# Patient Record
Sex: Female | Born: 1940 | Race: White | Hispanic: No | Smoking: Former smoker
Health system: Southern US, Community
[De-identification: ages and names within clinical notes are randomized; demographics above are authoritative.]

## PROBLEM LIST (undated history)

## (undated) DIAGNOSIS — J45909 Unspecified asthma, uncomplicated: Secondary | ICD-10-CM

## (undated) DIAGNOSIS — K219 Gastro-esophageal reflux disease without esophagitis: Secondary | ICD-10-CM

## (undated) DIAGNOSIS — E785 Hyperlipidemia, unspecified: Secondary | ICD-10-CM

## (undated) DIAGNOSIS — K5792 Diverticulitis of intestine, part unspecified, without perforation or abscess without bleeding: Secondary | ICD-10-CM

## (undated) DIAGNOSIS — D649 Anemia, unspecified: Secondary | ICD-10-CM

## (undated) DIAGNOSIS — I10 Essential (primary) hypertension: Secondary | ICD-10-CM

## (undated) DIAGNOSIS — R413 Other amnesia: Secondary | ICD-10-CM

## (undated) DIAGNOSIS — F32A Depression, unspecified: Secondary | ICD-10-CM

## (undated) DIAGNOSIS — F419 Anxiety disorder, unspecified: Secondary | ICD-10-CM

## (undated) DIAGNOSIS — F0390 Unspecified dementia without behavioral disturbance: Secondary | ICD-10-CM

## (undated) DIAGNOSIS — F0391 Unspecified dementia with behavioral disturbance: Secondary | ICD-10-CM

## (undated) DIAGNOSIS — F329 Major depressive disorder, single episode, unspecified: Secondary | ICD-10-CM

## (undated) DIAGNOSIS — G9340 Encephalopathy, unspecified: Secondary | ICD-10-CM

## (undated) DIAGNOSIS — E079 Disorder of thyroid, unspecified: Secondary | ICD-10-CM

## (undated) DIAGNOSIS — M1990 Unspecified osteoarthritis, unspecified site: Secondary | ICD-10-CM

## (undated) DIAGNOSIS — K859 Acute pancreatitis without necrosis or infection, unspecified: Secondary | ICD-10-CM

## (undated) DIAGNOSIS — J449 Chronic obstructive pulmonary disease, unspecified: Secondary | ICD-10-CM

## (undated) DIAGNOSIS — M810 Age-related osteoporosis without current pathological fracture: Secondary | ICD-10-CM

## (undated) DIAGNOSIS — K2270 Barrett's esophagus without dysplasia: Secondary | ICD-10-CM

## (undated) DIAGNOSIS — I480 Paroxysmal atrial fibrillation: Secondary | ICD-10-CM

## (undated) DIAGNOSIS — E559 Vitamin D deficiency, unspecified: Secondary | ICD-10-CM

## (undated) DIAGNOSIS — N189 Chronic kidney disease, unspecified: Secondary | ICD-10-CM

## (undated) DIAGNOSIS — R42 Dizziness and giddiness: Secondary | ICD-10-CM

## (undated) DIAGNOSIS — L409 Psoriasis, unspecified: Secondary | ICD-10-CM

## (undated) HISTORY — DX: Unspecified dementia with behavioral disturbance: F03.91

## (undated) HISTORY — DX: Gastro-esophageal reflux disease without esophagitis: K21.9

## (undated) HISTORY — DX: Anemia, unspecified: D64.9

## (undated) HISTORY — DX: Paroxysmal atrial fibrillation: I48.0

## (undated) HISTORY — PX: CHOLECYSTECTOMY: SHX55

## (undated) HISTORY — DX: Vitamin D deficiency, unspecified: E55.9

## (undated) HISTORY — DX: Barrett's esophagus without dysplasia: K22.70

## (undated) HISTORY — DX: Psoriasis, unspecified: L40.9

## (undated) HISTORY — DX: Chronic obstructive pulmonary disease, unspecified: J44.9

## (undated) HISTORY — PX: SHOULDER SURGERY: SHX246

## (undated) HISTORY — DX: Encephalopathy, unspecified: G93.40

## (undated) HISTORY — DX: Chronic kidney disease, unspecified: N18.9

## (undated) HISTORY — PX: ROTATOR CUFF REPAIR: SHX139

## (undated) HISTORY — PX: BACK SURGERY: SHX140

## (undated) HISTORY — DX: Disorder of thyroid, unspecified: E07.9

## (undated) HISTORY — DX: Age-related osteoporosis without current pathological fracture: M81.0

## (undated) HISTORY — DX: Other amnesia: R41.3

## (undated) HISTORY — DX: Anxiety disorder, unspecified: F41.9

## (undated) HISTORY — PX: OTHER SURGICAL HISTORY: SHX169

## (undated) HISTORY — DX: Essential (primary) hypertension: I10

---

## 2003-06-03 ENCOUNTER — Other Ambulatory Visit: Admission: RE | Admit: 2003-06-03 | Discharge: 2003-06-03 | Payer: Self-pay | Admitting: *Deleted

## 2007-04-14 ENCOUNTER — Encounter
Admission: RE | Admit: 2007-04-14 | Discharge: 2007-04-14 | Payer: Self-pay | Admitting: Physical Medicine and Rehabilitation

## 2007-11-01 ENCOUNTER — Emergency Department (HOSPITAL_BASED_OUTPATIENT_CLINIC_OR_DEPARTMENT_OTHER): Admission: EM | Admit: 2007-11-01 | Discharge: 2007-11-01 | Payer: Self-pay | Admitting: Emergency Medicine

## 2010-11-12 LAB — D-DIMER, QUANTITATIVE: D-Dimer, Quant: 0.33

## 2013-08-20 ENCOUNTER — Emergency Department (HOSPITAL_BASED_OUTPATIENT_CLINIC_OR_DEPARTMENT_OTHER)
Admission: EM | Admit: 2013-08-20 | Discharge: 2013-08-20 | Disposition: A | Discharge status: Home / Self Care | Payer: Medicare Other | Attending: Emergency Medicine | Admitting: Emergency Medicine

## 2013-08-20 ENCOUNTER — Encounter (HOSPITAL_BASED_OUTPATIENT_CLINIC_OR_DEPARTMENT_OTHER): Payer: Self-pay | Admitting: Emergency Medicine

## 2013-08-20 DIAGNOSIS — Z8739 Personal history of other diseases of the musculoskeletal system and connective tissue: Secondary | ICD-10-CM | POA: Insufficient documentation

## 2013-08-20 DIAGNOSIS — L0390 Cellulitis, unspecified: Secondary | ICD-10-CM

## 2013-08-20 DIAGNOSIS — L02219 Cutaneous abscess of trunk, unspecified: Secondary | ICD-10-CM | POA: Insufficient documentation

## 2013-08-20 DIAGNOSIS — L03319 Cellulitis of trunk, unspecified: Principal | ICD-10-CM

## 2013-08-20 DIAGNOSIS — Z792 Long term (current) use of antibiotics: Secondary | ICD-10-CM | POA: Insufficient documentation

## 2013-08-20 DIAGNOSIS — E785 Hyperlipidemia, unspecified: Secondary | ICD-10-CM | POA: Insufficient documentation

## 2013-08-20 DIAGNOSIS — Z79899 Other long term (current) drug therapy: Secondary | ICD-10-CM | POA: Insufficient documentation

## 2013-08-20 DIAGNOSIS — J45909 Unspecified asthma, uncomplicated: Secondary | ICD-10-CM | POA: Insufficient documentation

## 2013-08-20 DIAGNOSIS — Z791 Long term (current) use of non-steroidal anti-inflammatories (NSAID): Secondary | ICD-10-CM | POA: Insufficient documentation

## 2013-08-20 DIAGNOSIS — Z8719 Personal history of other diseases of the digestive system: Secondary | ICD-10-CM | POA: Insufficient documentation

## 2013-08-20 DIAGNOSIS — Z87891 Personal history of nicotine dependence: Secondary | ICD-10-CM | POA: Insufficient documentation

## 2013-08-20 DIAGNOSIS — L0291 Cutaneous abscess, unspecified: Secondary | ICD-10-CM

## 2013-08-20 HISTORY — DX: Unspecified asthma, uncomplicated: J45.909

## 2013-08-20 HISTORY — DX: Dizziness and giddiness: R42

## 2013-08-20 HISTORY — DX: Unspecified osteoarthritis, unspecified site: M19.90

## 2013-08-20 HISTORY — DX: Hyperlipidemia, unspecified: E78.5

## 2013-08-20 HISTORY — DX: Acute pancreatitis without necrosis or infection, unspecified: K85.90

## 2013-08-20 HISTORY — DX: Diverticulitis of intestine, part unspecified, without perforation or abscess without bleeding: K57.92

## 2013-08-20 MED ORDER — MUPIROCIN CALCIUM 2 % NA OINT: TOPICAL_OINTMENT | NASAL | Status: DC

## 2013-08-20 MED ORDER — CLINDAMYCIN HCL 150 MG PO CAPS: 300.0000 mg | ORAL_CAPSULE | Freq: Four times a day (QID) | ORAL | Status: DC

## 2013-08-20 NOTE — ED Notes (Signed)
I & D tray at the bedside set up and ready for the doctor to use.

## 2013-08-20 NOTE — Discharge Instructions (Signed)
Abscess An abscess is an infected area that contains a collection of pus and debris.It can occur in almost any part of the body. An abscess is also known as a furuncle or boil.  You should use warm compresses or soaks to the area. CAUSES  An abscess occurs when tissue gets infected. This can occur from blockage of oil or sweat glands, infection of hair follicles, or a minor injury to the skin. As the body tries to fight the infection, pus collects in the area and creates pressure under the skin. This pressure causes pain. People with weakened immune systems have difficulty fighting infections and get certain abscesses more often.  SYMPTOMS Usually an abscess develops on the skin and becomes a painful mass that is red, warm, and tender. If the abscess forms under the skin, you may feel a moveable soft area under the skin. Some abscesses break open (rupture) on their own, but most will continue to get worse without care. The infection can spread deeper into the body and eventually into the bloodstream, causing you to feel ill.  DIAGNOSIS  Your caregiver will take your medical history and perform a physical exam. A sample of fluid may also be taken from the abscess to determine what is causing your infection. TREATMENT  Your caregiver may prescribe antibiotic medicines to fight the infection. However, taking antibiotics alone usually does not cure an abscess. Your caregiver may need to make a small cut (incision) in the abscess to drain the pus. In some cases, gauze is packed into the abscess to reduce pain and to continue draining the area. HOME CARE INSTRUCTIONS   Only take over-the-counter or prescription medicines for pain, discomfort, or fever as directed by your caregiver.  If you were prescribed antibiotics, take them as directed. Finish them even if you start to feel better.  If gauze is used, follow your caregiver's directions for changing the gauze.  To avoid spreading the  infection:  Keep your draining abscess covered with a bandage.  Wash your hands well.  Do not share personal care items, towels, or whirlpools with others.  Avoid skin contact with others.  Keep your skin and clothes clean around the abscess.  Keep all follow-up appointments as directed by your caregiver. SEEK MEDICAL CARE IF:   You have increased pain, swelling, redness, fluid drainage, or bleeding.  You have muscle aches, chills, or a general ill feeling.  You have a fever. MAKE SURE YOU:   Understand these instructions.  Will watch your condition.  Will get help right away if you are not doing well or get worse. Document Released: 11/07/2004 Document Revised: 07/30/2011 Document Reviewed: 04/12/2011 [facility] Patient Information 2015 [employer] [place]. This information is not intended to replace advice given to you by your health care provider. Make sure you discuss any questions you have with your health care provider.  Cellulitis Cellulitis is an infection of the skin and the tissue under the skin. The infected area is usually red and tender. This happens most often in the arms and lower legs. HOME CARE   Take your antibiotic medicine as told. Finish the medicine even if you start to feel better.  Keep the infected arm or leg raised (elevated).  Put a warm cloth on the area up to 4 times per day.  Only take medicines as told by your doctor.  Keep all doctor visits as told. GET HELP RIGHT AWAY IF:   You have a fever.  You feel very sleepy.  You throw up (vomit) or have watery poop (diarrhea).  You feel sick and have muscle aches and pains.  You see red streaks on the skin coming from the infected area.  Your red area gets bigger or turns a dark color.  Your bone or joint under the infected area is painful after the skin heals.  Your infection comes back in the same area or different area.  You have a puffy (swollen) bump in the infected area.  You have  new symptoms. MAKE SURE YOU:   Understand these instructions.  Will watch your condition.  Will get help right away if you are not doing well or get worse. Document Released: 07/17/2007 Document Revised: 07/30/2011 Document Reviewed: 04/15/2011 [facility] Patient Information 2015 [place], [place]. This information is not intended to replace advice given to you by your health care provider. Make sure you discuss any questions you have with your health care provider.

## 2013-08-20 NOTE — ED Notes (Signed)
Pt c/o several abscesses x 2 weeks

## 2013-08-20 NOTE — ED Provider Notes (Signed)
CSN: [id number]     Arrival date & time 08/20/13  1306 History   First MD Initiated Contact with Patient 08/20/13 1325     Chief Complaint  Patient presents with  . Abscess     (Consider location/radiation/quality/duration/timing/severity/associated sxs/prior Treatment) HPI  This is a 73 year old female with history of hyperlipidemia, asthma who presents with multiple abscesses over the last one week. No prior history of the same. Patient reports that she initially had a spot on her lip which she treated conservatively. She now states that she has a spot on her back and her labia. They appear to be getting bigger.  Patient denies any systemic symptoms including fever. She has associated pain and redness to the site. She's not tried anything for them.  Past Medical History  Diagnosis Date  . Hyperlipemia   . Asthma   . Diverticulitis   . Arthritis   . Vertigo   . Pancreatitis    Past Surgical History  Procedure Laterality Date  . Brain shun    . Rotator cuff repair     History reviewed. No pertinent family history. History  Substance Use Topics  . Smoking status: Former Games developermoker  . Smokeless tobacco: Not on file  . Alcohol Use: No   OB History   Grav Para Term Preterm Abortions TAB SAB Ect Mult Living                 Review of Systems  Constitutional: Negative for fever.  Skin:       Multiple abscess  All other systems reviewed and are negative.     Allergies  Cephalexin; Cephalosporins; Ciprofloxacin; Darvon; Flagyl; Librax; Macrodantin; Percocet; and Sulfa antibiotics  Home Medications   Prior to Admission medications   Medication Sig Start Date End Date Taking? Authorizing Provider  amoxicillin-clavulanate (AUGMENTIN) 875-125 MG per tablet Take 1 tablet by mouth 2 (two) times daily.   Yes Historical Provider, MD  meclizine (ANTIVERT) 25 MG tablet Take 25 mg by mouth 3 (three) times daily as needed for dizziness.   Yes Historical Provider, MD  meloxicam  (MOBIC) 7.5 MG tablet Take 7.5 mg by mouth daily.   Yes Historical Provider, MD  pantoprazole (PROTONIX) 40 MG tablet Take 40 mg by mouth daily.   Yes Historical Provider, MD  simvastatin (ZOCOR) 20 MG tablet Take 20 mg by mouth daily.   Yes Historical Provider, MD  theophylline (THEODUR) 300 MG 12 hr tablet Take 300 mg by mouth 2 (two) times daily.   Yes Historical Provider, MD  clindamycin (CLEOCIN) 150 MG capsule Take 2 capsules (300 mg total) by mouth every 6 (six) hours. 08/20/13   Shon Batonourtney F Horton, MD  mupirocin nasal ointment (BACTROBAN) 2 % Apply in each nostril daily 08/20/13   Shon Batonourtney F Horton, MD   BP 98/56  Pulse 87  Temp(Src) 97.8 F (36.6 C)  Resp 20  Ht 5\' 2"  (1.575 m)  Wt 165 lb (74.844 kg)  BMI 30.17 kg/m2  SpO2 96% Physical Exam  Nursing note and vitals reviewed. Constitutional: She is oriented to person, place, and time. No distress.  Elderly  HENT:  Head: Normocephalic and atraumatic.  Mouth/Throat: Oropharynx is clear and moist.  Cardiovascular: Normal rate, regular rhythm and normal heart sounds.   Pulmonary/Chest: Effort normal and breath sounds normal. No respiratory distress. She has no wheezes.  Abdominal: Soft.  Genitourinary:  Quarter-sized area of redness and induration without fluctuance over the left labia  Neurological: She is alert  and oriented to person, place, and time.  Skin: Skin is warm and dry.  Pustule noted over the back with associated fluctuance or erythema, erythema extends approximately 6 x 3 cm  Psychiatric: She has a normal mood and affect.    ED Course  INCISION AND DRAINAGE Date/Time: 08/21/2013 7:04 AM Performed by: Ross Marcus, F Authorized by: Ross Marcus, F Consent: Verbal consent obtained. Risks and benefits: risks, benefits and alternatives were discussed Consent given by: patient Patient identity confirmed: verbally with patient Type: abscess Body area: trunk Location details: back Scalpel size:  11 Incision type: single straight Complexity: simple Drainage: purulent Drainage amount: moderate Wound treatment: wound left open Patient tolerance: Patient tolerated the procedure well with no immediate complications.   (including critical care time) Labs Review Labs Reviewed - No data to display  Imaging Review No results found.   EKG Interpretation None      MDM   Final diagnoses:  Abscess and cellulitis    Patient presents with multiple areas of redness and induration. She is nontoxic-appearing. She's afebrile. She appears to have abscesses come up over her right back. Region over her left labia appears more cellulitic at this time. Abscess was drained appropriately. Patient will be given a course of clindamycin given the associated cellulitis with both lesions. Patient will also be given Bactroban given occurrence of multiple abscesses over the last week. She was given strict return precautions. She is to use sitz bath and warm compresses.  After history, exam, and medical workup I feel the patient has been appropriately medically screened and is safe for discharge home. Pertinent diagnoses were discussed with the patient. Patient was given return precautions.    Shon Baton, MD 08/21/13 [phone]

## 2013-08-20 NOTE — ED Notes (Signed)
MD at bedside. 

## 2015-09-14 LAB — HM DEXA SCAN

## 2015-12-19 HISTORY — PX: UPPER GI ENDOSCOPY: SHX6162

## 2016-01-26 ENCOUNTER — Encounter (HOSPITAL_BASED_OUTPATIENT_CLINIC_OR_DEPARTMENT_OTHER): Payer: Self-pay | Admitting: *Deleted

## 2016-01-26 ENCOUNTER — Emergency Department (HOSPITAL_BASED_OUTPATIENT_CLINIC_OR_DEPARTMENT_OTHER): Payer: Medicare Other

## 2016-01-26 ENCOUNTER — Emergency Department (HOSPITAL_BASED_OUTPATIENT_CLINIC_OR_DEPARTMENT_OTHER)
Admission: EM | Admit: 2016-01-26 | Discharge: 2016-01-27 | Payer: Medicare Other | Attending: Emergency Medicine | Admitting: Emergency Medicine

## 2016-01-26 DIAGNOSIS — Z79899 Other long term (current) drug therapy: Secondary | ICD-10-CM | POA: Diagnosis not present

## 2016-01-26 DIAGNOSIS — E876 Hypokalemia: Secondary | ICD-10-CM | POA: Diagnosis not present

## 2016-01-26 DIAGNOSIS — Z87891 Personal history of nicotine dependence: Secondary | ICD-10-CM | POA: Insufficient documentation

## 2016-01-26 DIAGNOSIS — J45909 Unspecified asthma, uncomplicated: Secondary | ICD-10-CM | POA: Diagnosis not present

## 2016-01-26 DIAGNOSIS — K859 Acute pancreatitis without necrosis or infection, unspecified: Secondary | ICD-10-CM | POA: Diagnosis not present

## 2016-01-26 DIAGNOSIS — R1030 Lower abdominal pain, unspecified: Secondary | ICD-10-CM | POA: Diagnosis present

## 2016-01-26 HISTORY — DX: Depression, unspecified: F32.A

## 2016-01-26 HISTORY — DX: Major depressive disorder, single episode, unspecified: F32.9

## 2016-01-26 HISTORY — DX: Unspecified dementia without behavioral disturbance: F03.90

## 2016-01-26 LAB — URINALYSIS, ROUTINE W REFLEX MICROSCOPIC
Bilirubin Urine: NEGATIVE
Glucose, UA: NEGATIVE mg/dL
Hgb urine dipstick: NEGATIVE
Ketones, ur: NEGATIVE mg/dL
Leukocytes, UA: NEGATIVE
Nitrite: NEGATIVE
Protein, ur: NEGATIVE mg/dL
Specific Gravity, Urine: 1.03 (ref 1.005–1.030)
pH: 7 (ref 5.0–8.0)

## 2016-01-26 LAB — BASIC METABOLIC PANEL
Anion gap: 9 (ref 5–15)
BUN: 10 mg/dL (ref 6–20)
CO2: 30 mmol/L (ref 22–32)
Calcium: 9.6 mg/dL (ref 8.9–10.3)
Chloride: 100 mmol/L — ABNORMAL LOW (ref 101–111)
Creatinine, Ser: 0.83 mg/dL (ref 0.44–1.00)
GFR calc Af Amer: >60 mL/min (ref >60)
GFR calc non Af Amer: >60 mL/min (ref >60)
Glucose, Bld: 115 mg/dL — ABNORMAL HIGH (ref 65–99)
Potassium: 2.3 mmol/L — CL (ref 3.5–5.1)
Sodium: 139 mmol/L (ref 135–145)

## 2016-01-26 LAB — CBC WITH DIFFERENTIAL/PLATELET
Basophils Absolute: 0 10*3/uL (ref 0.0–0.1)
Basophils Relative: 0 %
Eosinophils Absolute: 0.2 10*3/uL (ref 0.0–0.7)
Eosinophils Relative: 2 %
HCT: 34.6 % — ABNORMAL LOW (ref 36.0–46.0)
Hemoglobin: 11.8 g/dL — ABNORMAL LOW (ref 12.0–15.0)
Lymphocytes Relative: 16 %
Lymphs Abs: 1.3 10*3/uL (ref 0.7–4.0)
MCH: 32.9 pg (ref 26.0–34.0)
MCHC: 34.1 g/dL (ref 30.0–36.0)
MCV: 96.4 fL (ref 78.0–100.0)
Monocytes Absolute: 1 10*3/uL (ref 0.1–1.0)
Monocytes Relative: 12 %
Neutro Abs: 5.7 10*3/uL (ref 1.7–7.7)
Neutrophils Relative %: 70 %
Platelets: 286 10*3/uL (ref 150–400)
RBC: 3.59 MIL/uL — ABNORMAL LOW (ref 3.87–5.11)
RDW: 15.2 % (ref 11.5–15.5)
WBC: 8.1 10*3/uL (ref 4.0–10.5)

## 2016-01-26 LAB — LIPASE, BLOOD: Lipase: 319 U/L — ABNORMAL HIGH (ref 11–51)

## 2016-01-26 LAB — MAGNESIUM: Magnesium: 2.2 mg/dL (ref 1.7–2.4)

## 2016-01-26 MED ORDER — ONDANSETRON HCL 4 MG/2ML IJ SOLN
4.0000 mg | Freq: Once | INTRAMUSCULAR | Status: AC
  Administered 2016-01-26: 4 mg via INTRAVENOUS
  Filled 2016-01-26: qty 2

## 2016-01-26 MED ORDER — MORPHINE SULFATE (PF) 4 MG/ML IV SOLN: 4.0000 mg | Freq: Once | INTRAVENOUS | Status: DC

## 2016-01-26 MED ORDER — POTASSIUM CHLORIDE CRYS ER 20 MEQ PO TBCR
60.0000 meq | EXTENDED_RELEASE_TABLET | Freq: Once | ORAL | Status: AC
  Administered 2016-01-26: 60 meq via ORAL
  Filled 2016-01-26: qty 3

## 2016-01-26 MED ORDER — IOPAMIDOL (ISOVUE-300) INJECTION 61%
100.0000 mL | Freq: Once | INTRAVENOUS | Status: AC | PRN
  Administered 2016-01-26: 100 mL via INTRAVENOUS

## 2016-01-26 MED ORDER — SODIUM CHLORIDE 0.9 % IV BOLUS (SEPSIS)
1000.0000 mL | Freq: Once | INTRAVENOUS | Status: AC
  Administered 2016-01-26: 1000 mL via INTRAVENOUS

## 2016-01-26 MED ORDER — POTASSIUM CHLORIDE 10 MEQ/100ML IV SOLN
10.0000 meq | INTRAVENOUS | Status: AC
  Administered 2016-01-26 (×2): 10 meq via INTRAVENOUS
  Filled 2016-01-26 (×2): qty 100

## 2016-01-26 NOTE — ED Triage Notes (Signed)
Abdominal pain. Possible constipation. Vomited.

## 2016-01-26 NOTE — ED Notes (Signed)
Attempt[facility] to give [facility] but was unable to do so. When nurse is available will call back for report.

## 2016-01-26 NOTE — ED Provider Notes (Signed)
[facility] Provider Note   CSN: [id number] Arrival date & time: 01/26/16  1819  By signing my name below, I, [name], attest that this documentation has been prepared under the direction and in the presence of Raeford RazorStephen Denia Mcvicar, MD. Electronically Signed: [name], ED Scribe. 01/26/16. 7:05 PM.    History   Chief Complaint Chief Complaint  Patient presents with  . Abdominal Pain    HPI Rachel MassedShirley M Dickerson is a 75 y.o. female who presents to the Emergency Department complaining of moderate, constant lower abdominal pain onset this morning with associated lower back pain, constipation, nausea, multiple episodes of emesis. Per family, pts emesis was green in coloration. No worsening or alleviating factors noted. Last bowel movement was very small earlier this afternoon. Pt has h/o gallbladder surgery, but no additional abdominal surgeries. Pt denies taking OTC medications at home to improve symptoms. She denies dysuria, hematuria, frequency, urgency, diarrhea, fever.   The history is provided by the patient and a relative. No language interpreter was used.    Past Medical History:  Diagnosis Date  . Arthritis   . Asthma   . Dementia   . Depression   . Diverticulitis   . Hyperlipemia   . Pancreatitis   . Vertigo     There are no active problems to display for this patient.   Past Surgical History:  Procedure Laterality Date  . brain shun    . ROTATOR CUFF REPAIR      OB History    No data available       Home Medications    Prior to Admission medications   Medication Sig Start Date End Date Taking? Authorizing Provider  albuterol (PROVENTIL HFA;VENTOLIN HFA) 108 (90 Base) MCG/ACT inhaler Inhale into the lungs every 6 (six) hours as needed for wheezing or shortness of breath.   Yes Historical Provider, MD  Cholecalciferol (D3-1000) 1000 units tablet Take 1,000 Units by mouth daily.   Yes Historical Provider, MD  clonazePAM (KLONOPIN) 2 MG tablet Take  2 mg by mouth 2 (two) times daily.   Yes Historical Provider, MD  divalproex (DEPAKOTE) 250 MG DR tablet Take 250 mg by mouth 3 (three) times daily.   Yes Historical Provider, MD  donepezil (ARICEPT) 10 MG tablet Take 10 mg by mouth at bedtime.   Yes Historical Provider, MD  fenofibrate 160 MG tablet Take 160 mg by mouth daily.   Yes Historical Provider, MD  levothyroxine (SYNTHROID, LEVOTHROID) 112 MCG tablet Take 112 mcg by mouth daily before breakfast.   Yes Historical Provider, MD  meclizine (ANTIVERT) 25 MG tablet Take 25 mg by mouth 3 (three) times daily as needed for dizziness.   Yes Historical Provider, MD  oxybutynin (DITROPAN) 5 MG tablet Take 5 mg by mouth 3 (three) times daily.   Yes Historical Provider, MD  pantoprazole (PROTONIX) 40 MG tablet Take 40 mg by mouth daily.   Yes Historical Provider, MD  ranitidine (ZANTAC) 75 MG tablet Take 75 mg by mouth 2 (two) times daily.   Yes Historical Provider, MD  sertraline (ZOLOFT) 50 MG tablet Take 50 mg by mouth daily.   Yes Historical Provider, MD  theophylline (THEODUR) 300 MG 12 hr tablet Take 300 mg by mouth 2 (two) times daily.   Yes Historical Provider, MD  venlafaxine (EFFEXOR) 37.5 MG tablet Take 37.5 mg by mouth 2 (two) times daily.   Yes Historical Provider, MD    Family History No family history on file.  Social  History Social History  Substance Use Topics  . Smoking status: Former Games developer  . Smokeless tobacco: Never Used  . Alcohol use No     Allergies   Cephalexin; Cephalosporins; Ciprofloxacin; Darvon [propoxyphene]; Flagyl [metronidazole]; Librax [chlordiazepoxide-clidinium]; Macrodantin [nitrofurantoin macrocrystal]; Percocet [oxycodone-acetaminophen]; and Sulfa antibiotics   Review of Systems Review of Systems  Constitutional: Negative for fever.  Gastrointestinal: Positive for abdominal pain, constipation, nausea and vomiting. Negative for diarrhea.  Genitourinary: Negative for dysuria, frequency, hematuria  and urgency.  Musculoskeletal: Positive for back pain.  All other systems reviewed and are negative.    Physical Exam Updated Vital Signs BP 128/78   Pulse 80   Temp 97.9 F (36.6 C) (Oral)   Resp 18   Ht 5' (1.524 m)   Wt 165 lb (74.8 kg)   SpO2 96%   BMI 32.22 kg/m   Physical Exam  Constitutional: She is oriented to person, place, and time. She appears well-developed and well-nourished. No distress.  HENT:  Head: Normocephalic and atraumatic.  Eyes: EOM are normal.  Neck: Normal range of motion.  Cardiovascular: Normal rate, regular rhythm and normal heart sounds.   Pulmonary/Chest: Effort normal and breath sounds normal.  Abdominal: Soft. She exhibits no distension. There is tenderness.  Mild right sided abdominal tenderness.   Musculoskeletal: Normal range of motion.  Neurological: She is alert and oriented to person, place, and time.  Skin: Skin is warm and dry.  Psychiatric: She has a normal mood and affect. Judgment normal.  Nursing note and vitals reviewed.    ED Treatments / Results   DIAGNOSTIC STUDIES: Oxygen Saturation is 96% on RA, adequate by my interpretation.    COORDINATION OF CARE: 6:49 PM Discussed treatment plan with pt at bedside which includes CT, lab work and pt agreed to plan.    Labs (all labs ordered are listed, but only abnormal results are displayed) Labs Reviewed  CBC WITH DIFFERENTIAL/PLATELET - Abnormal; Notable for the following:       Result Value   RBC 3.59 (*)    Hemoglobin 11.8 (*)    HCT 34.6 (*)    All other components within normal limits  BASIC METABOLIC PANEL - Abnormal; Notable for the following:    Potassium 2.3 (*)    Chloride 100 (*)    Glucose, Bld 115 (*)    All other components within normal limits  LIPASE, BLOOD - Abnormal; Notable for the following:    Lipase 319 (*)    All other components within normal limits  URINALYSIS, ROUTINE W REFLEX MICROSCOPIC  MAGNESIUM    EKG  EKG  Interpretation  Date/Time:  Friday January 26 2016 20:18:40 EST Ventricular Rate:  78 PR Interval:    QRS Duration: 97 QT Interval:  452 QTC Calculation: 515 R Axis:   25 Text Interpretation:  Sinus arrhythmia Minimal ST depression, lateral leads Prolonged QT interval Confirmed by Juleen China  MD, Jeannett Senior ([id number]) on 01/26/2016 8:57:22 PM Also confirmed by Juleen China  MD, Douglass Dunshee [phone]), editor [place] CT, Jola Babinski [phone])  on 01/27/2016 9:56:14 AM       Radiology No results found.   Ct Abdomen Pelvis W Contrast  Result Date: 01/26/2016 CLINICAL DATA:  Lower abdominal pain and low back pain EXAM: CT ABDOMEN AND PELVIS WITH CONTRAST TECHNIQUE: Multidetector CT imaging of the abdomen and pelvis was performed using the standard protocol following bolus administration of intravenous contrast. CONTRAST:  ISOVUE-300 IOPAMIDOL (ISOVUE-300) INJECTION 61% COMPARISON:  Chest radiograph 11/01/2007 FINDINGS: Lower chest: No pulmonary  nodules. No visible pleural or pericardial effusion. Hepatobiliary: Normal hepatic size and contours without focal liver lesion. No perihepatic ascites. No intra- or extrahepatic biliary dilatation. Status post cholecystectomy. Pancreas: There is inflammatory stranding surrounding the head of the pancreas. No peripancreatic fluid collection. The pancreatic duct is not dilated. Spleen: Normal. Adrenals/Urinary Tract: Normal adrenal glands. No hydronephrosis or solid renal mass. Stomach/Bowel: There is descending colonic and sigmoid diverticulosis without evidence of acute inflammation. No dilated small bowel. No abdominal fluid collection. Normal appendix. Vascular/Lymphatic: There is atherosclerotic calcification of the non aneurysmal abdominal aorta. No abdominal or pelvic adenopathy. Reproductive: Normal uterus and ovaries for age. Musculoskeletal: There is compression deformity of the T8 vertebral body, favored to be chronic. This appears unchanged relative to the radiograph of  11/01/2007 Normal visualized extrathoracic and extraperitoneal soft tissues. Other: No contributory non-categorized findings. IMPRESSION: 1. Acute pancreatitis without evidence of pancreatic necrosis or peripancreatic fluid collection. 2. Aortic atherosclerosis. 3. Descending colonic and sigmoid diverticulosis without acute diverticulitis. Electronically Signed   By: Deatra RobinsonKevin  Herman M.D.   On: 01/26/2016 21:57    Procedures Procedures (including critical care time)  Medications Ordered in ED Medications - No data to display   Initial Impression / Assessment and Plan / ED Course  I have reviewed the triage vital signs and the nursing notes.  Pertinent labs & imaging results that were available during my care of the patient were reviewed by me and considered in my medical decision making (see chart for details).  Clinical Course     75yF with pancreatitis. S/p cholecystectomy. Doesn't drink. Hx of hyperlipidemia. Pain improved but still feels sniffily nauseated. Incidentally noted pretty severe hypokalemia. Magnesium is normal. Replete. Admission. [facility]  Final Clinical Impressions(s) / ED Diagnoses   Final diagnoses:  Acute pancreatitis, unspecified complication status, unspecified pancreatitis type  Hypokalemia    New Prescriptions New Prescriptions   No medications on file   I personally preformed the services scribed in my presence. The recorded information has been reviewed is accurate. Raeford RazorStephen Briyanna Billingham, MD.    Raeford RazorStephen Kwynn Schlotter, MD 01/31/16 [phone]

## 2016-01-26 NOTE — ED Notes (Signed)
Patient denies pain and is resting comfortably.  

## 2016-01-26 NOTE — ED Notes (Signed)
ED Provider at bedside. 

## 2016-01-26 NOTE — ED Notes (Signed)
States has had stomach pain and lower back pain today. Denies urinary s/s, small BM today, hx of consitpation and vomited x 2 this am

## 2016-01-26 NOTE — ED Notes (Signed)
Declines morphine at present, resting quietly, dozes at intervels.

## 2016-02-02 LAB — CBC AND DIFFERENTIAL
HEMATOCRIT: 37 % (ref 36–46)
Hemoglobin: 12.4 g/dL (ref 12.0–16.0)
Platelets: 309 10*3/uL (ref 150–399)
WBC: 6 10^3/mL

## 2016-02-02 LAB — HEPATIC FUNCTION PANEL
ALK PHOS: 54 U/L (ref 25–125)
ALT: 30 U/L (ref 7–35)
AST: 51 U/L — AB (ref 13–35)
BILIRUBIN, TOTAL: 0.4 mg/dL

## 2016-02-02 LAB — BASIC METABOLIC PANEL
BUN: 8 mg/dL (ref 4–21)
CREATININE: 0.9 mg/dL (ref 0.5–1.1)
GLUCOSE: 132 mg/dL
Potassium: 3.5 mmol/L (ref 3.4–5.3)
SODIUM: 145 mmol/L (ref 137–147)

## 2016-02-05 LAB — HEPATIC FUNCTION PANEL
ALT: 21 U/L (ref 7–35)
AST: 29 U/L (ref 13–35)
Alkaline Phosphatase: 58 U/L (ref 25–125)
Bilirubin, Total: 0.4 mg/dL

## 2016-02-10 LAB — BASIC METABOLIC PANEL
BUN: 5 mg/dL (ref 4–21)
CREATININE: 0.7 mg/dL (ref 0.5–1.1)
POTASSIUM: 3.1 mmol/L — AB (ref 3.4–5.3)
SODIUM: 140 mmol/L (ref 137–147)

## 2016-02-10 LAB — CBC AND DIFFERENTIAL
HCT: 39 % (ref 36–46)
Hemoglobin: 13 g/dL (ref 12.0–16.0)
PLATELETS: 315 10*3/uL (ref 150–399)
WBC: 6.5 10^3/mL

## 2016-02-13 ENCOUNTER — Encounter: Payer: Self-pay | Admitting: Internal Medicine

## 2016-02-13 ENCOUNTER — Non-Acute Institutional Stay (SKILLED_NURSING_FACILITY): Payer: Medicare Other | Admitting: Internal Medicine

## 2016-02-13 DIAGNOSIS — F29 Unspecified psychosis not due to a substance or known physiological condition: Secondary | ICD-10-CM

## 2016-02-13 DIAGNOSIS — K2100 Gastro-esophageal reflux disease with esophagitis, without bleeding: Secondary | ICD-10-CM

## 2016-02-13 DIAGNOSIS — E039 Hypothyroidism, unspecified: Secondary | ICD-10-CM | POA: Insufficient documentation

## 2016-02-13 DIAGNOSIS — N3281 Overactive bladder: Secondary | ICD-10-CM | POA: Diagnosis not present

## 2016-02-13 DIAGNOSIS — F419 Anxiety disorder, unspecified: Secondary | ICD-10-CM

## 2016-02-13 DIAGNOSIS — N189 Chronic kidney disease, unspecified: Secondary | ICD-10-CM | POA: Insufficient documentation

## 2016-02-13 DIAGNOSIS — G9340 Encephalopathy, unspecified: Secondary | ICD-10-CM

## 2016-02-13 DIAGNOSIS — E034 Atrophy of thyroid (acquired): Secondary | ICD-10-CM | POA: Diagnosis not present

## 2016-02-13 DIAGNOSIS — F0281 Dementia in other diseases classified elsewhere with behavioral disturbance: Secondary | ICD-10-CM | POA: Diagnosis not present

## 2016-02-13 DIAGNOSIS — F02818 Dementia in other diseases classified elsewhere, unspecified severity, with other behavioral disturbance: Secondary | ICD-10-CM

## 2016-02-13 DIAGNOSIS — F03918 Unspecified dementia, unspecified severity, with other behavioral disturbance: Secondary | ICD-10-CM

## 2016-02-13 DIAGNOSIS — R748 Abnormal levels of other serum enzymes: Secondary | ICD-10-CM

## 2016-02-13 DIAGNOSIS — K219 Gastro-esophageal reflux disease without esophagitis: Secondary | ICD-10-CM | POA: Insufficient documentation

## 2016-02-13 DIAGNOSIS — J449 Chronic obstructive pulmonary disease, unspecified: Secondary | ICD-10-CM

## 2016-02-13 DIAGNOSIS — F0390 Unspecified dementia without behavioral disturbance: Secondary | ICD-10-CM | POA: Insufficient documentation

## 2016-02-13 DIAGNOSIS — D649 Anemia, unspecified: Secondary | ICD-10-CM | POA: Insufficient documentation

## 2016-02-13 DIAGNOSIS — F0391 Unspecified dementia with behavioral disturbance: Secondary | ICD-10-CM

## 2016-02-13 DIAGNOSIS — M810 Age-related osteoporosis without current pathological fracture: Secondary | ICD-10-CM | POA: Insufficient documentation

## 2016-02-13 DIAGNOSIS — K2270 Barrett's esophagus without dysplasia: Secondary | ICD-10-CM | POA: Insufficient documentation

## 2016-02-13 DIAGNOSIS — K22719 Barrett's esophagus with dysplasia, unspecified: Secondary | ICD-10-CM | POA: Diagnosis not present

## 2016-02-13 DIAGNOSIS — G301 Alzheimer's disease with late onset: Secondary | ICD-10-CM | POA: Diagnosis not present

## 2016-02-13 DIAGNOSIS — R413 Other amnesia: Secondary | ICD-10-CM | POA: Insufficient documentation

## 2016-02-13 DIAGNOSIS — L409 Psoriasis, unspecified: Secondary | ICD-10-CM | POA: Insufficient documentation

## 2016-02-13 DIAGNOSIS — I10 Essential (primary) hypertension: Secondary | ICD-10-CM | POA: Insufficient documentation

## 2016-02-13 HISTORY — DX: Unspecified dementia with behavioral disturbance: F03.91

## 2016-02-13 HISTORY — DX: Unspecified dementia, unspecified severity, with other behavioral disturbance: F03.918

## 2016-02-13 HISTORY — DX: Encephalopathy, unspecified: G93.40

## 2016-02-13 NOTE — Progress Notes (Signed)
: Provider:  Randon Goldsmith. Rachel Hollingshead, MD Location:  [facility] Room Number: [phone] Place of Service:  SNF [phone])  PCP: Rachel Cleverly, PA Patient Care Team: Rachel Dickerson, [place] as PCP - General [facility])  Extended Emergency Contact Information Primary Emergency Contact: [name] [phone] [name] of [place] Home Phone: [phone] Relation: Niece Secondary Emergency Contact: Dorice Lamas [place] of [place] Home Phone: [phone] Relation: Niece     Allergies: Azithromycin; Cephalexin; Cephalosporins; Ciprofloxacin; Darvon [propoxyphene]; Flagyl [metronidazole]; Librax [chlordiazepoxide-clidinium]; Macrodantin [nitrofurantoin macrocrystal]; Nitrofuran derivatives; Other; Oxycodone-acetaminophen; Percocet [oxycodone-acetaminophen]; Quetiapine; Sulfa antibiotics; and Sulfamethoxazole  Chief Complaint  Patient presents with  . New Admit To SNF    Admit to Facility    HPI: Patient is 76 y.o. female with dementia and hypothyroidism , recently d/c after pancreatitis was admitted to [facility] from 12/22-30 for confusion and hallucinations which family timed from early December when pt started zoloft. Neuro saw pt on 12/20 and felt confusion was dementia with sundowning. Family wanted zoloft stopped, and it was. Effexor was reduced and klonopin d/c. Family wanted Hospice but pt was not appropriate so pt is admitted to SNF for OT/PT and possibly home Hospice after.While at SNF pt will be followed for COPD, tx with proair and theophyline, hypothyroidism, tx with synthroid and GERD tx with protonix and zantac   Past Medical History:  Diagnosis Date  . Anemia   . Anxiety   . Arthritis   . Asthma   . Barrett's esophagus   . Chronic kidney disease   . COPD (chronic obstructive pulmonary disease) (HCC)   . Dementia   . Depression   . Disease of thyroid gland   . Diverticulitis   . GERD (gastroesophageal reflux disease)   .  Hyperlipemia   . Hypertension   . Memory difficulties   . Osteoporosis   . Pancreatitis   . Psoriasis (a type of skin inflammation)   . Vertigo     Past Surgical History:  Procedure Laterality Date  . BACK SURGERY    . brain shun    . CHOLECYSTECTOMY    . ROTATOR CUFF REPAIR    . SHOULDER SURGERY    . UPPER GI ENDOSCOPY  12/19/2015   [facility], Include esophagus, stomach & duodenum or / Jejunum: Dx w/wo biopsys and dilatation; Surgeon : Leonia Reeves Rhoton MD    Allergies as of 02/13/2016      Reactions   Azithromycin Other (See Comments)   unknown   Cephalexin Other (See Comments)   unknown   Cephalosporins    Ciprofloxacin Other (See Comments)   unknown   Darvon [propoxyphene]    Flagyl [metronidazole] Other (See Comments)   unknown   Librax [chlordiazepoxide-clidinium] Other (See Comments)   unknown   Macrodantin [nitrofurantoin Macrocrystal]    Nitrofuran Derivatives Other (See Comments)   unknown   Other Other (See Comments)   unknown   Oxycodone-acetaminophen Other (See Comments)   unknown   Percocet [oxycodone-acetaminophen]    Quetiapine Other (See Comments)   unknown Potassium level dropped.   Sulfa Antibiotics    Sulfamethoxazole Other (See Comments)   unknown      Medication List       Accurate as of 02/13/16 10:22 AM. Always use your most recent med list.          acetaminophen 325 MG tablet Commonly known as:  TYLENOL Take 650 mg by  mouth every 8 (eight) hours as needed.   albuterol 108 (90 Base) MCG/ACT inhaler Commonly known as:  PROVENTIL HFA;VENTOLIN HFA Inhale into the lungs every 6 (six) hours as needed for wheezing or shortness of breath.   CEREFOLIN NAC 6-2-600 MG Tabs Take 1 tablet by mouth daily.   D3-1000 1000 units tablet Generic drug:  Cholecalciferol Take 1,000 Units by mouth daily.   divalproex 250 MG DR tablet Commonly known as:  DEPAKOTE Take 250 mg by mouth 2 (two) times daily.   donepezil 10 MG tablet Commonly  known as:  ARICEPT Take 10 mg by mouth at bedtime.   levothyroxine 112 MCG tablet Commonly known as:  SYNTHROID, LEVOTHROID Take 112 mcg by mouth daily before breakfast. 6 am   OLANZapine 5 MG tablet Commonly known as:  ZYPREXA Take 5 mg by mouth at bedtime.   oxybutynin 5 MG tablet Commonly known as:  DITROPAN Take 5 mg by mouth every evening.   pantoprazole 40 MG tablet Commonly known as:  PROTONIX Take 40 mg by mouth 2 (two) times daily.   ranitidine 75 MG tablet Commonly known as:  ZANTAC Take 75 mg by mouth at bedtime.   theophylline 300 MG 12 hr tablet Commonly known as:  THEODUR Take 300 mg by mouth every 12 (twelve) hours.   venlafaxine 37.5 MG tablet Commonly known as:  EFFEXOR Take 37.5 mg by mouth daily.       Meds ordered this encounter  Medications  . acetaminophen (TYLENOL) 325 MG tablet    Sig: Take 650 mg by mouth every 8 (eight) hours as needed.  . Methylfol-Methylcob-Acetylcyst (CEREFOLIN NAC) 6-2-600 MG TABS    Sig: Take 1 tablet by mouth daily.  Marland Kitchen OLANZapine (ZYPREXA) 5 MG tablet    Sig: Take 5 mg by mouth at bedtime.    Immunization History  Administered Date(s) Administered  . Influenza, High Dose Seasonal PF 12/20/2014  . Influenza, Seasonal, Injecte, Preservative Fre 10/10/2014  . Influenza-Unspecified 12/07/2015  . PPD Test 02/10/2016  . Pneumococcal Conjugate-13 12/20/2014  . Td 08/26/2014    Social History  Substance Use Topics  . Smoking status: Former Smoker    Quit date: 12/17/1990  . Smokeless tobacco: Never Used  . Alcohol use No    Family history is   Family History  Problem Relation Age of Onset  . Arthritis Mother   . Heart disease Mother   . Hypertension Mother   . Cancer Father   . Arthritis Father   . Heart disease Father   . Alzheimer's disease Sister   . Arthritis Sister   . Heart disease Sister   . Memory loss Sister   . Cancer Brother   . Lung disease Brother   . Heart disease Brother        Review of Systems  DATA OBTAINED: from patient, nurse GENERAL:  no fevers, fatigue, appetite changes SKIN: No itching, or rash EYES: No eye pain, redness, discharge EARS: No earache, tinnitus, change in hearing NOSE: No congestion, drainage or bleeding  MOUTH/THROAT: No mouth or tooth pain, No sore throat RESPIRATORY: No cough, wheezing, SOB; pt uses proair regularly BID CARDIAC: No chest pain, palpitations, lower extremity edema  GI: No abdominal pain, No N/V/D or constipation, No heartburn or reflux  GU: No dysuria, frequency or urgency, or incontinence  MUSCULOSKELETAL: No unrelieved bone/joint pain NEUROLOGIC: No headache, dizziness or focal weakness PSYCHIATRIC: anxiety in am; and at night pt has been on Klonopin 2 mg qHS for  Years; effexor was redux=ced fro 37.5 mg 3 tabs daily to 1 tab daily  Vitals:   02/13/16 0940  BP: 122/74  Pulse: (!) 58  Resp: (!) 22  Temp: 98.1 F (36.7 C)    SpO2 Readings from Last 1 Encounters:  02/13/16 94%   Body mass index is 27.9 kg/m.     Physical Exam  GENERAL APPEARANCE: Alert, conversant,  No acute distress.  SKIN: No diaphoresis rash HEAD: Normocephalic, atraumatic  EYES: Conjunctiva/lids clear. Pupils round, reactive. EOMs intact.  EARS: External exam WNL, canals clear. Hearing grossly normal.  NOSE: No deformity or discharge.  MOUTH/THROAT: Lips w/o lesions  RESPIRATORY: Breathing is even, unlabored. Lung sounds are clear   CARDIOVASCULAR: Heart RRR no murmurs, rubs or gallops. No peripheral edema.   GASTROINTESTINAL: Abdomen is soft, non-tender, not distended w/ normal bowel sounds. GENITOURINARY: Bladder non tender, not distended  MUSCULOSKELETAL: No abnormal joints or musculature NEUROLOGIC:  Cranial nerves 2-12 grossly intact. Moves all extremities  PSYCHIATRIC: Mood and affect appropriate to situation WITH DEMENTIA, no behavioral issues  Patient Active Problem List   Diagnosis Date Noted  . Chronic kidney  disease   . COPD (chronic obstructive pulmonary disease) (HCC)   . Anemia   . Anxiety   . Barrett's esophagus   . Disease of thyroid gland   . GERD (gastroesophageal reflux disease)   . Hypertension   . Memory difficulties   . Osteoporosis   . Psoriasis (a type of skin inflammation)       Labs reviewed: Basic Metabolic Panel:    Component Value Date/Time   NA 140 02/10/2016   K 3.1 (A) 02/10/2016   CL 100 (L) 01/26/2016 1935   CO2 30 01/26/2016 1935   GLUCOSE 115 (H) 01/26/2016 1935   BUN 5 02/10/2016   CREATININE 0.7 02/10/2016   CREATININE 0.83 01/26/2016 1935   CALCIUM 9.6 01/26/2016 1935   AST 29 02/05/2016   ALT 21 02/05/2016   ALKPHOS 58 02/05/2016   GFRNONAA >60 01/26/2016 1935   GFRAA >60 01/26/2016 1935     Recent Labs  01/26/16 1935 01/26/16 1940 02/02/16 02/10/16  NA 139  --  145 140  K 2.3*  --  3.5 3.1*  CL 100*  --   --   --   CO2 30  --   --   --   GLUCOSE 115*  --   --   --   BUN 10  --  8 5  CREATININE 0.83  --  0.9 0.7  CALCIUM 9.6  --   --   --   MG  --  2.2  --   --    Liver Function Tests:  Recent Labs  02/02/16 02/05/16  AST 51* 29  ALT 30 21  ALKPHOS 54 58    Recent Labs  01/26/16 1935  LIPASE 319*   No results for input(s): AMMONIA in the last 8760 hours. CBC:  Recent Labs  01/26/16 1935 02/02/16 02/10/16  WBC 8.1 6.0 6.5  NEUTROABS 5.7  --   --   HGB 11.8* 12.4 13.0  HCT 34.6* 37 39  MCV 96.4  --   --   PLT 286 309 315   Lipid No results for input(s): CHOL, HDL, LDLCALC, TRIG in the last 8760 hours.  Cardiac Enzymes: No results for input(s): CKTOTAL, CKMB, CKMBINDEX, TROPONINI in the last 8760 hours. BNP: No results for input(s): BNP in the last 8760 hours. No results found for: MICROALBUR No  results found for: HGBA1C No results found for: TSH No results found for: VITAMINB12 No results found for: FOLATE No results found for: IRON, TIBC, FERRITIN  Imaging and Procedures obtained prior to SNF  admission: Ct Abdomen Pelvis W Contrast  Result Date: 01/26/2016 CLINICAL DATA:  Lower abdominal pain and low back pain EXAM: CT ABDOMEN AND PELVIS WITH CONTRAST TECHNIQUE: Multidetector CT imaging of the abdomen and pelvis was performed using the standard protocol following bolus administration of intravenous contrast. CONTRAST:  100mL ISOVUE-300 IOPAMIDOL (ISOVUE-300) INJECTION 61% COMPARISON:  Chest radiograph 11/01/2007 FINDINGS: Lower chest: No pulmonary nodules. No visible pleural or pericardial effusion. Hepatobiliary: Normal hepatic size and contours without focal liver lesion. No perihepatic ascites. No intra- or extrahepatic biliary dilatation. Status post cholecystectomy. Pancreas: There is inflammatory stranding surrounding the head of the pancreas. No peripancreatic fluid collection. The pancreatic duct is not dilated. Spleen: Normal. Adrenals/Urinary Tract: Normal adrenal glands. No hydronephrosis or solid renal mass. Stomach/Bowel: There is descending colonic and sigmoid diverticulosis without evidence of acute inflammation. No dilated small bowel. No abdominal fluid collection. Normal appendix. Vascular/Lymphatic: There is atherosclerotic calcification of the non aneurysmal abdominal aorta. No abdominal or pelvic adenopathy. Reproductive: Normal uterus and ovaries for age. Musculoskeletal: There is compression deformity of the T8 vertebral body, favored to be chronic. This appears unchanged relative to the radiograph of 11/01/2007 Normal visualized extrathoracic and extraperitoneal soft tissues. Other: No contributory non-categorized findings. IMPRESSION: 1. Acute pancreatitis without evidence of pancreatic necrosis or peripancreatic fluid collection. 2. Aortic atherosclerosis. 3. Descending colonic and sigmoid diverticulosis without acute diverticulitis. Electronically Signed   By: Deatra RobinsonKevin  Herman M.D.   On: 01/26/2016 21:57     Not all labs, radiology exams or other studies done during  hospitalization come through on my EPIC note; however they are reviewed by me.    Assessment and Plan  ELEVATED AMYLASE AND LIPASE - mild elevation; CT not c/w pancreatitis  ACUTE ENCEPHALOPATHY/ANXIETY- with hallucunations and confusion; neuro felt it was dementia, family felt it was zoloft; effexor 37.5 3 tabs a day was decreased to 1 tab daily and klonopin 2 mg qhs was stopped SNF - I do not agree with such a large change in effexor so quickly, have increased it back to 2 tabs; also do not agree with abrupt cessation of klonopin 2 mg; will start it back at 1.5 mg qHS and 0.5 mg q am for aggitation  noted by nursing  COPD SNF - have changed pt's proair to BID scheduled from prn as this is how she takes it at ; conmt theophylline 300 mg q 12  HALLUCUNATIONS/PSYCHOSIS SNF - plan to cont depakote 250 mg BID and zyprexa 5 mg  daily  DEMENTIA WITH BEHAVOIRS snf - CONT ARICEPT 10 MG AND DEPAKOTE AND ZYPREXA  Hypothyroidism SNF - not stated as uncontrolled;cont synthroid 112 mcg daily  GERD/ barrets esophagitis SNF - not stated as uncontrolled;cont protonoix 40 gm daily and zantac 75 mg nightly  OAB SNF - cont ditropan 5 mg qHS     Time spent > 45 min;> 50% of time with patient was spent reviewing records, labs, tests and studies, counseling and developing plan of care  Thurston Holenne D. Rachel HollingsheadAlexander, MD

## 2016-02-15 LAB — BASIC METABOLIC PANEL
BUN: 16 mg/dL (ref 4–21)
Creatinine: 0.7 mg/dL (ref 0.5–1.1)
Glucose: 106 mg/dL
POTASSIUM: 3.6 mmol/L (ref 3.4–5.3)
Sodium: 142 mmol/L (ref 137–147)

## 2016-02-15 LAB — CBC AND DIFFERENTIAL
HEMATOCRIT: 38 % (ref 36–46)
HEMOGLOBIN: 12.4 g/dL (ref 12.0–16.0)
PLATELETS: 204 10*3/uL (ref 150–399)
WBC: 5 10*3/mL

## 2016-02-15 LAB — HEPATIC FUNCTION PANEL
ALT: 20 U/L (ref 7–35)
AST: 30 U/L (ref 13–35)
Alkaline Phosphatase: 60 U/L (ref 25–125)
Bilirubin, Total: 0.3 mg/dL

## 2016-02-22 LAB — CBC AND DIFFERENTIAL
HEMATOCRIT: 37 % (ref 36–46)
HEMOGLOBIN: 12 g/dL (ref 12.0–16.0)
Platelets: 191 10*3/uL (ref 150–399)
WBC: 7.1 10^3/mL

## 2016-02-22 LAB — BASIC METABOLIC PANEL
BUN: 6 mg/dL (ref 4–21)
Creatinine: 0.7 mg/dL (ref 0.5–1.1)
GLUCOSE: 90 mg/dL
Potassium: 3.5 mmol/L (ref 3.4–5.3)
SODIUM: 147 mmol/L (ref 137–147)

## 2016-02-26 ENCOUNTER — Non-Acute Institutional Stay (SKILLED_NURSING_FACILITY): Payer: Medicare Other | Admitting: Internal Medicine

## 2016-02-26 ENCOUNTER — Encounter: Payer: Self-pay | Admitting: Internal Medicine

## 2016-02-26 DIAGNOSIS — L02211 Cutaneous abscess of abdominal wall: Secondary | ICD-10-CM

## 2016-02-26 NOTE — Progress Notes (Signed)
Location:  [facility] Room Number: 425D Place of Service:  SNF (31)  Randon Goldsmith. [name], MD  Patient Care Team: Shellia Cleverly, [place] as PCP - General [facility])  Extended Emergency Contact Information Primary Emergency Contact: [name] [phone] [name] of [place] Home Phone: [phone] Relation: Niece Secondary Emergency Contact: Dorice Lamas [place] Home Phone: [phone] Relation: Niece    Allergies: Azithromycin; Cephalexin; Cephalosporins; Ciprofloxacin; Darvon [propoxyphene]; Flagyl [metronidazole]; Librax [chlordiazepoxide-clidinium]; Macrodantin [nitrofurantoin macrocrystal]; Nitrofuran derivatives; Other; Oxycodone-acetaminophen; Percocet [oxycodone-acetaminophen]; Quetiapine; Sulfa antibiotics; and Sulfamethoxazole  Chief Complaint  Patient presents with  . Acute Visit    Acute    HPI: Patient is 76 y.o. female who kasie, the wound care nurse asked me to see for a pustule on her L abdomen that developed over the weekend. Per pt's family pt has had these before. Pt has had no fever or other systemic symptom.  Past Medical History:  Diagnosis Date  . Anemia   . Anxiety   . Arthritis   . Asthma   . Barrett's esophagus   . Chronic kidney disease   . COPD (chronic obstructive pulmonary disease) (HCC)   . Dementia   . Depression   . Disease of thyroid gland   . Diverticulitis   . GERD (gastroesophageal reflux disease)   . Hyperlipemia   . Hypertension   . Memory difficulties   . Osteoporosis   . Pancreatitis   . Psoriasis (a type of skin inflammation)   . Vertigo     Past Surgical History:  Procedure Laterality Date  . BACK SURGERY    . brain shun    . CHOLECYSTECTOMY    . ROTATOR CUFF REPAIR    . SHOULDER SURGERY    . UPPER GI ENDOSCOPY  12/19/2015   [facility], Include esophagus, stomach & duodenum or / Jejunum: Dx w/wo biopsys and dilatation; Surgeon : Leonia Reeves  Rhoton MD    Allergies as of 02/26/2016      Reactions   Azithromycin Other (See Comments)   unknown   Cephalexin Other (See Comments)   unknown   Cephalosporins    Ciprofloxacin Other (See Comments)   unknown   Darvon [propoxyphene]    Flagyl [metronidazole] Other (See Comments)   unknown   Librax [chlordiazepoxide-clidinium] Other (See Comments)   unknown   Macrodantin [nitrofurantoin Macrocrystal]    Nitrofuran Derivatives Other (See Comments)   unknown   Other Other (See Comments)   unknown   Oxycodone-acetaminophen Other (See Comments)   unknown   Percocet [oxycodone-acetaminophen]    Quetiapine Other (See Comments)   unknown Potassium level dropped.   Sulfa Antibiotics    Sulfamethoxazole Other (See Comments)   unknown      Medication List       Accurate as of 02/26/16  2:53 PM. Always use your most recent med list.          acetaminophen 325 MG tablet Commonly known as:  TYLENOL Take 650 mg by mouth every 8 (eight) hours as needed.   albuterol 108 (90 Base) MCG/ACT inhaler Commonly known as:  PROVENTIL HFA;VENTOLIN HFA Inhale into the lungs every 6 (six) hours as needed for wheezing or shortness of breath.   CEREFOLIN NAC 6-2-600 MG Tabs Take 1 tablet by mouth daily.   clonazePAM 0.5 MG tablet Commonly known as:  KLONOPIN Take 0.5 mg by mouth every morning.   clonazePAM  1 MG tablet Commonly known as:  KLONOPIN Take 1 mg by mouth at bedtime.   D3-1000 1000 units tablet Generic drug:  Cholecalciferol Take 1,000 Units by mouth daily.   divalproex 250 MG DR tablet Commonly known as:  DEPAKOTE Take 250 mg by mouth 2 (two) times daily.   donepezil 10 MG tablet Commonly known as:  ARICEPT Take 10 mg by mouth at bedtime.   ENSURE ENLIVE PO Take 237 mLs by mouth 2 (two) times daily between meals.   levothyroxine 112 MCG tablet Commonly known as:  SYNTHROID, LEVOTHROID Take 112 mcg by mouth daily before breakfast. 6 am   OLANZapine 5 MG  tablet Commonly known as:  ZYPREXA Take 5 mg by mouth at bedtime.   oxybutynin 5 MG tablet Commonly known as:  DITROPAN Take 5 mg by mouth every evening.   pantoprazole 40 MG tablet Commonly known as:  PROTONIX Take 40 mg by mouth 2 (two) times daily.   ranitidine 75 MG tablet Commonly known as:  ZANTAC Take 75 mg by mouth at bedtime.   theophylline 300 MG 12 hr tablet Commonly known as:  THEODUR Take 300 mg by mouth every 12 (twelve) hours.   venlafaxine 37.5 MG tablet Commonly known as:  EFFEXOR Take 75 mg by mouth daily. 2 capsules       No orders of the defined types were placed in this encounter.   Immunization History  Administered Date(s) Administered  . Influenza, High Dose Seasonal PF 12/20/2014  . Influenza, Seasonal, Injecte, Preservative Fre 10/10/2014  . Influenza-Unspecified 12/07/2015  . PPD Test 02/10/2016  . Pneumococcal Conjugate-13 12/20/2014  . Td 08/26/2014    Social History  Substance Use Topics  . Smoking status: Former Smoker    Quit date: 12/17/1990  . Smokeless tobacco: Never Used  . Alcohol use No    Review of Systems  DATA OBTAINED: from patient, nurse, family member GENERAL:  no fevers, fatigue, appetite changes SKIN:pustule with mild pain HEENT: No complaint RESPIRATORY: No cough, wheezing, SOB CARDIAC: No chest pain, palpitations, lower extremity edema  GI: No abdominal pain, No N/V/D or constipation, No heartburn or reflux  GU: No dysuria, frequency or urgency, or incontinence  MUSCULOSKELETAL: No unrelieved bone/joint pain NEUROLOGIC: No headache, dizziness  PSYCHIATRIC: No overt anxiety or sadness  Vitals:   02/26/16 1450  BP: 114/78  Pulse: 82  Resp: 20  Temp: (!) 96.8 F (36 C)   Body mass index is 27.8 kg/m. Physical Exam  GENERAL APPEARANCE: Alert, conversant, No acute distress  SKIN: 1 cm very thin walled pustule thsat easilt drained with pressure/friction to reveal a small tender bump that is draining  some pus HEENT: Unremarkable RESPIRATORY: Breathing is even, unlabored. Lung sounds are clear   CARDIOVASCULAR: Heart RRR no murmurs, rubs or gallops. No peripheral edema  GASTROINTESTINAL: Abdomen is soft, non-tender, not distended w/ normal bowel sounds.  GENITOURINARY: Bladder non tender, not distended  MUSCULOSKELETAL: No abnormal joints or musculature NEUROLOGIC: Cranial nerves 2-12 grossly intact. Moves all extremities PSYCHIATRIC: Mood and affect appropriate to situation, no behavioral issues  Patient Active Problem List   Diagnosis Date Noted  . Elevated amylase and lipase 02/13/2016  . Acute encephalopathy 02/13/2016  . Psychosis 02/13/2016  . Dementia with behavioral disturbance 02/13/2016  . Overactive bladder 02/13/2016  . Chronic kidney disease   . COPD (chronic obstructive pulmonary disease) (HCC)   . Anemia   . Anxiety   . Barrett's esophagus   . Hypothyroidism   .  GERD (gastroesophageal reflux disease)   . Hypertension   . Memory difficulties   . Osteoporosis   . Psoriasis (a type of skin inflammation)     CMP     Component Value Date/Time   NA 140 02/10/2016   K 3.1 (A) 02/10/2016   CL 100 (L) 01/26/2016 1935   CO2 30 01/26/2016 1935   GLUCOSE 115 (H) 01/26/2016 1935   BUN 5 02/10/2016   CREATININE 0.7 02/10/2016   CREATININE 0.83 01/26/2016 1935   CALCIUM 9.6 01/26/2016 1935   AST 29 02/05/2016   ALT 21 02/05/2016   ALKPHOS 58 02/05/2016   GFRNONAA >60 01/26/2016 1935   GFRAA >60 01/26/2016 1935    Recent Labs  01/26/16 1935 01/26/16 1940 02/02/16 02/10/16  NA 139  --  145 140  K 2.3*  --  3.5 3.1*  CL 100*  --   --   --   CO2 30  --   --   --   GLUCOSE 115*  --   --   --   BUN 10  --  8 5  CREATININE 0.83  --  0.9 0.7  CALCIUM 9.6  --   --   --   MG  --  2.2  --   --     Recent Labs  02/02/16 02/05/16  AST 51* 29  ALT 30 21  ALKPHOS 54 58    Recent Labs  01/26/16 1935 02/02/16 02/10/16  WBC 8.1 6.0 6.5  NEUTROABS 5.7  --    --   HGB 11.8* 12.4 13.0  HCT 34.6* 37 39  MCV 96.4  --   --   PLT 286 309 315   No results for input(s): CHOL, LDLCALC, TRIG in the last 8760 hours.  Invalid input(s): HCL No results found for: MICROALBUR No results found for: TSH No results found for: HGBA1C No results found for: CHOL, HDL, LDLCALC, LDLDIRECT, TRIG, CHOLHDL  Significant Diagnostic Results in last 30 days:  No results found.  Assessment and Plan  PUSTULE L ABDOMINAL WALL / skin abscess- easily broke down abd drained to reveal real problem of small underlying abscess that is not quite ripe but is draining some pus; will apply warm compressess TID and start Doxycycline 100 mg BID for 7 days ;its not ready to I and D and hopefully will resolve without it but will monitor;pt and her family are fine with that    Time spent > 25 min German Manke D. Lyn HollingsheadAlexander, MD

## 2016-03-07 ENCOUNTER — Other Ambulatory Visit: Payer: Self-pay

## 2016-03-07 MED ORDER — CLONAZEPAM 1 MG PO TABS: ORAL_TABLET | ORAL | 5 refills | Status: DC

## 2016-03-07 NOTE — Telephone Encounter (Signed)
Faxed to [facility] Fax Number: [phone], Phone Number [phone]  

## 2016-03-14 ENCOUNTER — Non-Acute Institutional Stay (SKILLED_NURSING_FACILITY): Payer: Medicare Other | Admitting: Internal Medicine

## 2016-03-14 DIAGNOSIS — Z20828 Contact with and (suspected) exposure to other viral communicable diseases: Secondary | ICD-10-CM

## 2016-03-20 ENCOUNTER — Non-Acute Institutional Stay (SKILLED_NURSING_FACILITY): Payer: Medicare Other | Admitting: Internal Medicine

## 2016-03-20 ENCOUNTER — Encounter: Payer: Self-pay | Admitting: Internal Medicine

## 2016-03-20 DIAGNOSIS — M1712 Unilateral primary osteoarthritis, left knee: Secondary | ICD-10-CM | POA: Diagnosis not present

## 2016-03-20 NOTE — Progress Notes (Signed)
Location:  [facility] Room Number: [phone] Place of Service:  SNF [phone])  Randon Goldsmith. [name], MD  Patient Care Team: Shellia Cleverly, [place] as PCP - [facility])  Extended Emergency Contact Information Primary Emergency Contact: [name] [phone] [name] of [place] Home Phone: [phone] Relation: Niece Secondary Emergency Contact: Dorice Lam[place] Home Phone: [phone] Relation: Niece    Allergies: Azithromycin; Cephalexin; Cephalosporins; Ciprofloxacin; Darvon [propoxyphene]; Flagyl [metronidazole]; Librax [chlordiazepoxide-clidinium]; Macrodantin [nitrofurantoin macrocrystal]; Nitrofuran derivatives; Other; Oxycodone-acetaminophen; Percocet [oxycodone-acetaminophen]; Quetiapine; Sulfa antibiotics; and Sulfamethoxazole  Chief Complaint  Patient presents with  . Acute Visit    Acute    HPI: Patient is 76 y.o. female who nursing asked me to see because RP asked about pt's c/o l knee pain. Cannot get good hx 2/2 to dementia but it seems this is a chronic problem.  Past Medical History:  Diagnosis Date  . Anemia   . Anxiety   . Arthritis   . Asthma   . Barrett's esophagus   . Chronic kidney disease   . COPD (chronic obstructive pulmonary disease) (HCC)   . Dementia   . Depression   . Disease of thyroid gland   . Diverticulitis   . GERD (gastroesophageal reflux disease)   . Hyperlipemia   . Hypertension   . Memory difficulties   . Osteoporosis   . Pancreatitis   . Psoriasis (a type of skin inflammation)   . Vertigo     Past Surgical History:  Procedure Laterality Date  . BACK SURGERY    . brain shun    . CHOLECYSTECTOMY    . ROTATOR CUFF REPAIR    . SHOULDER SURGERY    . UPPER GI ENDOSCOPY  12/19/2015   [facility] End[facility], Include esophagus, stomach & duodenum or / Jejunum: Dx w/wo biopsys and dilatation; Surgeon : Leonia Reeves Rhoton MD    Allergies as of 03/20/2016      Reactions    Azithromycin Other (See Comments)   unknown   Cephalexin Other (See Comments)   unknown   Cephalosporins    Ciprofloxacin Other (See Comments)   unknown   Darvon [propoxyphene]    Flagyl [metronidazole] Other (See Comments)   unknown   Librax [chlordiazepoxide-clidinium] Other (See Comments)   unknown   Macrodantin [nitrofurantoin Macrocrystal]    Nitrofuran Derivatives Other (See Comments)   unknown   Other Other (See Comments)   unknown   Oxycodone-acetaminophen Other (See Comments)   unknown   Percocet [oxycodone-acetaminophen]    Quetiapine Other (See Comments)   unknown Potassium level dropped.   Sulfa Antibiotics    Sulfamethoxazole Other (See Comments)   unknown      Medication List       Accurate as of 03/20/16 12:25 PM. Always use your most recent med list.          acetaminophen 325 MG tablet Commonly known as:  TYLENOL Take 650 mg by mouth every 8 (eight) hours as needed.   albuterol 108 (90 Base) MCG/ACT inhaler Commonly known as:  PROVENTIL HFA;VENTOLIN HFA Inhale 2 puffs into the lungs every 6 (six) hours as needed for wheezing.   CEREFOLIN PO Take 1 capsule by mouth daily.   clonazePAM 0.5 MG tablet Commonly known as:  KLONOPIN Take 0.5 mg by mouth every morning. Also give 1 tablet by mouth daily at 4 pm hold for somnolence   clonazePAM 1 MG  tablet Commonly known as:  KLONOPIN Take 2 mg by mouth at bedtime.   D3-1000 1000 units tablet Generic drug:  Cholecalciferol Take 1,000 Units by mouth daily.   divalproex 250 MG DR tablet Commonly known as:  DEPAKOTE Take 250 mg by mouth 2 (two) times daily.   donepezil 10 MG tablet Commonly known as:  ARICEPT Take 10 mg by mouth at bedtime.   ENSURE ENLIVE PO Take 237 mLs by mouth 2 (two) times daily between meals.   levothyroxine 112 MCG tablet Commonly known as:  SYNTHROID, LEVOTHROID Take 112 mcg by mouth daily before breakfast. 6 am   OLANZapine 5 MG tablet Commonly known as:   ZYPREXA Take 5 mg by mouth at bedtime.   oseltamivir 75 MG capsule Commonly known as:  TAMIFLU Take 75 mg by mouth daily.   oxybutynin 5 MG tablet Commonly known as:  DITROPAN Take 5 mg by mouth every evening.   pantoprazole 40 MG tablet Commonly known as:  PROTONIX Take 40 mg by mouth 2 (two) times daily.   ranitidine 75 MG tablet Commonly known as:  ZANTAC Take 75 mg by mouth at bedtime.   theophylline 300 MG 12 hr tablet Commonly known as:  THEODUR Take 300 mg by mouth every 12 (twelve) hours.   venlafaxine 37.5 MG tablet Commonly known as:  EFFEXOR Take 75 mg by mouth daily. 2 capsules       Meds ordered this encounter  Medications  . oseltamivir (TAMIFLU) 75 MG capsule    Sig: Take 75 mg by mouth daily.     Immunization History  Administered Date(s) Administered  . Influenza, High Dose Seasonal PF 12/20/2014  . Influenza, Seasonal, Injecte, Preservative Fre 10/10/2014  . Influenza-Unspecified 12/07/2015  . PPD Test 02/10/2016, 02/26/2016  . Pneumococcal Conjugate-13 12/20/2014  . Td 08/26/2014    Social History  Substance Use Topics  . Smoking status: Former Smoker    Quit date: 12/17/1990  . Smokeless tobacco: Never Used  . Alcohol use No    Review of Systems  DATA OBTAINED: from patient, nurse GENERAL:  no fevers, fatigue, appetite changes SKIN: No itching, rash HEENT: No complaint RESPIRATORY: No cough, wheezing, SOB CARDIAC: No chest pain, palpitations, lower extremity edema  GI: No abdominal pain, No N/V/D or constipation, No heartburn or reflux  GU: No dysuria, frequency or urgency, or incontinence  MUSCULOSKELETAL: pain l knee as per HPIU NEUROLOGIC: No headache, dizziness  PSYCHIATRIC: No overt anxiety or sadness  Vitals:   03/20/16 1212  BP: 136/69  Pulse: 88  Resp: 20  Temp: (!) 96.5 F (35.8 C)   Body mass index is 29.89 kg/m. Physical Exam  GENERAL APPEARANCE: Alert,  No acute distress  SKIN: No diaphoresis rash HEENT:  Unremarkable RESPIRATORY: Breathing is even, unlabored. Lung sounds are clear   CARDIOVASCULAR: Heart RRR no murmurs, rubs or gallops. No peripheral edema  GASTROINTESTINAL: Abdomen is soft, non-tender, not distended w/ normal bowel sounds.  GENITOURINARY: Bladder non tender, not distended  MUSCULOSKELETAL: arthritis in L knee, no sweliing, no joint line tender, no redness or effusion NEUROLOGIC: Cranial nerves 2-12 grossly intact. Moves all extremities PSYCHIATRIC: Mood and affect appropriate to situation, no behavioral issues  Patient Active Problem List   Diagnosis Date Noted  . Elevated amylase and lipase 02/13/2016  . Acute encephalopathy 02/13/2016  . Psychosis 02/13/2016  . Dementia with behavioral disturbance 02/13/2016  . Overactive bladder 02/13/2016  . Chronic kidney disease   . COPD (chronic obstructive pulmonary disease) (  HCC)   . Anemia   . Anxiety   . Barrett's esophagus   . Hypothyroidism   . GERD (gastroesophageal reflux disease)   . Hypertension   . Memory difficulties   . Osteoporosis   . Psoriasis (a type of skin inflammation)     CMP     Component Value Date/Time   NA 147 02/22/2016   K 3.5 02/22/2016   CL 100 (L) 01/26/2016 1935   CO2 30 01/26/2016 1935   GLUCOSE 115 (H) 01/26/2016 1935   BUN 6 02/22/2016   CREATININE 0.7 02/22/2016   CREATININE 0.83 01/26/2016 1935   CALCIUM 9.6 01/26/2016 1935   AST 30 02/15/2016   ALT 20 02/15/2016   ALKPHOS 60 02/15/2016   GFRNONAA >60 01/26/2016 1935   GFRAA >60 01/26/2016 1935    Recent Labs  01/26/16 1935 01/26/16 1940  02/10/16 02/15/16 02/22/16  NA 139  --   < > 140 142 147  K 2.3*  --   < > 3.1* 3.6 3.5  CL 100*  --   --   --   --   --   CO2 30  --   --   --   --   --   GLUCOSE 115*  --   --   --   --   --   BUN 10  --   < > 5 16 6   CREATININE 0.83  --   < > 0.7 0.7 0.7  CALCIUM 9.6  --   --   --   --   --   MG  --  2.2  --   --   --   --   < > = values in this interval not  displayed.  Recent Labs  02/02/16 02/05/16 02/15/16  AST 51* 29 30  ALT 30 21 20   ALKPHOS 54 58 60    Recent Labs  01/26/16 1935  02/10/16 02/15/16 02/22/16  WBC 8.1  < > 6.5 5.0 7.1  NEUTROABS 5.7  --   --   --   --   HGB 11.8*  < > 13.0 12.4 12.0  HCT 34.6*  < > 39 38 37  MCV 96.4  --   --   --   --   PLT 286  < > 315 204 191  < > = values in this interval not displayed. No results for input(s): CHOL, LDLCALC, TRIG in the last 8760 hours.  Invalid input(s): HCL No results found for: MICROALBUR No results found for: TSH No results found for: HGBA1C No results found for: CHOL, HDL, LDLCALC, LDLDIRECT, TRIG, CHOLHDL  Significant Diagnostic Results in last 30 days:  No results found.  Assessment and Plan  OA L knee - pt has prn tylenol which she is probably not asking for so will schedule tylenol 650MG  BID     Randon GoldsmithAnne D. Lyn HollingsheadAlexander, MD

## 2016-03-28 ENCOUNTER — Other Ambulatory Visit: Payer: Self-pay | Admitting: *Deleted

## 2016-03-28 MED ORDER — CLONAZEPAM 1 MG PO TABS: ORAL_TABLET | ORAL | 0 refills | Status: DC

## 2016-03-28 NOTE — Telephone Encounter (Signed)
[facility] #[phone] Fax: [phone]  

## 2016-04-07 ENCOUNTER — Encounter: Payer: Self-pay | Admitting: Internal Medicine

## 2016-04-15 ENCOUNTER — Non-Acute Institutional Stay (SKILLED_NURSING_FACILITY): Payer: Medicare Other | Admitting: Internal Medicine

## 2016-04-15 DIAGNOSIS — B3789 Other sites of candidiasis: Secondary | ICD-10-CM | POA: Diagnosis not present

## 2016-04-22 ENCOUNTER — Encounter: Payer: Self-pay | Admitting: Internal Medicine

## 2016-04-22 NOTE — Progress Notes (Signed)
Location:  [facility] Room Number: [phone] Place of Service:  SNF [phone]  Rachel Goldsmithnne D. Lyn HollingsheadAlexander, MD  Patient Care Team: Rachel [place], [place] as PCP - [facility](General Practice)  Extended Emergency Contact Information Primary Emergency Contact: Rachel Dickerson,Rachel Dickerson          [name] [phone] [place] Home Phone: [phone] Relation: Niece Secondary Emergency Contact: Rachel Dickerson,Rachel Dickerson  Rachel Dickerson Home Phone: [phone] Relation: Niece    Allergies: Azithromycin; Cephalexin; Cephalosporins; Ciprofloxacin; Darvon [propoxyphene]; Flagyl [metronidazole]; Librax [chlordiazepoxide-clidinium]; Macrodantin [nitrofurantoin macrocrystal]; Nitrofuran derivatives; Other; Oxycodone-acetaminophen; Percocet [oxycodone-acetaminophen]; Quetiapine; Sulfa antibiotics; and Sulfamethoxazole  Chief Complaint  Patient presents with  . Acute Visit    Acute    HPI: Patient is 76 y.o. female who charge nurse asked me to see because she has a rash in the intertrigenous folds of her R groin. Admits it is itchy.Noted today.  Past Medical History:  Diagnosis Date  . Anemia   . Anxiety   . Arthritis   . Asthma   . Barrett's esophagus   . Chronic kidney disease   . COPD (chronic obstructive pulmonary disease) (HCC)   . Dementia   . Depression   . Disease of thyroid gland   . Diverticulitis   . GERD (gastroesophageal reflux disease)   . Hyperlipemia   . Hypertension   . Memory difficulties   . Osteoporosis   . Pancreatitis   . Psoriasis (a type of skin inflammation)   . Vertigo     Past Surgical History:  Procedure Laterality Date  . BACK SURGERY    . brain shun    . CHOLECYSTECTOMY    . ROTATOR CUFF REPAIR    . SHOULDER SURGERY    . UPPER GI ENDOSCOPY  12/19/2015   [facility], Include esophagus, stomach & duodenum or / Jejunum: Dx w/wo biopsys and dilatation; Surgeon : Rachel ReevesAlbert John Rhoton MD    Allergies as of 04/15/2016      Reactions   Azithromycin  Other (See Comments)   unknown   Cephalexin Other (See Comments)   unknown   Cephalosporins    Ciprofloxacin Other (See Comments)   unknown   Darvon [propoxyphene]    Flagyl [metronidazole] Other (See Comments)   unknown   Librax [chlordiazepoxide-clidinium] Other (See Comments)   unknown   Macrodantin [nitrofurantoin Macrocrystal]    Nitrofuran Derivatives Other (See Comments)   unknown   Other Other (See Comments)   unknown   Oxycodone-acetaminophen Other (See Comments)   unknown   Percocet [oxycodone-acetaminophen]    Quetiapine Other (See Comments)   unknown Potassium level dropped.   Sulfa Antibiotics    Sulfamethoxazole Other (See Comments)   unknown      Medication List       Accurate as of 04/15/16 11:59 PM. Always use your most recent med list.          acetaminophen 325 MG tablet Commonly known as:  TYLENOL Take 650 mg by mouth every morning. Also give 2 tables by mouth (650 mg) every afternoon   acetaminophen 325 MG tablet Commonly known as:  TYLENOL Take 650 mg by mouth every 8 (eight) hours as needed.   albuterol 108 (90 Base) MCG/ACT inhaler Commonly known as:  PROVENTIL HFA;VENTOLIN HFA Inhale 2 puffs into the lungs every 6 (six) hours as needed for wheezing.   CEREFOLIN PO Take 1 capsule by mouth daily.   clonazePAM 0.5 MG tablet Commonly known as:  KLONOPIN Take 0.5  mg by mouth every morning. Also give 1 tablet by mouth daily at 4 pm hold for somnolence. Give 2 tablets (1 mg) by mouth at bedtime for anxiety   D3-1000 1000 units tablet Generic drug:  Cholecalciferol Take 1,000 Units by mouth daily.   divalproex 250 MG DR tablet Commonly known as:  DEPAKOTE Take 250 mg by mouth 2 (two) times daily.   donepezil 10 MG tablet Commonly known as:  ARICEPT Take 10 mg by mouth at bedtime.   ENSURE ENLIVE PO Take 237 mLs by mouth 2 (two) times daily between meals.   levothyroxine 112 MCG tablet Commonly known as:  SYNTHROID, LEVOTHROID Take  112 mcg by mouth daily before breakfast. 6 am   OLANZapine 5 MG tablet Commonly known as:  ZYPREXA Take 5 mg by mouth at bedtime.   oxybutynin 5 MG tablet Commonly known as:  DITROPAN Take 5 mg by mouth every evening.   pantoprazole 40 MG tablet Commonly known as:  PROTONIX Take 40 mg by mouth 2 (two) times daily.   ranitidine 75 MG tablet Commonly known as:  ZANTAC Take 75 mg by mouth at bedtime.   theophylline 300 MG 12 hr tablet Commonly known as:  THEODUR Take 300 mg by mouth every 12 (twelve) hours.   venlafaxine 37.5 MG tablet Commonly known as:  EFFEXOR Take 75 mg by mouth daily. 2 capsules       No orders of the defined types were placed in this encounter.   Immunization History  Administered Date(s) Administered  . Influenza, High Dose Seasonal PF 12/20/2014  . Influenza, Seasonal, Injecte, Preservative Fre 10/10/2014  . Influenza-Unspecified 12/07/2015  . PPD Test 02/10/2016, 02/26/2016  . Pneumococcal Conjugate-13 12/20/2014  . Td 08/26/2014    Social History  Substance Use Topics  . Smoking status: Former Smoker    Quit date: 12/17/1990  . Smokeless tobacco: Never Used  . Alcohol use No    Review of Systems  DATA OBTAINED: from patient, nurse GENERAL:  no fevers, fatigue, appetite changes SKIN:  itching, rash as per HPI HEENT: No complaint RESPIRATORY: No cough, wheezing, SOB CARDIAC: No chest pain, palpitations, lower extremity edema  GI: No abdominal pain, No N/V/D or constipation, No heartburn or reflux  GU: No dysuria, frequency or urgency, or incontinence  MUSCULOSKELETAL: No unrelieved bone/joint pain NEUROLOGIC: No headache, dizziness  PSYCHIATRIC: No overt anxiety or sadness  Vitals:   04/15/16 1118  BP: 114/78  Pulse: 80  Resp: 20  Temp: (!) 96.8 F (36 C)   Body mass index is 29.04 kg/m. Physical Exam  GENERAL APPEARANCE: Alert,  No acute distress  SKIN: rash in R groin red and moist c/w yeast HEENT:  Unremarkable RESPIRATORY: Breathing is even, unlabored. Lung sounds are clear   CARDIOVASCULAR: Heart RRR no murmurs, rubs or gallops. No peripheral edema  GASTROINTESTINAL: Abdomen is soft, non-tender, not distended w/ normal bowel sounds.  GENITOURINARY: Bladder non tender, not distended  MUSCULOSKELETAL: No abnormal joints or musculature NEUROLOGIC: Cranial nerves 2-12 grossly intact. Moves all extremities PSYCHIATRIC: Mood and affect appropriate to situation with dementia, no behavioral issues  Patient Active Problem List   Diagnosis Date Noted  . Elevated amylase and lipase 02/13/2016  . Acute encephalopathy 02/13/2016  . Psychosis 02/13/2016  . Dementia with behavioral disturbance 02/13/2016  . Overactive bladder 02/13/2016  . Chronic kidney disease   . COPD (chronic obstructive pulmonary disease) (HCC)   . Anemia   . Anxiety   . Barrett's esophagus   .  Hypothyroidism   . GERD (gastroesophageal reflux disease)   . Hypertension   . Memory difficulties   . Osteoporosis   . Psoriasis (a type of skin inflammation)     CMP     Component Value Date/Time   NA 147 02/22/2016   K 3.5 02/22/2016   CL 100 (L) 01/26/2016 1935   CO2 30 01/26/2016 1935   GLUCOSE 115 (H) 01/26/2016 1935   BUN 6 02/22/2016   CREATININE 0.7 02/22/2016   CREATININE 0.83 01/26/2016 1935   CALCIUM 9.6 01/26/2016 1935   AST 30 02/15/2016   ALT 20 02/15/2016   ALKPHOS 60 02/15/2016   GFRNONAA >60 01/26/2016 1935   GFRAA >60 01/26/2016 1935    Recent Labs  01/26/16 1935 01/26/16 1940  02/10/16 02/15/16 02/22/16  NA 139  --   < > 140 142 147  K 2.3*  --   < > 3.1* 3.6 3.5  CL 100*  --   --   --   --   --   CO2 30  --   --   --   --   --   GLUCOSE 115*  --   --   --   --   --   BUN 10  --   < > 5 16 6   CREATININE 0.83  --   < > 0.7 0.7 0.7  CALCIUM 9.6  --   --   --   --   --   MG  --  2.2  --   --   --   --   < > = values in this interval not displayed.  Recent Labs  02/02/16 02/05/16  02/15/16  AST 51* 29 30  ALT 30 21 20   ALKPHOS 54 58 60    Recent Labs  01/26/16 1935  02/10/16 02/15/16 02/22/16  WBC 8.1  < > 6.5 5.0 7.1  NEUTROABS 5.7  --   --   --   --   HGB 11.8*  < > 13.0 12.4 12.0  HCT 34.6*  < > 39 38 37  MCV 96.4  --   --   --   --   PLT 286  < > 315 204 191  < > = values in this interval not displayed. No results for input(s): CHOL, LDLCALC, TRIG in the last 8760 hours.  Invalid input(s): HCL No results found for: MICROALBUR No results found for: TSH No results found for: HGBA1C No results found for: CHOL, HDL, LDLCALC, LDLDIRECT, TRIG, CHOLHDL  Significant Diagnostic Results in last 30 days:  No results found.  Assessment and Plan  CANDIDA RASH R GROIN - have written for diflucan 100 mg daily for 7 days; at the same time will be using clotrimazole powder BID   Rachel Goldsmith. [name], MD

## 2016-04-24 ENCOUNTER — Encounter: Payer: Self-pay | Admitting: Internal Medicine

## 2016-04-24 NOTE — Progress Notes (Signed)
Location:  [facility] Room Number: [phone] Place of Service:  SNF [phone])  Randon Goldsmith. [name], MD  Patient Care Team: Shellia Cleverly, [place] as PCP - General [facility])  Extended Emergency Contact Information Primary Emergency Contact: [name] [phone] [name] Home Phone: [phone] Relation: Niece Secondary Emergency Contact: Dorice Lamas [place] Home Phone: [phone] Relation: Niece    Allergies: Azithromycin; Cephalexin; Cephalosporins; Ciprofloxacin; Darvon [propoxyphene]; Flagyl [metronidazole]; Librax [chlordiazepoxide-clidinium]; Macrodantin [nitrofurantoin macrocrystal]; Nitrofuran derivatives; Other; Oxycodone-acetaminophen; Percocet [oxycodone-acetaminophen]; Quetiapine; Sulfa antibiotics; and Sulfamethoxazole  Chief Complaint  Patient presents with  . Acute Visit    HPI: Patient is 76 y.o. female who is being seen acutely because an outbreak of Influenza A per CDC guidelines was recognized on 03/13/2016. Pt has no c/o flu like symptoms;therefore pt will need to be prophylaxed with Tamiflu for a minimum of 14 days per CDC protocol.  Past Medical History:  Diagnosis Date  . Anemia   . Anxiety   . Arthritis   . Asthma   . Barrett's esophagus   . Chronic kidney disease   . COPD (chronic obstructive pulmonary disease) (HCC)   . Dementia   . Depression   . Disease of thyroid gland   . Diverticulitis   . GERD (gastroesophageal reflux disease)   . Hyperlipemia   . Hypertension   . Memory difficulties   . Osteoporosis   . Pancreatitis   . Psoriasis (a type of skin inflammation)   . Vertigo     Past Surgical History:  Procedure Laterality Date  . BACK SURGERY    . brain shun    . CHOLECYSTECTOMY    . ROTATOR CUFF REPAIR    . SHOULDER SURGERY    . UPPER GI ENDOSCOPY  12/19/2015   [facility] End[facility], Include esophagus, stomach & duodenum or / Jejunum: Dx w/wo biopsys and  dilatation; Surgeon : Leonia Reeves Rhoton MD    Allergies as of 03/14/2016      Reactions   Azithromycin Other (See Comments)   unknown   Cephalexin Other (See Comments)   unknown   Cephalosporins    Ciprofloxacin Other (See Comments)   unknown   Darvon [propoxyphene]    Flagyl [metronidazole] Other (See Comments)   unknown   Librax [chlordiazepoxide-clidinium] Other (See Comments)   unknown   Macrodantin [nitrofurantoin Macrocrystal]    Nitrofuran Derivatives Other (See Comments)   unknown   Other Other (See Comments)   unknown   Oxycodone-acetaminophen Other (See Comments)   unknown   Percocet [oxycodone-acetaminophen]    Quetiapine Other (See Comments)   unknown Potassium level dropped.   Sulfa Antibiotics    Sulfamethoxazole Other (See Comments)   unknown      Medication List       Accurate as of 03/14/16 11:59 PM. Always use your most recent med list.          acetaminophen 325 MG tablet Commonly known as:  TYLENOL Take 650 mg by mouth every morning. Also give 2 tables by mouth (650 mg) every afternoon   acetaminophen 325 MG tablet Commonly known as:  TYLENOL Take 650 mg by mouth every 8 (eight) hours as needed.   albuterol 108 (90 Base) MCG/ACT inhaler Commonly known as:  PROVENTIL HFA;VENTOLIN HFA Inhale 2 puffs into the lungs every 6 (six) hours as needed for wheezing.   CEREFOLIN PO Take 1 capsule by mouth daily.  clonazePAM 0.5 MG tablet Commonly known as:  KLONOPIN Take 0.5 mg by mouth every morning. Also give 1 tablet by mouth daily at 4 pm hold for somnolence. Give 2 tablets (1 mg) by mouth at bedtime for anxiety   D3-1000 1000 units tablet Generic drug:  Cholecalciferol Take 1,000 Units by mouth daily.   divalproex 250 MG DR tablet Commonly known as:  DEPAKOTE Take 250 mg by mouth 2 (two) times daily.   donepezil 10 MG tablet Commonly known as:  ARICEPT Take 10 mg by mouth at bedtime.   ENSURE ENLIVE PO Take 237 mLs by mouth 2 (two)  times daily between meals.   levothyroxine 112 MCG tablet Commonly known as:  SYNTHROID, LEVOTHROID Take 112 mcg by mouth daily before breakfast. 6 am   OLANZapine 5 MG tablet Commonly known as:  ZYPREXA Take 5 mg by mouth at bedtime.   oxybutynin 5 MG tablet Commonly known as:  DITROPAN Take 5 mg by mouth every evening.   pantoprazole 40 MG tablet Commonly known as:  PROTONIX Take 40 mg by mouth 2 (two) times daily.   ranitidine 75 MG tablet Commonly known as:  ZANTAC Take 75 mg by mouth at bedtime.   theophylline 300 MG 12 hr tablet Commonly known as:  THEODUR Take 300 mg by mouth every 12 (twelve) hours.   venlafaxine 37.5 MG tablet Commonly known as:  EFFEXOR Take 75 mg by mouth daily. 2 capsules       No orders of the defined types were placed in this encounter.   Immunization History  Administered Date(s) Administered  . Influenza, High Dose Seasonal PF 12/20/2014  . Influenza, Seasonal, Injecte, Preservative Fre 10/10/2014  . Influenza-Unspecified 12/07/2015  . PPD Test 02/10/2016, 02/26/2016  . Pneumococcal Conjugate-13 12/20/2014  . Td 08/26/2014    Social History  Substance Use Topics  . Smoking status: Former Smoker    Quit date: 12/17/1990  . Smokeless tobacco: Never Used  . Alcohol use No    Review of Systems  DATA OBTAINED: from patient, nurse GENERAL:  no fevers SKIN: No itching, rash HEENT: no rhinorrhea, congestion, ST or ear pain RESPIRATORY: No cough, wheezing, SOB CARDIAC: No chest pain, palpitations, lower extremity edema  GI: No abdominal pain, No N/V/D or constipation, No heartburn or reflux  MUSCULOSKELETAL: No muscle aches NEUROLOGIC: No headache, dizziness   Vitals:   03/14/16 1410  BP: 114/78  Pulse: 80  Resp: 20  Temp: (!) 96.8 F (36 C)   Body mass index is 29.04 kg/m. Physical Exam  GENERAL APPEARANCE: Alert, conversant, No acute distress  SKIN: No diaphoresis rash HEENT: Unremarkable RESPIRATORY: Breathing  is even, unlabored. Lung sounds are clear   CARDIOVASCULAR: Heart RRR no murmurs, rubs or gallops. No peripheral edema  GASTROINTESTINAL: Abdomen is soft, non-tender, not distended w/ normal bowel sounds.   NEUROLOGIC: Cranial nerves 2-12 grossly intact PSYCHIATRIC: baseline, no mental status changes  Patient Active Problem List   Diagnosis Date Noted  . Elevated amylase and lipase 02/13/2016  . Acute encephalopathy 02/13/2016  . Psychosis 02/13/2016  . Dementia with behavioral disturbance 02/13/2016  . Overactive bladder 02/13/2016  . Chronic kidney disease   . COPD (chronic obstructive pulmonary disease) (HCC)   . Anemia   . Anxiety   . Barrett's esophagus   . Hypothyroidism   . GERD (gastroesophageal reflux disease)   . Hypertension   . Memory difficulties   . Osteoporosis   . Psoriasis (a type of skin inflammation)  CMP     Component Value Date/Time   NA 147 02/22/2016   K 3.5 02/22/2016   CL 100 (L) 01/26/2016 1935   CO2 30 01/26/2016 1935   GLUCOSE 115 (H) 01/26/2016 1935   BUN 6 02/22/2016   CREATININE 0.7 02/22/2016   CREATININE 0.83 01/26/2016 1935   CALCIUM 9.6 01/26/2016 1935   AST 30 02/15/2016   ALT 20 02/15/2016   ALKPHOS 60 02/15/2016   GFRNONAA >60 01/26/2016 1935   GFRAA >60 01/26/2016 1935    Recent Labs  01/26/16 1935 01/26/16 1940  02/10/16 02/15/16 02/22/16  NA 139  --   < > 140 142 147  K 2.3*  --   < > 3.1* 3.6 3.5  CL 100*  --   --   --   --   --   CO2 30  --   --   --   --   --   GLUCOSE 115*  --   --   --   --   --   BUN 10  --   < > 5 16 6   CREATININE 0.83  --   < > 0.7 0.7 0.7  CALCIUM 9.6  --   --   --   --   --   MG  --  2.2  --   --   --   --   < > = values in this interval not displayed.  Recent Labs  02/02/16 02/05/16 02/15/16  AST 51* 29 30  ALT 30 21 20   ALKPHOS 54 58 60    Recent Labs  01/26/16 1935  02/10/16 02/15/16 02/22/16  WBC 8.1  < > 6.5 5.0 7.1  NEUTROABS 5.7  --   --   --   --   HGB 11.8*  < > 13.0  12.4 12.0  HCT 34.6*  < > 39 38 37  MCV 96.4  --   --   --   --   PLT 286  < > 315 204 191  < > = values in this interval not displayed. No results for input(s): CHOL, LDLCALC, TRIG in the last 8760 hours.  Invalid input(s): HCL No results found for: MICROALBUR No results found for: TSH No results found for: HGBA1C No results found for: CHOL, HDL, LDLCALC, LDLDIRECT, TRIG, CHOLHDL  Significant Diagnostic Results in last 30 days:  No results found.  Assessment and Plan  EXPOSURE TO FLU/ INFLUENZA OUTBREAK AT SNF-   CrCl calculated by me-  78.6       Dose for 14 days-  75 mg daily Pt will be monitored daily for flu like symptoms                                                                                Thurston Holenne D. Lyn HollingsheadAlexander, MD

## 2016-04-26 ENCOUNTER — Non-Acute Institutional Stay (SKILLED_NURSING_FACILITY): Payer: Medicare Other | Admitting: Internal Medicine

## 2016-04-26 DIAGNOSIS — R509 Fever, unspecified: Secondary | ICD-10-CM

## 2016-04-27 ENCOUNTER — Encounter: Payer: Self-pay | Admitting: Internal Medicine

## 2016-04-27 LAB — BASIC METABOLIC PANEL
BUN: 9 mg/dL (ref 4–21)
Creatinine: 0.8 mg/dL (ref 0.5–1.1)
Glucose: 89 mg/dL
POTASSIUM: 2.8 mmol/L — AB (ref 3.4–5.3)
Sodium: 139 mmol/L (ref 137–147)

## 2016-04-27 LAB — CBC AND DIFFERENTIAL
HCT: 39 % (ref 36–46)
HEMOGLOBIN: 12.9 g/dL (ref 12.0–16.0)
PLATELETS: 209 10*3/uL (ref 150–399)
WBC: 21.6 10*3/mL

## 2016-04-28 LAB — BASIC METABOLIC PANEL
BUN: 9 mg/dL (ref 4–21)
Creatinine: 0.7 mg/dL (ref 0.5–1.1)
Glucose: 113 mg/dL
POTASSIUM: 3.9 mmol/L (ref 3.4–5.3)
SODIUM: 140 mmol/L (ref 137–147)

## 2016-04-28 LAB — CBC AND DIFFERENTIAL
HEMATOCRIT: 36 % (ref 36–46)
HEMOGLOBIN: 11.6 g/dL — AB (ref 12.0–16.0)
Platelets: 209 10*3/uL (ref 150–399)
WBC: 21.9 10^3/mL

## 2016-04-29 ENCOUNTER — Non-Acute Institutional Stay (SKILLED_NURSING_FACILITY): Payer: Medicare Other | Admitting: Internal Medicine

## 2016-04-29 ENCOUNTER — Encounter: Payer: Self-pay | Admitting: Internal Medicine

## 2016-04-29 DIAGNOSIS — R509 Fever, unspecified: Secondary | ICD-10-CM

## 2016-04-29 LAB — HEPATIC FUNCTION PANEL
ALK PHOS: 105 U/L (ref 25–125)
ALT: 15 U/L (ref 7–35)
AST: 24 U/L (ref 13–35)
Bilirubin, Total: 0.5 mg/dL

## 2016-04-29 NOTE — Progress Notes (Signed)
Location[facility] Room Number: [phone] Place of Service:  SNF [phone]  Randon Goldsmithnne D. Lyn HollingsheadAlexander, MD  Patient Care Team: Shellia CleverlyLori M Beane, [place] as PCP - General [facility](General Practice)  Extended Emergency Contact Information Primary Emergency Contact: Docia BarrierHopkins,Tammy          [name] [phone] [place] Home Phone: [phone] Relation: Niece Secondary Emergency Contact: [name],Donna  [place] Home Phone: [phone] Relation: Niece    Allergies: Azithromycin; Cephalexin; Cephalosporins; Ciprofloxacin; Darvon [propoxyphene]; Flagyl [metronidazole]; Librax [chlordiazepoxide-clidinium]; Macrodantin [nitrofurantoin macrocrystal]; Nitrofuran derivatives; Other; Oxycodone-acetaminophen; Percocet [oxycodone-acetaminophen]; Quetiapine; Sulfa antibiotics; and Sulfamethoxazole  Chief Complaint  Patient presents with  . Acute Visit    Acute    HPI: Patient is 76 y.o. female who is being seen today in f/u from Friday. Pt was started on Rocephin 3/18 for symptoms of change in mental status. There has been no fever. CXR was negative. Urine was only gotten this am after being ordered on Friday.  Past Medical History:  Diagnosis Date  . Anemia   . Anxiety   . Arthritis   . Asthma   . Barrett's esophagus   . Chronic kidney disease   . COPD (chronic obstructive pulmonary disease) (HCC)   . Dementia   . Depression   . Disease of thyroid gland   . Diverticulitis   . GERD (gastroesophageal reflux disease)   . Hyperlipemia   . Hypertension   . Memory difficulties   . Osteoporosis   . Pancreatitis   . Psoriasis (a type of skin inflammation)   . Vertigo     Past Surgical History:  Procedure Laterality Date  . BACK SURGERY    . brain shun    . CHOLECYSTECTOMY    . ROTATOR CUFF REPAIR    . SHOULDER SURGERY    . UPPER GI ENDOSCOPY  12/19/2015   [facility] End[facility], Include esophagus, stomach & duodenum or / Jejunum: Dx w/wo biopsys and dilatation;  Surgeon : Leonia ReevesAlbert John Rhoton MD    Allergies as of 04/29/2016      Reactions   Azithromycin Other (See Comments)   unknown   Cephalexin Other (See Comments)   unknown   Cephalosporins    Ciprofloxacin Other (See Comments)   unknown   Darvon [propoxyphene]    Flagyl [metronidazole] Other (See Comments)   unknown   Librax [chlordiazepoxide-clidinium] Other (See Comments)   unknown   Macrodantin [nitrofurantoin Macrocrystal]    Nitrofuran Derivatives Other (See Comments)   unknown   Other Other (See Comments)   unknown   Oxycodone-acetaminophen Other (See Comments)   unknown   Percocet [oxycodone-acetaminophen]    Quetiapine Other (See Comments)   unknown Potassium level dropped.   Sulfa Antibiotics    Sulfamethoxazole Other (See Comments)   unknown      Medication List       Accurate as of 04/29/16  4:21 PM. Always use your most recent med list.          acetaminophen 325 MG tablet Commonly known as:  TYLENOL Take 650 mg by mouth every morning. Also give 2 tables by mouth (650 mg) every afternoon   acetaminophen 325 MG tablet Commonly known as:  TYLENOL Take 650 mg by mouth every 8 (eight) hours as needed.   albuterol 108 (90 Base) MCG/ACT inhaler Commonly known as:  PROVENTIL HFA;VENTOLIN HFA Inhale 2 puffs into the lungs every 6 (six) hours as needed for wheezing.   CEREFOLIN PO Take 1  capsule by mouth daily.   clonazePAM 0.5 MG tablet Commonly known as:  KLONOPIN Take 0.5 mg by mouth every morning. Also give 1 tablet by mouth daily at 4 pm hold for somnolence. Give 2 tablets (1 mg) by mouth at bedtime for anxiety   D3-1000 1000 units tablet Generic drug:  Cholecalciferol Take 1,000 Units by mouth daily.   divalproex 250 MG DR tablet Commonly known as:  DEPAKOTE Take 250 mg by mouth 2 (two) times daily.   donepezil 10 MG tablet Commonly known as:  ARICEPT Take 10 mg by mouth at bedtime.   ENSURE ENLIVE PO Take 237 mLs by mouth 2 (two) times daily  between meals.   levothyroxine 112 MCG tablet Commonly known as:  SYNTHROID, LEVOTHROID Take 112 mcg by mouth daily before breakfast. 6 am   OLANZapine 5 MG tablet Commonly known as:  ZYPREXA Take 5 mg by mouth at bedtime.   oxybutynin 5 MG tablet Commonly known as:  DITROPAN Take 5 mg by mouth every evening.   pantoprazole 40 MG tablet Commonly known as:  PROTONIX Take 40 mg by mouth 2 (two) times daily.   ranitidine 75 MG tablet Commonly known as:  ZANTAC Take 75 mg by mouth at bedtime.   theophylline 300 MG 12 hr tablet Commonly known as:  THEODUR Take 300 mg by mouth every 12 (twelve) hours.   venlafaxine 37.5 MG tablet Commonly known as:  EFFEXOR Take 75 mg by mouth daily. 2 capsules       No orders of the defined types were placed in this encounter.   Immunization History  Administered Date(s) Administered  . Influenza, High Dose Seasonal PF 12/20/2014  . Influenza, Seasonal, Injecte, Preservative Fre 10/10/2014  . Influenza-Unspecified 12/07/2015  . PPD Test 02/10/2016, 02/26/2016  . Pneumococcal Conjugate-13 12/20/2014  . Td 08/26/2014    Social History  Substance Use Topics  . Smoking status: Former Smoker    Quit date: 12/17/1990  . Smokeless tobacco: Never Used  . Alcohol use No    Review of Systems  DATA OBTAINED: from patient, nurse GENERAL:  no fevers, fatigue, appetite changes SKIN: No itching, rash HEENT: No complaint RESPIRATORY: No cough, wheezing, SOB CARDIAC: No chest pain, palpitations, lower extremity edema  GI: No abdominal pain, No N/V/D or constipation, No heartburn or reflux  GU: No dysuria, frequency or urgency, or incontinence  MUSCULOSKELETAL: No unrelieved bone/joint pain NEUROLOGIC: No headache, dizziness  PSYCHIATRIC: No overt anxiety or sadness  Vitals:   04/29/16 1619  BP: 114/78  Pulse: 80  Resp: 20  Temp: (!) 96.8 F (36 C)   Body mass index is 29.74 kg/m. Physical Exam  GENERAL APPEARANCE: Alert, min  conversant,appears ill, not toxic  SKIN: No diaphoresis rash HEENT: Unremarkable RESPIRATORY: Breathing is even, unlabored. Lung sounds are clear   CARDIOVASCULAR: Heart RRR no murmurs, rubs or gallops. No peripheral edema  GASTROINTESTINAL: Abdomen is soft, non-tender, not distended w/ normal bowel sounds.  GENITOURINARY: Bladder non tender, not distended  MUSCULOSKELETAL: No abnormal joints or musculature NEUROLOGIC: Cranial nerves 2-12 grossly intact. Moves all extremities PSYCHIATRIC: Mood and affect appropriate to situation, no behavioral issues  Patient Active Problem List   Diagnosis Date Noted  . Elevated amylase and lipase 02/13/2016  . Acute encephalopathy 02/13/2016  . Psychosis 02/13/2016  . Dementia with behavioral disturbance 02/13/2016  . Overactive bladder 02/13/2016  . Chronic kidney disease   . COPD (chronic obstructive pulmonary disease) (HCC)   . Anemia   .  Anxiety   . Barrett's esophagus   . Hypothyroidism   . GERD (gastroesophageal reflux disease)   . Hypertension   . Memory difficulties   . Osteoporosis   . Psoriasis (a type of skin inflammation)     CMP     Component Value Date/Time   NA 140 04/28/2016   K 3.9 04/28/2016   CL 100 (L) 01/26/2016 1935   CO2 30 01/26/2016 1935   GLUCOSE 115 (H) 01/26/2016 1935   BUN 9 04/28/2016   CREATININE 0.7 04/28/2016   CREATININE 0.83 01/26/2016 1935   CALCIUM 9.6 01/26/2016 1935   AST 30 02/15/2016   ALT 20 02/15/2016   ALKPHOS 60 02/15/2016   GFRNONAA >60 01/26/2016 1935   GFRAA >60 01/26/2016 1935    Recent Labs  01/26/16 1935 01/26/16 1940  02/22/16 04/27/16 04/28/16  NA 139  --   < > 147 139 140  K 2.3*  --   < > 3.5 2.8* 3.9  CL 100*  --   --   --   --   --   CO2 30  --   --   --   --   --   GLUCOSE 115*  --   --   --   --   --   BUN 10  --   < > 6 9 9   CREATININE 0.83  --   < > 0.7 0.8 0.7  CALCIUM 9.6  --   --   --   --   --   MG  --  2.2  --   --   --   --   < > = values in this  interval not displayed.  Recent Labs  02/02/16 02/05/16 02/15/16  AST 51* 29 30  ALT 30 21 20   ALKPHOS 54 58 60    Recent Labs  01/26/16 1935  02/22/16 04/27/16 04/28/16  WBC 8.1  < > 7.1 21.6 21.9  NEUTROABS 5.7  --   --   --   --   HGB 11.8*  < > 12.0 12.9 11.6*  HCT 34.6*  < > 37 39 36  MCV 96.4  --   --   --   --   PLT 286  < > 191 209 209  < > = values in this interval not displayed. No results for input(s): CHOL, LDLCALC, TRIG in the last 8760 hours.  Invalid input(s): HCL No results found for: MICROALBUR No results found for: TSH No results found for: HGBA1C No results found for: CHOL, HDL, LDLCALC, LDLDIRECT, TRIG, CHOLHDL  Significant Diagnostic Results in last 30 days:  No results found.  Assessment and Plan  ILLNESS - pt is on emperic rocephin. A U/A is really  Needed.  WBC from Friday 21. Family would like pt to go to ED. Without a known source, urine just now sent and ni improvement with 2 days abx, I can't disagree with them. Pt to be sent to hospital.     Randon Goldsmith. [name], MD

## 2016-05-01 ENCOUNTER — Encounter: Payer: Self-pay | Admitting: Internal Medicine

## 2016-05-01 NOTE — Progress Notes (Signed)
Location:  [facility] Room Number: [phone] Place of Service:  SNF [phone])  Randon Goldsmith. [name], MD  Patient Care Team: Shellia Cleverly, [place] as PCP - [facility])  Extended Emergency Contact Information Primary Emergency Contact: [name] [phone] [name] of [place] Home Phone: [phone] Relation: Niece Secondary Emergency Contact: Dorice Lamas [place] of [place] Home Phone: [phone] Relation: Niece    Allergies: Azithromycin; Cephalexin; Cephalosporins; Ciprofloxacin; Darvon [propoxyphene]; Flagyl [metronidazole]; Librax [chlordiazepoxide-clidinium]; Macrodantin [nitrofurantoin macrocrystal]; Nitrofuran derivatives; Other; Oxycodone-acetaminophen; Percocet [oxycodone-acetaminophen]; Quetiapine; Sulfa antibiotics; and Sulfamethoxazole  Chief Complaint  Patient presents with  . Acute Visit    Acute    HPI: Patient is 76 y.o. female who is being seen because she is having a chill. Fever was 102 axillary. Pt with baseline confusion bit is awake and speaking. She was not noted to be different in any way prior to this episode. Pt denies SOB, or pain , no one has heard her cough. She has had no choking episodes.  Past Medical History:  Diagnosis Date  . Anemia   . Anxiety   . Arthritis   . Asthma   . Barrett's esophagus   . Chronic kidney disease   . COPD (chronic obstructive pulmonary disease) (HCC)   . Dementia   . Depression   . Disease of thyroid gland   . Diverticulitis   . GERD (gastroesophageal reflux disease)   . Hyperlipemia   . Hypertension   . Memory difficulties   . Osteoporosis   . Pancreatitis   . Psoriasis (a type of skin inflammation)   . Vertigo     Past Surgical History:  Procedure Laterality Date  . BACK SURGERY    . brain shun    . CHOLECYSTECTOMY    . ROTATOR CUFF REPAIR    . SHOULDER SURGERY    . UPPER GI ENDOSCOPY  12/19/2015   [facility] End[facility], Include esophagus, stomach &  duodenum or / Jejunum: Dx w/wo biopsys and dilatation; Surgeon : Leonia Reeves Rhoton MD    Allergies as of 04/26/2016      Reactions   Azithromycin Other (See Comments)   unknown   Cephalexin Other (See Comments)   unknown   Cephalosporins    Ciprofloxacin Other (See Comments)   unknown   Darvon [propoxyphene]    Flagyl [metronidazole] Other (See Comments)   unknown   Librax [chlordiazepoxide-clidinium] Other (See Comments)   unknown   Macrodantin [nitrofurantoin Macrocrystal]    Nitrofuran Derivatives Other (See Comments)   unknown   Other Other (See Comments)   unknown   Oxycodone-acetaminophen Other (See Comments)   unknown   Percocet [oxycodone-acetaminophen]    Quetiapine Other (See Comments)   unknown Potassium level dropped.   Sulfa Antibiotics    Sulfamethoxazole Other (See Comments)   unknown      Medication List       Accurate as of 04/26/16 11:59 PM. Always use your most recent med list.          acetaminophen 325 MG tablet Commonly known as:  TYLENOL Take 650 mg by mouth every morning. Also give 2 tables by mouth (650 mg) every afternoon   acetaminophen 325 MG tablet Commonly known as:  TYLENOL Take 650 mg by mouth every 8 (eight) hours as needed.   albuterol 108 (90 Base) MCG/ACT inhaler Commonly known as:  PROVENTIL HFA;VENTOLIN HFA Inhale 2 puffs into the lungs every  6 (six) hours as needed for wheezing.   CEREFOLIN PO Take 1 capsule by mouth daily.   clonazePAM 0.5 MG tablet Commonly known as:  KLONOPIN Take 0.5 mg by mouth every morning. Also give 1 tablet by mouth daily at 4 pm hold for somnolence. Give 2 tablets (1 mg) by mouth at bedtime for anxiety   D3-1000 1000 units tablet Generic drug:  Cholecalciferol Take 1,000 Units by mouth daily.   divalproex 250 MG DR tablet Commonly known as:  DEPAKOTE Take 250 mg by mouth 2 (two) times daily.   donepezil 10 MG tablet Commonly known as:  ARICEPT Take 10 mg by mouth at bedtime.     ENSURE ENLIVE PO Take 237 mLs by mouth 2 (two) times daily between meals.   levothyroxine 112 MCG tablet Commonly known as:  SYNTHROID, LEVOTHROID Take 112 mcg by mouth daily before breakfast. 6 am   OLANZapine 5 MG tablet Commonly known as:  ZYPREXA Take 5 mg by mouth at bedtime.   oxybutynin 5 MG tablet Commonly known as:  DITROPAN Take 5 mg by mouth every evening.   pantoprazole 40 MG tablet Commonly known as:  PROTONIX Take 40 mg by mouth 2 (two) times daily.   ranitidine 75 MG tablet Commonly known as:  ZANTAC Take 75 mg by mouth at bedtime.   theophylline 300 MG 12 hr tablet Commonly known as:  THEODUR Take 300 mg by mouth every 12 (twelve) hours.   venlafaxine 37.5 MG tablet Commonly known as:  EFFEXOR Take 75 mg by mouth daily. 2 capsules       No orders of the defined types were placed in this encounter.   Immunization History  Administered Date(s) Administered  . Influenza, High Dose Seasonal PF 12/20/2014  . Influenza, Seasonal, Injecte, Preservative Fre 10/10/2014  . Influenza-Unspecified 12/07/2015  . PPD Test 02/10/2016, 02/26/2016  . Pneumococcal Conjugate-13 12/20/2014  . Td 08/26/2014    Social History  Substance Use Topics  . Smoking status: Former Smoker    Quit date: 12/17/1990  . Smokeless tobacco: Never Used  . Alcohol use No    Review of Systems  DATA OBTAINED: from patient, nurse- as per HPI GENERAL:  + fevers,no  fatigue, appetite changes SKIN: No itching, rash HEENT: No complaint RESPIRATORY: No cough, wheezing, SOB CARDIAC: No chest pain, palpitations, lower extremity edema  GI: No abdominal pain, No N/V/D or constipation, No heartburn or reflux  GU: No dysuria, frequency or urgency, or incontinence  MUSCULOSKELETAL: No unrelieved bone/joint pain NEUROLOGIC: No headache, dizziness  PSYCHIATRIC: No overt anxiety or sadness  Vitals:   04/26/16 1112  BP: 114/78  Pulse: 80  Resp: 20  Temp: (!) 96.8 F (36 C)   Body  mass index is 29.74 kg/m. Physical Exam  GENERAL APPEARANCE: Alert, conversant,looks like new onset illness but not toxic SKIN: No diaphoresis rash HEENT: Unremarkable RESPIRATORY: Breathing is even, unlabored. Lung sounds are clear   CARDIOVASCULAR: Heart RRR no murmurs, rubs or gallops. No peripheral edema  GASTROINTESTINAL: Abdomen is soft, non-tender, not distended w/ normal bowel sounds.  GENITOURINARY: Bladder non tender, not distended  MUSCULOSKELETAL: No abnormal joints or musculature NEUROLOGIC: Cranial nerves 2-12 grossly intact. Moves all extremities PSYCHIATRIC: Mood and affect appropriate to situation, no behavioral issues  Patient Active Problem List   Diagnosis Date Noted  . Elevated amylase and lipase 02/13/2016  . Acute encephalopathy 02/13/2016  . Psychosis 02/13/2016  . Dementia with behavioral disturbance 02/13/2016  . Overactive bladder 02/13/2016  .  Chronic kidney disease   . COPD (chronic obstructive pulmonary disease) (HCC)   . Anemia   . Anxiety   . Barrett's esophagus   . Hypothyroidism   . GERD (gastroesophageal reflux disease)   . Hypertension   . Memory difficulties   . Osteoporosis   . Psoriasis (a type of skin inflammation)     CMP     Component Value Date/Time   NA 140 04/28/2016   K 3.9 04/28/2016   CL 100 (L) 01/26/2016 1935   CO2 30 01/26/2016 1935   GLUCOSE 115 (H) 01/26/2016 1935   BUN 9 04/28/2016   CREATININE 0.7 04/28/2016   CREATININE 0.83 01/26/2016 1935   CALCIUM 9.6 01/26/2016 1935   AST 30 02/15/2016   ALT 20 02/15/2016   ALKPHOS 60 02/15/2016   GFRNONAA >60 01/26/2016 1935   GFRAA >60 01/26/2016 1935    Recent Labs  01/26/16 1935 01/26/16 1940  02/22/16 04/27/16 04/28/16  NA 139  --   < > 147 139 140  K 2.3*  --   < > 3.5 2.8* 3.9  CL 100*  --   --   --   --   --   CO2 30  --   --   --   --   --   GLUCOSE 115*  --   --   --   --   --   BUN 10  --   < > 6 9 9   CREATININE 0.83  --   < > 0.7 0.8 0.7  CALCIUM  9.6  --   --   --   --   --   MG  --  2.2  --   --   --   --   < > = values in this interval not displayed.  Recent Labs  02/02/16 02/05/16 02/15/16  AST 51* 29 30  ALT 30 21 20   ALKPHOS 54 58 60    Recent Labs  01/26/16 1935  02/22/16 04/27/16 04/28/16  WBC 8.1  < > 7.1 21.6 21.9  NEUTROABS 5.7  --   --   --   --   HGB 11.8*  < > 12.0 12.9 11.6*  HCT 34.6*  < > 37 39 36  MCV 96.4  --   --   --   --   PLT 286  < > 191 209 209  < > = values in this interval not displayed. No results for input(s): CHOL, LDLCALC, TRIG in the last 8760 hours.  Invalid input(s): HCL No results found for: MICROALBUR No results found for: TSH No results found for: HGBA1C No results found for: CHOL, HDL, LDLCALC, LDLDIRECT, TRIG, CHOLHDL  Significant Diagnostic Results in last 30 days:  No results found.  Assessment and Plan  FEBRILE ILLNESS - no supporting clues except fever; have ordered CXR, U/A, CBC,BMP and flu test. Will monitor pt and tests.  Late entry - CXR returned - neg for infiltrate.    Time spent > 35 min;> 50% of time with patient was spent reviewing records, labs, tests and studies, counseling and developing plan of care  Randon Goldsmithnne D. Lyn HollingsheadAlexander, MD

## 2016-05-02 LAB — BASIC METABOLIC PANEL
BUN: 6 mg/dL (ref 4–21)
Creatinine: 0.6 mg/dL (ref 0.5–1.1)
POTASSIUM: 3.8 mmol/L (ref 3.4–5.3)
SODIUM: 139 mmol/L (ref 137–147)

## 2016-05-03 LAB — CBC AND DIFFERENTIAL
HEMATOCRIT: 36 % (ref 36–46)
Hemoglobin: 11.8 g/dL — AB (ref 12.0–16.0)
PLATELETS: 345 10*3/uL (ref 150–399)
WBC: 8.4 10^3/mL

## 2016-05-03 LAB — BASIC METABOLIC PANEL
BUN: 5 mg/dL (ref 4–21)
Creatinine: 0.7 mg/dL (ref 0.5–1.1)
GLUCOSE: 101 mg/dL
Potassium: 4.3 mmol/L (ref 3.4–5.3)
SODIUM: 139 mmol/L (ref 137–147)

## 2016-05-06 ENCOUNTER — Non-Acute Institutional Stay (SKILLED_NURSING_FACILITY): Payer: Medicare Other | Admitting: Internal Medicine

## 2016-05-06 ENCOUNTER — Encounter: Payer: Self-pay | Admitting: Internal Medicine

## 2016-05-06 DIAGNOSIS — K2100 Gastro-esophageal reflux disease with esophagitis, without bleeding: Secondary | ICD-10-CM

## 2016-05-06 DIAGNOSIS — G9341 Metabolic encephalopathy: Secondary | ICD-10-CM

## 2016-05-06 DIAGNOSIS — G301 Alzheimer's disease with late onset: Secondary | ICD-10-CM | POA: Diagnosis not present

## 2016-05-06 DIAGNOSIS — N3000 Acute cystitis without hematuria: Secondary | ICD-10-CM

## 2016-05-06 DIAGNOSIS — J189 Pneumonia, unspecified organism: Secondary | ICD-10-CM

## 2016-05-06 DIAGNOSIS — I480 Paroxysmal atrial fibrillation: Secondary | ICD-10-CM | POA: Diagnosis not present

## 2016-05-06 DIAGNOSIS — F0281 Dementia in other diseases classified elsewhere with behavioral disturbance: Secondary | ICD-10-CM | POA: Diagnosis not present

## 2016-05-06 DIAGNOSIS — J441 Chronic obstructive pulmonary disease with (acute) exacerbation: Secondary | ICD-10-CM | POA: Diagnosis not present

## 2016-05-06 DIAGNOSIS — J181 Lobar pneumonia, unspecified organism: Secondary | ICD-10-CM

## 2016-05-06 DIAGNOSIS — F29 Unspecified psychosis not due to a substance or known physiological condition: Secondary | ICD-10-CM

## 2016-05-06 NOTE — Progress Notes (Signed)
: Provider:  Randon Goldsmith. [name], MD Location:  [facility] Room Number: 208D Place of Service:  SNF [phone])  PCP: Shellia Cleverly, PA Patient Care Team: Shellia Cleverly, [place] as PCP - General [facility])  Extended Emergency Contact Information Primary Emergency Contact: [name] [phone] [name] of [place] Home Phone: [phone] Relation: Niece Secondary Emergency Contact: Dorice Lamas [place] of [place] Home Phone: [phone] Relation: Niece     Allergies: Azithromycin; Cephalexin; Cephalosporins; Ciprofloxacin; Darvon [propoxyphene]; Flagyl [metronidazole]; Librax [chlordiazepoxide-clidinium]; Macrodantin [nitrofurantoin macrocrystal]; Nitrofuran derivatives; Other; Oxycodone-acetaminophen; Percocet [oxycodone-acetaminophen]; Quetiapine; Sulfa antibiotics; and Sulfamethoxazole  Chief Complaint  Patient presents with  . Readmit To SNF    HPI: Patient is 76 y.o. female with COPD, hyperlipidemia, hypertension, pancreatitis, chronic kidney disease, anemia, who presented to the emergency department from the nursing home with complaint of altered mental status. Patient had a fever of 102 on Friday and labs were done chest x-ray and UA. Patient was started on IM Rocephin on 04/28/2016. Patient continued to have decrease in by mouth intake and was sent to the emergency department for admission. Patient was admitted to [facility] from March 19-23 for a right lower lobe pneumonia which was not seen on the chest x-ray done at the skilled nursing facility and UTI which was treated but remained culture negative. Hospital course was complicated by paroxysmal atrial fib with RVR which converted to normal sinus rhythm with amiodarone and Cardizem and metabolic encephalopathy and delirium likely secondary to pneumonia and UTI with underlying dementia. Patient is admitted to SNF with generalized weakness for OT PT. While at  skilled nursing facility patient will be followed for dementia treated with Aricept, psychosis treated with Zyprexa and GERD. Treated with Protonix and Zantac.  Past Medical History:  Diagnosis Date  . Anemia   . Anxiety   . Arthritis   . Asthma   . Barrett's esophagus   . Chronic kidney disease   . COPD (chronic obstructive pulmonary disease) (HCC)   . Dementia   . Depression   . Disease of thyroid gland   . Diverticulitis   . GERD (gastroesophageal reflux disease)   . Hyperlipemia   . Hypertension   . Memory difficulties   . Osteoporosis   . Pancreatitis   . Psoriasis (a type of skin inflammation)   . Vertigo     Past Surgical History:  Procedure Laterality Date  . BACK SURGERY    . brain shun    . CHOLECYSTECTOMY    . ROTATOR CUFF REPAIR    . SHOULDER SURGERY    . UPPER GI ENDOSCOPY  12/19/2015   [facility] End[facility], Include esophagus, stomach & duodenum or / Jejunum: Dx w/wo biopsys and dilatation; Surgeon : Leonia Reeves Rhoton MD    Allergies as of 05/06/2016      Reactions   Azithromycin Other (See Comments)   unknown   Cephalexin Other (See Comments)   unknown   Cephalosporins    Ciprofloxacin Other (See Comments)   unknown   Darvon [propoxyphene]    Flagyl [metronidazole] Other (See Comments)   unknown   Librax [chlordiazepoxide-clidinium] Other (See Comments)   unknown   Macrodantin [nitrofurantoin Macrocrystal]    Nitrofuran Derivatives Other (See Comments)   unknown   Other Other (See Comments)   unknown   Oxycodone-acetaminophen Other (See Comments)   unknown   Percocet [oxycodone-acetaminophen]    Quetiapine Other (  See Comments)   unknown Potassium level dropped.   Sulfa Antibiotics    Sulfamethoxazole Other (See Comments)   unknown      Medication List       Accurate as of 05/06/16 11:59 PM. Always use your most recent med list.          acetaminophen 325 MG tablet Commonly known as:  TYLENOL Take 650 mg by mouth every 8 (eight) hours as  needed.   albuterol 108 (90 Base) MCG/ACT inhaler Commonly known as:  PROVENTIL HFA;VENTOLIN HFA Inhale 2 puffs into the lungs every 6 (six) hours as needed for wheezing.   amiodarone 200 MG tablet Commonly known as:  PACERONE Take 200 mg by mouth 2 (two) times daily.   CEREFOLIN PO Take 1 capsule by mouth daily.   clonazePAM 0.5 MG tablet Commonly known as:  KLONOPIN Take 0.5 mg by mouth 2 (two) times daily as needed.   clonazePAM 1 MG tablet Commonly known as:  KLONOPIN Take 2 mg by mouth at bedtime. 2 tablets   D3-1000 1000 units tablet Generic drug:  Cholecalciferol Take 1,000 Units by mouth daily.   diltiazem 120 MG 24 hr capsule Commonly known as:  TIAZAC Take 120 mg by mouth daily.   divalproex 250 MG DR tablet Commonly known as:  DEPAKOTE Take 250 mg by mouth 2 (two) times daily.   donepezil 10 MG tablet Commonly known as:  ARICEPT Take 10 mg by mouth at bedtime.   KETOCONAZOLE (BULK) Powd Apply 1 application topically 2 times daily as needed   levofloxacin 750 MG tablet Commonly known as:  LEVAQUIN Take 750 mg by mouth daily. For 3 days   OLANZapine 5 MG tablet Commonly known as:  ZYPREXA Take 5 mg by mouth at bedtime.   pantoprazole 40 MG tablet Commonly known as:  PROTONIX Take 40 mg by mouth 2 (two) times daily.   potassium chloride 20 MEQ packet Commonly known as:  KLOR-CON Take 40 mEq by mouth 2 (two) times daily.   ranitidine 75 MG tablet Commonly known as:  ZANTAC Take 75 mg by mouth at bedtime.   theophylline 300 MG 12 hr tablet Commonly known as:  THEODUR Take 300 mg by mouth every 12 (twelve) hours.   venlafaxine 37.5 MG tablet Commonly known as:  EFFEXOR Take 37.5 mg by mouth daily.       Meds ordered this encounter  Medications  . amiodarone (PACERONE) 200 MG tablet    Sig: Take 200 mg by mouth 2 (two) times daily.  Marland Kitchen. diltiazem (TIAZAC) 120 MG 24 hr capsule    Sig: Take 120 mg by mouth daily.  Marland Kitchen. levofloxacin (LEVAQUIN)  750 MG tablet    Sig: Take 750 mg by mouth daily. For 3 days  . clonazePAM (KLONOPIN) 1 MG tablet    Sig: Take 2 mg by mouth at bedtime. 2 tablets  . potassium chloride (KLOR-CON) 20 MEQ packet    Sig: Take 40 mEq by mouth 2 (two) times daily.  Marland Kitchen. KETOCONAZOLE, BULK, POWD    Sig: Apply 1 application topically 2 times daily as needed    Immunization History  Administered Date(s) Administered  . Influenza, High Dose Seasonal PF 12/20/2014  . Influenza, Seasonal, Injecte, Preservative Fre 10/10/2014  . Influenza-Unspecified 12/07/2015  . PPD Test 02/10/2016, 02/26/2016  . Pneumococcal Conjugate-13 12/20/2014  . Td 08/26/2014    Social History  Substance Use Topics  . Smoking status: Former Smoker    Quit date: 12/17/1990  .  Smokeless tobacco: Never Used  . Alcohol use No    Family history is   Family History  Problem Relation Age of Onset  . Arthritis Mother   . Heart disease Mother   . Hypertension Mother   . Cancer Father   . Arthritis Father   . Heart disease Father   . Alzheimer's disease Sister   . Arthritis Sister   . Heart disease Sister   . Memory loss Sister   . Cancer Brother   . Lung disease Brother   . Heart disease Brother       Review of Systems  DATA OBTAINED: from nurse, family member GENERAL:  no fevers, fatigue, appetite changes SKIN: No itching, or rash EYES: No eye pain, redness, discharge EARS: No earache, tinnitus, change in hearing NOSE: No congestion, drainage or bleeding  MOUTH/THROAT: No mouth or tooth pain, No sore throat RESPIRATORY: No cough, wheezing, SOB CARDIAC: No chest pain, palpitations, lower extremity edema  GI: No abdominal pain, No N/V/D or constipation, No heartburn or reflux  GU: No dysuria, frequency or urgency, or incontinence  MUSCULOSKELETAL: No unrelieved bone/joint pain NEUROLOGIC: No headache, dizziness or focal weakness PSYCHIATRIC: pt c/o hallucinations and someone trying to get in her room  Vitals:    05/06/16 1106  BP: 122/69  Pulse: (!) 103  Resp: 18  Temp: 97 F (36.1 C)    SpO2 Readings from Last 1 Encounters:  05/06/16 96%   Body mass index is 24.81 kg/m.     Physical Exam  GENERAL APPEARANCE: Alert, conversant,  No acute distress.  SKIN: No diaphoresis rash HEAD: Normocephalic, atraumatic  EYES: Conjunctiva/lids clear. Pupils round, reactive. EOMs intact.  EARS: External exam WNL, canals clear. Hearing grossly normal.  NOSE: No deformity or discharge.  MOUTH/THROAT: Lips w/o lesions  RESPIRATORY: Breathing is even, unlabored. Lung sounds are clear   CARDIOVASCULAR: Heart RRR no murmurs, rubs or gallops. No peripheral edema.   GASTROINTESTINAL: Abdomen is soft, non-tender, not distended w/ normal bowel sounds. GENITOURINARY: Bladder non tender, not distended  MUSCULOSKELETAL: No abnormal joints or musculature NEUROLOGIC:  Cranial nerves 2-12 grossly intact. Moves all extremities  PSYCHIATRIC: Mood and affectwith dementia, some confusion, no behavioral issues  Patient Active Problem List   Diagnosis Date Noted  . Elevated amylase and lipase 02/13/2016  . Acute encephalopathy 02/13/2016  . Psychosis 02/13/2016  . Dementia with behavioral disturbance 02/13/2016  . Overactive bladder 02/13/2016  . Chronic kidney disease   . COPD (chronic obstructive pulmonary disease) (HCC)   . Anemia   . Anxiety   . Barrett's esophagus   . Hypothyroidism   . GERD (gastroesophageal reflux disease)   . Hypertension   . Memory difficulties   . Osteoporosis   . Psoriasis (a type of skin inflammation)       Labs reviewed: Basic Metabolic Panel:    Component Value Date/Time   NA 139 05/03/2016   K 4.3 05/03/2016   CL 100 (L) 01/26/2016 1935   CO2 30 01/26/2016 1935   GLUCOSE 115 (H) 01/26/2016 1935   BUN 5 05/03/2016   CREATININE 0.7 05/03/2016   CREATININE 0.83 01/26/2016 1935   CALCIUM 9.6 01/26/2016 1935   AST 24 04/29/2016   ALT 15 04/29/2016   ALKPHOS 105  04/29/2016   GFRNONAA >60 01/26/2016 1935   GFRAA >60 01/26/2016 1935     Recent Labs  01/26/16 1935 01/26/16 1940  04/28/16 05/02/16 05/03/16  NA 139  --   < > 140 139  139  K 2.3*  --   < > 3.9 3.8 4.3  CL 100*  --   --   --   --   --   CO2 30  --   --   --   --   --   GLUCOSE 115*  --   --   --   --   --   BUN 10  --   < > 9 6 5   CREATININE 0.83  --   < > 0.7 0.6 0.7  CALCIUM 9.6  --   --   --   --   --   MG  --  2.2  --   --   --   --   < > = values in this interval not displayed. Liver Function Tests:  Recent Labs  02/05/16 02/15/16 04/29/16  AST 29 30 24   ALT 21 20 15   ALKPHOS 58 60 105    Recent Labs  01/26/16 1935  LIPASE 319*   No results for input(s): AMMONIA in the last 8760 hours. CBC:  Recent Labs  01/26/16 1935  04/27/16 04/28/16 05/03/16  WBC 8.1  < > 21.6 21.9 8.4  NEUTROABS 5.7  --   --   --   --   HGB 11.8*  < > 12.9 11.6* 11.8*  HCT 34.6*  < > 39 36 36  MCV 96.4  --   --   --   --   PLT 286  < > 209 209 345  < > = values in this interval not displayed. Lipid No results for input(s): CHOL, HDL, LDLCALC, TRIG in the last 8760 hours.  Cardiac Enzymes: No results for input(s): CKTOTAL, CKMB, CKMBINDEX, TROPONINI in the last 8760 hours. BNP: No results for input(s): BNP in the last 8760 hours. No results found for: MICROALBUR No results found for: HGBA1C No results found for: TSH No results found for: VITAMINB12 No results found for: FOLATE No results found for: IRON, TIBC, FERRITIN  Imaging and Procedures obtained prior to SNF admission: Ct Abdomen Pelvis W Contrast  Result Date: 01/26/2016 CLINICAL DATA:  Lower abdominal pain and low back pain EXAM: CT ABDOMEN AND PELVIS WITH CONTRAST TECHNIQUE: Multidetector CT imaging of the abdomen and pelvis was performed using the standard protocol following bolus administration of intravenous contrast. CONTRAST:  ISOVUE-300 IOPAMIDOL (ISOVUE-300) INJECTION 61% COMPARISON:  Chest radiograph  11/01/2007 FINDINGS: Lower chest: No pulmonary nodules. No visible pleural or pericardial effusion. Hepatobiliary: Normal hepatic size and contours without focal liver lesion. No perihepatic ascites. No intra- or extrahepatic biliary dilatation. Status post cholecystectomy. Pancreas: There is inflammatory stranding surrounding the head of the pancreas. No peripancreatic fluid collection. The pancreatic duct is not dilated. Spleen: Normal. Adrenals/Urinary Tract: Normal adrenal glands. No hydronephrosis or solid renal mass. Stomach/Bowel: There is descending colonic and sigmoid diverticulosis without evidence of acute inflammation. No dilated small bowel. No abdominal fluid collection. Normal appendix. Vascular/Lymphatic: There is atherosclerotic calcification of the non aneurysmal abdominal aorta. No abdominal or pelvic adenopathy. Reproductive: Normal uterus and ovaries for age. Musculoskeletal: There is compression deformity of the T8 vertebral body, favored to be chronic. This appears unchanged relative to the radiograph of 11/01/2007 Normal visualized extrathoracic and extraperitoneal soft tissues. Other: No contributory non-categorized findings. IMPRESSION: 1. Acute pancreatitis without evidence of pancreatic necrosis or peripancreatic fluid collection. 2. Aortic atherosclerosis. 3. Descending colonic and sigmoid diverticulosis without acute diverticulitis. Electronically Signed   By: Chrisandra Netters.D.  On: 01/26/2016 21:57     Not all labs, radiology exams or other studies done during hospitalization come through on my EPIC note; however they are reviewed by me.    Assessment and Plan  RLL PNA,/ COPD EXACERBATION/UTI - tx with IV abx, O2,nebulizers; ABG did not show CO2 retention; pt is d/c with levaquin SNF - admitted for generalized weakness and OT/PT ; will cont levaquin 750 mg daily for 3 more days.  PAF WITH RVR - resolved to RSR with initiation of amiodarone and diltiazem; Cards rec short  course of amiodarone and no anticoag SNF - cont amiodarone 200 mg BID, diltiazem 240 mg daily  METABOLIC ENCEPHALOPATHY/DEMENTIA - felt 2/2 to above and dementia -improved SNF - plan to cont aricept 10 mg daily  PSYCHOSIS SNF- will inc zyprexa from 5 mg to 10 mg in response to pt's hallucinations  GERD SNF - not stated as uncontrolled; cont protonix 40 mg BID and zantac 75 mg qHS   Time spent > 45 min;> 50% of time with patient was spent reviewing records, labs, tests and studies, counseling and developing plan of care  Thurston Hole D. [name], MD

## 2016-05-09 LAB — BASIC METABOLIC PANEL
BUN: 9 mg/dL (ref 4–21)
Creatinine: 1 mg/dL (ref 0.5–1.1)
GLUCOSE: 97 mg/dL
Potassium: 4.7 mmol/L (ref 3.4–5.3)
Sodium: 142 mmol/L (ref 137–147)

## 2016-05-09 LAB — CBC AND DIFFERENTIAL
HEMATOCRIT: 42 % (ref 36–46)
HEMOGLOBIN: 13.6 g/dL (ref 12.0–16.0)
Platelets: 469 10*3/uL — AB (ref 150–399)
WBC: 9.5 10*3/mL

## 2016-05-10 ENCOUNTER — Encounter: Payer: Self-pay | Admitting: Internal Medicine

## 2016-05-10 DIAGNOSIS — N390 Urinary tract infection, site not specified: Secondary | ICD-10-CM | POA: Insufficient documentation

## 2016-05-10 DIAGNOSIS — J850 Gangrene and necrosis of lung: Secondary | ICD-10-CM | POA: Insufficient documentation

## 2016-05-10 DIAGNOSIS — J189 Pneumonia, unspecified organism: Secondary | ICD-10-CM

## 2016-05-10 DIAGNOSIS — I480 Paroxysmal atrial fibrillation: Secondary | ICD-10-CM

## 2016-05-10 DIAGNOSIS — G9341 Metabolic encephalopathy: Secondary | ICD-10-CM | POA: Insufficient documentation

## 2016-05-10 DIAGNOSIS — J441 Chronic obstructive pulmonary disease with (acute) exacerbation: Secondary | ICD-10-CM | POA: Insufficient documentation

## 2016-05-10 HISTORY — DX: Paroxysmal atrial fibrillation: I48.0

## 2016-05-24 LAB — VITAMIN D 25 HYDROXY (VIT D DEFICIENCY, FRACTURES): Vit D, 25-Hydroxy: 35.06

## 2016-05-24 LAB — HEPATIC FUNCTION PANEL
ALK PHOS: 84 (ref 25–125)
ALT: 12 (ref 7–35)
AST: 23 (ref 13–35)
Bilirubin, Total: 0.2

## 2016-05-24 LAB — BASIC METABOLIC PANEL
BUN: 17 (ref 4–21)
CREATININE: 0.8 (ref 0.5–1.1)
Glucose: 100
Potassium: 4.3 (ref 3.4–5.3)
SODIUM: 140 (ref 137–147)

## 2016-05-24 LAB — TSH: TSH: 49.35 — AB (ref 0.41–5.90)

## 2016-05-24 LAB — CBC AND DIFFERENTIAL
HCT: 43 (ref 36–46)
HEMOGLOBIN: 14.2 (ref 12.0–16.0)
PLATELETS: 201 (ref 150–399)
WBC: 13.2

## 2016-05-24 LAB — VITAMIN B12: Vitamin B-12: 1188

## 2016-05-25 ENCOUNTER — Encounter: Payer: Self-pay | Admitting: Internal Medicine

## 2016-05-29 ENCOUNTER — Non-Acute Institutional Stay (SKILLED_NURSING_FACILITY): Payer: Medicare Other | Admitting: Internal Medicine

## 2016-05-29 DIAGNOSIS — R410 Disorientation, unspecified: Secondary | ICD-10-CM

## 2016-05-29 DIAGNOSIS — E034 Atrophy of thyroid (acquired): Secondary | ICD-10-CM | POA: Diagnosis not present

## 2016-05-30 LAB — CBC AND DIFFERENTIAL
HCT: 42 % (ref 36–46)
HEMOGLOBIN: 13.9 g/dL (ref 12.0–16.0)
PLATELETS: 190 10*3/uL (ref 150–399)
WBC: 11.8 10*3/mL

## 2016-06-02 ENCOUNTER — Encounter: Payer: Self-pay | Admitting: Internal Medicine

## 2016-06-05 ENCOUNTER — Encounter: Payer: Self-pay | Admitting: Internal Medicine

## 2016-06-05 ENCOUNTER — Non-Acute Institutional Stay (SKILLED_NURSING_FACILITY): Payer: Medicare Other | Admitting: Internal Medicine

## 2016-06-05 DIAGNOSIS — I10 Essential (primary) hypertension: Secondary | ICD-10-CM

## 2016-06-05 DIAGNOSIS — K2100 Gastro-esophageal reflux disease with esophagitis, without bleeding: Secondary | ICD-10-CM

## 2016-06-05 DIAGNOSIS — J449 Chronic obstructive pulmonary disease, unspecified: Secondary | ICD-10-CM | POA: Diagnosis not present

## 2016-06-05 NOTE — Progress Notes (Signed)
Location:  [facility] Room Number: [phone] Place of Service:  SNF [phone])  Randon Goldsmith. [name], MD  Patient Care Team: Shellia Cleverly, [place] as PCP - General [facility])  Extended Emergency Contact Information Primary Emergency Contact: [name] [phone] [name] Home Phone: [phone] Relation: Niece Secondary Emergency Contact: Dorice Lamas [place] of [place] Home Phone: [phone] Relation: Niece    Allergies: Azithromycin; Cephalexin; Cephalosporins; Ciprofloxacin; Darvon [propoxyphene]; Flagyl [metronidazole]; Librax [chlordiazepoxide-clidinium]; Macrodantin [nitrofurantoin macrocrystal]; Nitrofuran derivatives; Other; Oxycodone-acetaminophen; Percocet [oxycodone-acetaminophen]; Quetiapine; Sulfa antibiotics; and Sulfamethoxazole  Chief Complaint  Patient presents with  . Medical Management of Chronic Issues    Routine Visit    HPI: Patient is 76 y.o. female who is being seen for routine issues of HTN, COPD and GERD.   Past Medical History:  Diagnosis Date  . Anemia   . Anxiety   . Arthritis   . Asthma   . Barrett's esophagus   . Chronic kidney disease   . COPD (chronic obstructive pulmonary disease) (HCC)   . Dementia   . Depression   . Disease of thyroid gland   . Diverticulitis   . GERD (gastroesophageal reflux disease)   . Hyperlipemia   . Hypertension   . Memory difficulties   . Osteoporosis   . Pancreatitis   . Psoriasis (a type of skin inflammation)   . Vertigo     Past Surgical History:  Procedure Laterality Date  . BACK SURGERY    . brain shun    . CHOLECYSTECTOMY    . ROTATOR CUFF REPAIR    . SHOULDER SURGERY    . UPPER GI ENDOSCOPY  12/19/2015   [facility] End[facility], Include esophagus, stomach & duodenum or / Jejunum: Dx w/wo biopsys and dilatation; Surgeon : Leonia Reeves Rhoton MD    Allergies as of 06/05/2016      Reactions   Azithromycin Other (See Comments)   unknown   Cephalexin Other (See Comments)   unknown   Cephalosporins    Ciprofloxacin Other (See Comments)   unknown   Darvon [propoxyphene]    Flagyl [metronidazole] Other (See Comments)   unknown   Librax [chlordiazepoxide-clidinium] Other (See Comments)   unknown   Macrodantin [nitrofurantoin Macrocrystal]    Nitrofuran Derivatives Other (See Comments)   unknown   Other Other (See Comments)   unknown   Oxycodone-acetaminophen Other (See Comments)   unknown   Percocet [oxycodone-acetaminophen]    Quetiapine Other (See Comments)   unknown Potassium level dropped.   Sulfa Antibiotics    Sulfamethoxazole Other (See Comments)   unknown      Medication List       Accurate as of 06/05/16 11:59 PM. Always use your most recent med list.          acetaminophen 325 MG tablet Commonly known as:  TYLENOL Take 650 mg by mouth every 8 (eight) hours as needed.   amiodarone 200 MG tablet Commonly known as:  PACERONE Take 200 mg by mouth 2 (two) times daily.   CEREFOLIN PO Take 1 capsule by mouth daily.   clonazePAM 0.5 MG tablet Commonly known as:  KLONOPIN Take 0.5 mg by mouth 2 (two) times daily as needed.   clonazePAM 1 MG tablet Commonly known as:  KLONOPIN Take 2 mg by mouth at bedtime. 2 tablets   D3-1000 1000 units tablet Generic drug:  Cholecalciferol Take 1,000 Units by mouth daily.  diltiazem 120 MG 24 hr capsule Commonly known as:  TIAZAC Take 120 mg by mouth daily.   divalproex 250 MG DR tablet Commonly known as:  DEPAKOTE Take 250 mg by mouth 2 (two) times daily.   donepezil 10 MG tablet Commonly known as:  ARICEPT Take 10 mg by mouth at bedtime.   ipratropium-albuterol 0.5-2.5 (3) MG/3ML Soln Commonly known as:  DUONEB Take 3 mLs by nebulization every 6 (six) hours as needed.   ipratropium-albuterol 0.5-2.5 (3) MG/3ML Soln Commonly known as:  DUONEB Take 3 mLs by nebulization 2 (two) times daily.   KETOCONAZOLE (BULK) Powd Apply 1 application  topically 2 times daily as needed   OLANZapine 10 MG tablet Commonly known as:  ZYPREXA Take 10 mg by mouth at bedtime.   pantoprazole 40 MG tablet Commonly known as:  PROTONIX Take 40 mg by mouth 2 (two) times daily.   potassium chloride 20 MEQ packet Commonly known as:  KLOR-CON Take 40 mEq by mouth 2 (two) times daily.   ranitidine 75 MG tablet Commonly known as:  ZANTAC Take 75 mg by mouth at bedtime.   theophylline 300 MG 12 hr tablet Commonly known as:  THEODUR Take 300 mg by mouth every 12 (twelve) hours.   venlafaxine 37.5 MG tablet Commonly known as:  EFFEXOR Take 37.5 mg by mouth daily.       No orders of the defined types were placed in this encounter.   Immunization History  Administered Date(s) Administered  . Influenza, High Dose Seasonal PF 12/20/2014  . Influenza, Seasonal, Injecte, Preservative Fre 10/10/2014  . Influenza-Unspecified 12/07/2015  . PPD Test 02/10/2016, 02/26/2016  . Pneumococcal Conjugate-13 12/20/2014  . Td 08/26/2014    Social History  Substance Use Topics  . Smoking status: Former Smoker    Quit date: 12/17/1990  . Smokeless tobacco: Never Used  . Alcohol use No    Review of Systems  DATA OBTAINED: from patient- can not fully participate; nursing with no new concerns GENERAL:  no fevers, fatigue, appetite changes SKIN: No itching, rash HEENT: No complaint RESPIRATORY: No cough, wheezing, SOB CARDIAC: No chest pain, palpitations, lower extremity edema  GI: No abdominal pain, No N/V/D or constipation, No heartburn or reflux  GU: No dysuria, frequency or urgency, or incontinence  MUSCULOSKELETAL: No unrelieved bone/joint pain NEUROLOGIC: No headache, dizziness  PSYCHIATRIC: No overt anxiety or sadness  Vitals:   06/05/16 0952  BP: 114/78  Pulse: 64  Resp: 20  Temp: 97 F (36.1 C)   Body mass index is 27.19 kg/m. Physical Exam  GENERAL APPEARANCE: Alert, conversant, No acute distress  SKIN: No diaphoresis  rash HEENT: Unremarkable RESPIRATORY: Breathing is even, unlabored. Lung sounds are clear   CARDIOVASCULAR: Heart RRR no murmurs, rubs or gallops. No peripheral edema  GASTROINTESTINAL: Abdomen is soft, non-tender, not distended w/ normal bowel sounds.  GENITOURINARY: Bladder non tender, not distended  MUSCULOSKELETAL: No abnormal joints or musculature NEUROLOGIC: Cranial nerves 2-12 grossly intact. Moves all extremities PSYCHIATRIC: Mood and affect with dementia, intermiddent confusion, no behavioral issues  Patient Active Problem List   Diagnosis Date Noted  . PNA (pneumonia) 05/10/2016  . COPD exacerbation (HCC) 05/10/2016  . UTI (urinary tract infection) 05/10/2016  . Paroxysmal atrial fibrillation with RVR (HCC) 05/10/2016  . Metabolic encephalopathy 05/10/2016  . Elevated amylase and lipase 02/13/2016  . Acute encephalopathy 02/13/2016  . Psychosis 02/13/2016  . Dementia with behavioral disturbance 02/13/2016  . Overactive bladder 02/13/2016  . Chronic kidney disease   .  COPD (chronic obstructive pulmonary disease) (HCC)   . Anemia   . Anxiety   . Barrett's esophagus   . Hypothyroidism   . GERD (gastroesophageal reflux disease)   . Hypertension   . Memory difficulties   . Osteoporosis   . Psoriasis (a type of skin inflammation)     CMP     Component Value Date/Time   NA 142 05/09/2016   K 4.7 05/09/2016   CL 100 (L) 01/26/2016 1935   CO2 30 01/26/2016 1935   GLUCOSE 115 (H) 01/26/2016 1935   BUN 9 05/09/2016   CREATININE 1.0 05/09/2016   CREATININE 0.83 01/26/2016 1935   CALCIUM 9.6 01/26/2016 1935   AST 24 04/29/2016   ALT 15 04/29/2016   ALKPHOS 105 04/29/2016   GFRNONAA >60 01/26/2016 1935   GFRAA >60 01/26/2016 1935    Recent Labs  01/26/16 1935 01/26/16 1940  05/02/16 05/03/16 05/09/16  NA 139  --   < > 139 139 142  K 2.3*  --   < > 3.8 4.3 4.7  CL 100*  --   --   --   --   --   CO2 30  --   --   --   --   --   GLUCOSE 115*  --   --   --   --    --   BUN 10  --   < > CREATININE 0.83  --   < > 0.6 0.7 1.0  CALCIUM 9.6  --   --   --   --   --   MG  --  2.2  --   --   --   --   < > = values in this interval not displayed.  Recent Labs  02/05/16 02/15/16 04/29/16  AST ALT ALKPHOS 58 60 105    Recent Labs  01/26/16 1935  05/03/16 05/09/16 05/30/16  WBC 8.1  < > 8.4 9.5 11.8  NEUTROABS 5.7  --   --   --   --   HGB 11.8*  < > 11.8* 13.6 13.9  HCT 34.6*  < > 36 42 42  MCV 96.4  --   --   --   --   PLT 286  < > 345 469* 190  < > = values in this interval not displayed. No results for input(s): CHOL, LDLCALC, TRIG in the last 8760 hours.  Invalid input(s): HCL No results found for: MICROALBUR Lab Results  Component Value Date   TSH 70.27 (A) 06/19/2016   No results found for: HGBA1C No results found for: CHOL, HDL, LDLCALC, LDLDIRECT, TRIG, CHOLHDL  Significant Diagnostic Results in last 30 days:  No results found.  Assessment and Plan  Hypertension Controlled; cont diltiazem 120 mg daily  COPD (chronic obstructive pulmonary disease) (HCC) Pt had exacerbation with PNA in 04/2016 but no problems since; cont theodur 300 mg q 12 amd prn albuterol and atrovent nebs  GERD (gastroesophageal reflux disease) No reported problems; plan to cont protonix 40 mg daily     Marland Reine D. [name], MD

## 2016-06-19 LAB — TSH: TSH: 70.27 u[IU]/mL — AB (ref 0.41–5.90)

## 2016-06-20 ENCOUNTER — Encounter: Payer: Self-pay | Admitting: Internal Medicine

## 2016-06-20 ENCOUNTER — Non-Acute Institutional Stay (SKILLED_NURSING_FACILITY): Payer: Medicare Other | Admitting: Internal Medicine

## 2016-06-20 DIAGNOSIS — E034 Atrophy of thyroid (acquired): Secondary | ICD-10-CM | POA: Diagnosis not present

## 2016-06-20 NOTE — Progress Notes (Signed)
[facility] Room Number: [phone] Place of Service:  SNF [phone])  Randon Goldsmithnne D. Lyn HollingsheadAlexander, MD  Patient Care Team: Shellia CleverlyBeane, Lori M, [place] as PCP - [facility](General Practice)  Extended Emergency Contact Information Primary Emergency Contact: Docia BarrierHopkins,Tammy          [name] [phone] [place] Home Phone: [phone] Relation: Niece Secondary Emergency Contact: [name],Donna  [place] Home Phone: [phone] Relation: Niece    Allergies: Azithromycin; Cephalexin; Cephalosporins; Ciprofloxacin; Darvon [propoxyphene]; Flagyl [metronidazole]; Librax [chlordiazepoxide-clidinium]; Macrodantin [nitrofurantoin macrocrystal]; Nitrofuran derivatives; Other; Oxycodone-acetaminophen; Percocet [oxycodone-acetaminophen]; Quetiapine; Sulfa antibiotics; and Sulfamethoxazole  Chief Complaint  Patient presents with  . Acute Visit    Acute    HPI: Patient is 76 y.o. female who is being seen acutely for a routine lab that returned. TSH is 70.27. Pt denies constipation, dry skin excessive fatigue.  Past Medical History:  Diagnosis Date  . Anemia   . Anxiety   . Arthritis   . Asthma   . Barrett's esophagus   . Chronic kidney disease   . COPD (chronic obstructive pulmonary disease) (HCC)   . Dementia   . Depression   . Disease of thyroid gland   . Diverticulitis   . GERD (gastroesophageal reflux disease)   . Hyperlipemia   . Hypertension   . Memory difficulties   . Osteoporosis   . Pancreatitis   . Psoriasis (a type of skin inflammation)   . Vertigo     Past Surgical History:  Procedure Laterality Date  . BACK SURGERY    . brain shun    . CHOLECYSTECTOMY    . ROTATOR CUFF REPAIR    . SHOULDER SURGERY    . UPPER GI ENDOSCOPY  12/19/2015   [facility] End[facility], Include esophagus, stomach & duodenum or / Jejunum: Dx w/wo biopsys and dilatation; Surgeon : Leonia ReevesAlbert John Rhoton MD    Allergies as of 06/20/2016      Reactions   Azithromycin  Other (See Comments)   unknown   Cephalexin Other (See Comments)   unknown   Cephalosporins    Ciprofloxacin Other (See Comments)   unknown   Darvon [propoxyphene]    Flagyl [metronidazole] Other (See Comments)   unknown   Librax [chlordiazepoxide-clidinium] Other (See Comments)   unknown   Macrodantin [nitrofurantoin Macrocrystal]    Nitrofuran Derivatives Other (See Comments)   unknown   Other Other (See Comments)   unknown   Oxycodone-acetaminophen Other (See Comments)   unknown   Percocet [oxycodone-acetaminophen]    Quetiapine Other (See Comments)   unknown Potassium level dropped.   Sulfa Antibiotics    Sulfamethoxazole Other (See Comments)   unknown      Medication List       Accurate as of 06/20/16  3:08 PM. Always use your most recent med list.          acetaminophen 325 MG tablet Commonly known as:  TYLENOL Take 650 mg by mouth every 8 (eight) hours as needed.   amiodarone 200 MG tablet Commonly known as:  PACERONE Take 200 mg by mouth 2 (two) times daily.   CEREFOLIN PO Take 1 capsule by mouth daily.   clonazePAM 0.5 MG tablet Commonly known as:  KLONOPIN Take 0.5 mg by mouth 2 (two) times daily as needed.   clonazePAM 1 MG tablet Commonly known as:  KLONOPIN Take 2 mg by mouth at bedtime. 2 tablets   D3-1000 1000 units tablet Generic drug:  Cholecalciferol Take  1,000 Units by mouth daily.   diltiazem 120 MG 24 hr capsule Commonly known as:  TIAZAC Take 120 mg by mouth daily.   divalproex 250 MG DR tablet Commonly known as:  DEPAKOTE Take 250 mg by mouth 2 (two) times daily.   donepezil 10 MG tablet Commonly known as:  ARICEPT Take 10 mg by mouth at bedtime.   ipratropium-albuterol 0.5-2.5 (3) MG/3ML Soln Commonly known as:  DUONEB Take 3 mLs by nebulization every 6 (six) hours as needed.   ipratropium-albuterol 0.5-2.5 (3) MG/3ML Soln Commonly known as:  DUONEB Take 3 mLs by nebulization 2 (two) times daily.   KETOCONAZOLE  (BULK) Powd Apply 1 application topically 2 times daily as needed   levothyroxine 25 MCG tablet Commonly known as:  SYNTHROID, LEVOTHROID Take 25 mcg by mouth daily before breakfast.   OLANZapine 10 MG tablet Commonly known as:  ZYPREXA Take 10 mg by mouth at bedtime.   pantoprazole 40 MG tablet Commonly known as:  PROTONIX Take 40 mg by mouth 2 (two) times daily.   potassium chloride 20 MEQ packet Commonly known as:  KLOR-CON Take 40 mEq by mouth 2 (two) times daily.   ranitidine 75 MG tablet Commonly known as:  ZANTAC Take 75 mg by mouth at bedtime.   theophylline 300 MG 12 hr tablet Commonly known as:  THEODUR Take 300 mg by mouth every 12 (twelve) hours.   venlafaxine 37.5 MG tablet Commonly known as:  EFFEXOR Take 37.5 mg by mouth daily.       Meds ordered this encounter  Medications  . levothyroxine (SYNTHROID, LEVOTHROID) 25 MCG tablet    Sig: Take 25 mcg by mouth daily before breakfast.    Immunization History  Administered Date(s) Administered  . Influenza, High Dose Seasonal PF 12/20/2014  . Influenza, Seasonal, Injecte, Preservative Fre 10/10/2014  . Influenza-Unspecified 12/07/2015  . PPD Test 02/10/2016, 02/26/2016  . Pneumococcal Conjugate-13 12/20/2014  . Td 08/26/2014    Social History  Substance Use Topics  . Smoking status: Former Smoker    Quit date: 12/17/1990  . Smokeless tobacco: Never Used  . Alcohol use No    Review of Systems  DATA OBTAINED: from patient, nurse GENERAL:  no fevers, fatigue, appetite changes SKIN: No itching, rash HEENT: No complaint RESPIRATORY: No cough, wheezing, SOB CARDIAC: No chest pain, palpitations, lower extremity edema  GI: No abdominal pain, No N/V/D or constipation, No heartburn or reflux  GU: No dysuria, frequency or urgency, or incontinence  MUSCULOSKELETAL: No unrelieved bone/joint pain NEUROLOGIC: No headache, dizziness  PSYCHIATRIC: No overt anxiety or sadness  Vitals:   06/20/16 1428    BP: (!) 96/56  Pulse: 78  Resp: 15  Temp: 97.9 F (36.6 C)   Body mass index is 27.19 kg/m. Physical Exam  GENERAL APPEARANCE: Alert, conversant, No acute distress  SKIN: No diaphoresis rash HEENT: Unremarkable RESPIRATORY: Breathing is even, unlabored. Lung sounds are clear   CARDIOVASCULAR: Heart RRR no murmurs, rubs or gallops. No peripheral edema  GASTROINTESTINAL: Abdomen is soft, non-tender, not distended w/ normal bowel sounds.  GENITOURINARY: Bladder non tender, not distended  MUSCULOSKELETAL: No abnormal joints or musculature NEUROLOGIC: Cranial nerves 2-12 grossly intact. Moves all extremities PSYCHIATRIC: Mood and affect appropriate with dementia, no behavioral issues  Patient Active Problem List   Diagnosis Date Noted  . PNA (pneumonia) 05/10/2016  . COPD exacerbation (HCC) 05/10/2016  . UTI (urinary tract infection) 05/10/2016  . Paroxysmal atrial fibrillation with RVR (HCC) 05/10/2016  . Metabolic  encephalopathy 05/10/2016  . Elevated amylase and lipase 02/13/2016  . Acute encephalopathy 02/13/2016  . Psychosis 02/13/2016  . Dementia with behavioral disturbance 02/13/2016  . Overactive bladder 02/13/2016  . Chronic kidney disease   . COPD (chronic obstructive pulmonary disease) (HCC)   . Anemia   . Anxiety   . Barrett's esophagus   . Hypothyroidism   . GERD (gastroesophageal reflux disease)   . Hypertension   . Memory difficulties   . Osteoporosis   . Psoriasis (a type of skin inflammation)     CMP     Component Value Date/Time   NA 142 05/09/2016   K 4.7 05/09/2016   CL 100 (L) 01/26/2016 1935   CO2 30 01/26/2016 1935   GLUCOSE 115 (H) 01/26/2016 1935   BUN 9 05/09/2016   CREATININE 1.0 05/09/2016   CREATININE 0.83 01/26/2016 1935   CALCIUM 9.6 01/26/2016 1935   AST 24 04/29/2016   ALT 15 04/29/2016   ALKPHOS 105 04/29/2016   GFRNONAA >60 01/26/2016 1935   GFRAA >60 01/26/2016 1935    Recent Labs  01/26/16 1935 01/26/16 1940   05/02/16 05/03/16 05/09/16  NA 139  --   < > 139 139 142  K 2.3*  --   < > 3.8 4.3 4.7  CL 100*  --   --   --   --   --   CO2 30  --   --   --   --   --   GLUCOSE 115*  --   --   --   --   --   BUN 10  --   < > 6 5 9   CREATININE 0.83  --   < > 0.6 0.7 1.0  CALCIUM 9.6  --   --   --   --   --   MG  --  2.2  --   --   --   --   < > = values in this interval not displayed.  Recent Labs  02/05/16 02/15/16 04/29/16  AST 29 30 24   ALT 21 20 15   ALKPHOS 58 60 105    Recent Labs  01/26/16 1935  05/03/16 05/09/16 05/30/16  WBC 8.1  < > 8.4 9.5 11.8  NEUTROABS 5.7  --   --   --   --   HGB 11.8*  < > 11.8* 13.6 13.9  HCT 34.6*  < > 36 42 42  MCV 96.4  --   --   --   --   PLT 286  < > 345 469* 190  < > = values in this interval not displayed. No results for input(s): CHOL, LDLCALC, TRIG in the last 8760 hours.  Invalid input(s): HCL No results found for: MICROALBUR Lab Results  Component Value Date   TSH 70.27 (A) 06/19/2016   No results found for: HGBA1C No results found for: CHOL, HDL, LDLCALC, LDLDIRECT, TRIG, CHOLHDL  Significant Diagnostic Results in last 30 days:  No results found.  Assessment and Plan  HYPOTHYROIDISM - TSH - 70; have started synthroid 50 mcg, she will need more but don't want to start a big dose at her age. Treatment may impact positively on her MS.     Randon Goldsmith. [name], MD

## 2016-06-30 ENCOUNTER — Encounter: Payer: Self-pay | Admitting: Internal Medicine

## 2016-06-30 NOTE — Progress Notes (Signed)
Location:  [facility]   Place of Service:  SNF (31)  [name] MD  Patient Care Team: Shellia Cleverly, [place] as PCP - General [facility])  Extended Emergency Contact Information Primary Emergency Contact: [name] [phone] [name] of [place] Home Phone: [phone] Relation: Niece Secondary Emergency Contact: Dorice Lamas [place] of [place] Home Phone: [phone] Relation: Niece    Allergies: Azithromycin; Cephalexin; Cephalosporins; Ciprofloxacin; Darvon [propoxyphene]; Flagyl [metronidazole]; Librax [chlordiazepoxide-clidinium]; Macrodantin [nitrofurantoin macrocrystal]; Nitrofuran derivatives; Other; Oxycodone-acetaminophen; Percocet [oxycodone-acetaminophen]; Quetiapine; Sulfa antibiotics; and Sulfamethoxazole  Chief Complaint  Patient presents with  . Acute Visit    HPI: Patient is 76 y.o. female who is being seen acutely for return of results for w/u of confusion initiated per phone conversation. Pt's confusion is improved and w/u was neg except for TSH. Pt has no c/o, nor do nurses.  Past Medical History:  Diagnosis Date  . Anemia   . Anxiety   . Arthritis   . Asthma   . Barrett's esophagus   . Chronic kidney disease   . COPD (chronic obstructive pulmonary disease) (HCC)   . Dementia   . Depression   . Disease of thyroid gland   . Diverticulitis   . GERD (gastroesophageal reflux disease)   . Hyperlipemia   . Hypertension   . Memory difficulties   . Osteoporosis   . Pancreatitis   . Psoriasis (a type of skin inflammation)   . Vertigo     Past Surgical History:  Procedure Laterality Date  . BACK SURGERY    . brain shun    . CHOLECYSTECTOMY    . ROTATOR CUFF REPAIR    . SHOULDER SURGERY    . UPPER GI ENDOSCOPY  12/19/2015   [facility] End[facility], Include esophagus, stomach & duodenum or / Jejunum: Dx w/wo biopsys and dilatation; Surgeon : Leonia Reeves Rhoton MD    Allergies as of 05/29/2016       Reactions   Azithromycin Other (See Comments)   unknown   Cephalexin Other (See Comments)   unknown   Cephalosporins    Ciprofloxacin Other (See Comments)   unknown   Darvon [propoxyphene]    Flagyl [metronidazole] Other (See Comments)   unknown   Librax [chlordiazepoxide-clidinium] Other (See Comments)   unknown   Macrodantin [nitrofurantoin Macrocrystal]    Nitrofuran Derivatives Other (See Comments)   unknown   Other Other (See Comments)   unknown   Oxycodone-acetaminophen Other (See Comments)   unknown   Percocet [oxycodone-acetaminophen]    Quetiapine Other (See Comments)   unknown Potassium level dropped.   Sulfa Antibiotics    Sulfamethoxazole Other (See Comments)   unknown      Medication List       Accurate as of 05/29/16 11:59 PM. Always use your most recent med list.          acetaminophen 325 MG tablet Commonly known as:  TYLENOL Take 650 mg by mouth every 8 (eight) hours as needed.   amiodarone 200 MG tablet Commonly known as:  PACERONE Take 200 mg by mouth 2 (two) times daily.   CEREFOLIN PO Take 1 capsule by mouth daily.   clonazePAM 0.5 MG tablet Commonly known as:  KLONOPIN Take 0.5 mg by mouth 2 (two) times daily as needed.   clonazePAM 1 MG tablet Commonly known as:  KLONOPIN Take 2 mg by mouth at bedtime. 2 tablets   D3-1000 1000  units tablet Generic drug:  Cholecalciferol Take 1,000 Units by mouth daily.   diltiazem 120 MG 24 hr capsule Commonly known as:  TIAZAC Take 120 mg by mouth daily.   divalproex 250 MG DR tablet Commonly known as:  DEPAKOTE Take 250 mg by mouth 2 (two) times daily.   donepezil 10 MG tablet Commonly known as:  ARICEPT Take 10 mg by mouth at bedtime.   ipratropium-albuterol 0.5-2.5 (3) MG/3ML Soln Commonly known as:  DUONEB Take 3 mLs by nebulization every 6 (six) hours as needed.   KETOCONAZOLE (BULK) Powd Apply 1 application topically 2 times daily as needed   OLANZapine 10 MG  tablet Commonly known as:  ZYPREXA Take 10 mg by mouth at bedtime.   pantoprazole 40 MG tablet Commonly known as:  PROTONIX Take 40 mg by mouth 2 (two) times daily.   potassium chloride 20 MEQ packet Commonly known as:  KLOR-CON Take 40 mEq by mouth 2 (two) times daily.   ranitidine 75 MG tablet Commonly known as:  ZANTAC Take 75 mg by mouth at bedtime.   theophylline 300 MG 12 hr tablet Commonly known as:  THEODUR Take 300 mg by mouth every 12 (twelve) hours.   venlafaxine 37.5 MG tablet Commonly known as:  EFFEXOR Take 37.5 mg by mouth daily.       No orders of the defined types were placed in this encounter.   Immunization History  Administered Date(s) Administered  . Influenza, High Dose Seasonal PF 12/20/2014  . Influenza, Seasonal, Injecte, Preservative Fre 10/10/2014  . Influenza-Unspecified 12/07/2015  . PPD Test 02/10/2016, 02/26/2016  . Pneumococcal Conjugate-13 12/20/2014  . Td 08/26/2014    Social History  Substance Use Topics  . Smoking status: Former Smoker    Quit date: 12/17/1990  . Smokeless tobacco: Never Used  . Alcohol use No    Review of Systems  DATA OBTAINED: from patient, nurse GENERAL:  no fevers, fatigue, appetite changes SKIN: No itching, rash HEENT: No complaint RESPIRATORY: No cough, wheezing, SOB CARDIAC: No chest pain, palpitations, lower extremity edema  GI: No abdominal pain, No N/V/D or constipation, No heartburn or reflux  GU: No dysuria, frequency or urgency, or incontinence  MUSCULOSKELETAL: No unrelieved bone/joint pain NEUROLOGIC: No headache, dizziness  PSYCHIATRIC: No overt anxiety or sadness  Vitals:   06/30/16 1430  BP: 114/78  Pulse: 64  Resp: 20  Temp: 97.8 F (36.6 C)   There is no height or weight on file to calculate BMI. Physical Exam  GENERAL APPEARANCE: Alert, conversant, No acute distress  SKIN: No diaphoresis rash HEENT: Unremarkable RESPIRATORY: Breathing is even, unlabored. Lung sounds  are clear   CARDIOVASCULAR: Heart RRR no murmurs, rubs or gallops. No peripheral edema  GASTROINTESTINAL: Abdomen is soft, non-tender, not distended w/ normal bowel sounds.  GENITOURINARY: Bladder non tender, not distended  MUSCULOSKELETAL: No abnormal joints or musculature NEUROLOGIC: Cranial nerves 2-12 grossly intact. Moves all extremities PSYCHIATRIC: Mood and affect with some confusion,improved no behavioral issues  Patient Active Problem List   Diagnosis Date Noted  . PNA (pneumonia) 05/10/2016  . COPD exacerbation (HCC) 05/10/2016  . UTI (urinary tract infection) 05/10/2016  . Paroxysmal atrial fibrillation with RVR (HCC) 05/10/2016  . Metabolic encephalopathy 05/10/2016  . Elevated amylase and lipase 02/13/2016  . Acute encephalopathy 02/13/2016  . Psychosis 02/13/2016  . Dementia with behavioral disturbance 02/13/2016  . Overactive bladder 02/13/2016  . Chronic kidney disease   . COPD (chronic obstructive pulmonary disease) (HCC)   .  Anemia   . Anxiety   . Barrett's esophagus   . Hypothyroidism   . GERD (gastroesophageal reflux disease)   . Hypertension   . Memory difficulties   . Osteoporosis   . Psoriasis (a type of skin inflammation)     CMP     Component Value Date/Time   NA 142 05/09/2016   K 4.7 05/09/2016   CL 100 (L) 01/26/2016 1935   CO2 30 01/26/2016 1935   GLUCOSE 115 (H) 01/26/2016 1935   BUN 9 05/09/2016   CREATININE 1.0 05/09/2016   CREATININE 0.83 01/26/2016 1935   CALCIUM 9.6 01/26/2016 1935   AST 24 04/29/2016   ALT 15 04/29/2016   ALKPHOS 105 04/29/2016   GFRNONAA >60 01/26/2016 1935   GFRAA >60 01/26/2016 1935    Recent Labs  01/26/16 1935 01/26/16 1940  05/02/16 05/03/16 05/09/16  NA 139  --   < > 139 139 142  K 2.3*  --   < > 3.8 4.3 4.7  CL 100*  --   --   --   --   --   CO2 30  --   --   --   --   --   GLUCOSE 115*  --   --   --   --   --   BUN 10  --   < > 6 5 9   CREATININE 0.83  --   < > 0.6 0.7 1.0  CALCIUM 9.6  --   --    --   --   --   MG  --  2.2  --   --   --   --   < > = values in this interval not displayed.  Recent Labs  02/05/16 02/15/16 04/29/16  AST 29 30 24   ALT 21 20 15   ALKPHOS 58 60 105    Recent Labs  01/26/16 1935  05/03/16 05/09/16 05/30/16  WBC 8.1  < > 8.4 9.5 11.8  NEUTROABS 5.7  --   --   --   --   HGB 11.8*  < > 11.8* 13.6 13.9  HCT 34.6*  < > 36 42 42  MCV 96.4  --   --   --   --   PLT 286  < > 345 469* 190  < > = values in this interval not displayed. No results for input(s): CHOL, LDLCALC, TRIG in the last 8760 hours.  Invalid input(s): HCL No results found for: MICROALBUR Lab Results  Component Value Date   TSH 70.27 (A) 06/19/2016   No results found for: HGBA1C No results found for: CHOL, HDL, LDLCALC, LDLDIRECT, TRIG, CHOLHDL  Significant Diagnostic Results in last 30 days:  No results found.  Assessment and Plan  CONFUSION/HYPOTHYROIDISM - pt 's w/u was neg ( labs, CXR, urine ) except for her TSH which was 49. Apparently pt was sent back form the hospital without her synthroid-she had been on 112 mcg daily when she went. Have started her on 25 mcg daily with repeat in several weeks     Merrilee SeashoreAnne Kahleb Mcclane, MD

## 2016-07-02 ENCOUNTER — Non-Acute Institutional Stay (SKILLED_NURSING_FACILITY): Payer: Medicare Other | Admitting: Internal Medicine

## 2016-07-02 ENCOUNTER — Encounter: Payer: Self-pay | Admitting: Internal Medicine

## 2016-07-02 DIAGNOSIS — F29 Unspecified psychosis not due to a substance or known physiological condition: Secondary | ICD-10-CM | POA: Diagnosis not present

## 2016-07-02 DIAGNOSIS — F0281 Dementia in other diseases classified elsewhere with behavioral disturbance: Secondary | ICD-10-CM | POA: Diagnosis not present

## 2016-07-02 DIAGNOSIS — F39 Unspecified mood [affective] disorder: Secondary | ICD-10-CM

## 2016-07-02 DIAGNOSIS — G301 Alzheimer's disease with late onset: Secondary | ICD-10-CM | POA: Diagnosis not present

## 2016-07-02 DIAGNOSIS — F02818 Dementia in other diseases classified elsewhere, unspecified severity, with other behavioral disturbance: Secondary | ICD-10-CM

## 2016-07-02 NOTE — Progress Notes (Signed)
Location:  [facility] Room Number: [phone] Place of Service:  SNF [phone])  Randon Goldsmith. [name], MD  Patient Care Team: Shellia Cleverly, [place] as PCP - General [facility])  Extended Emergency Contact Information Primary Emergency Contact: [name] [phone] [name] of [place] Home Phone: [phone] Relation: Niece Secondary Emergency Contact: Dorice Lamas [place] of [place] Home Phone: [phone] Relation: Niece    Allergies: Azithromycin; Cephalexin; Cephalosporins; Ciprofloxacin; Darvon [propoxyphene]; Flagyl [metronidazole]; Librax [chlordiazepoxide-clidinium]; Macrodantin [nitrofurantoin macrocrystal]; Nitrofuran derivatives; Other; Oxycodone-acetaminophen; Percocet [oxycodone-acetaminophen]; Quetiapine; Sulfa antibiotics; and Sulfamethoxazole  Chief Complaint  Patient presents with  . Medical Management of Chronic Issues    Routine Visit    HPI: Patient is 76 y.o. female who Is being seen for routine issues of dementia, psychosis, and mood disorder.  Past Medical History:  Diagnosis Date  . Anemia   . Anxiety   . Arthritis   . Asthma   . Barrett's esophagus   . Chronic kidney disease   . COPD (chronic obstructive pulmonary disease) (HCC)   . Dementia   . Depression   . Disease of thyroid gland   . Diverticulitis   . GERD (gastroesophageal reflux disease)   . Hyperlipemia   . Hypertension   . Memory difficulties   . Osteoporosis   . Pancreatitis   . Psoriasis (a type of skin inflammation)   . Vertigo     Past Surgical History:  Procedure Laterality Date  . BACK SURGERY    . brain shun    . CHOLECYSTECTOMY    . ROTATOR CUFF REPAIR    . SHOULDER SURGERY    . UPPER GI ENDOSCOPY  12/19/2015   [facility] End[facility], Include esophagus, stomach & duodenum or / Jejunum: Dx w/wo biopsys and dilatation; Surgeon : Leonia Reeves Rhoton MD    Allergies as of 07/02/2016      Reactions   Azithromycin Other (See  Comments)   unknown   Cephalexin Other (See Comments)   unknown   Cephalosporins    Ciprofloxacin Other (See Comments)   unknown   Darvon [propoxyphene]    Flagyl [metronidazole] Other (See Comments)   unknown   Librax [chlordiazepoxide-clidinium] Other (See Comments)   unknown   Macrodantin [nitrofurantoin Macrocrystal]    Nitrofuran Derivatives Other (See Comments)   unknown   Other Other (See Comments)   unknown   Oxycodone-acetaminophen Other (See Comments)   unknown   Percocet [oxycodone-acetaminophen]    Quetiapine Other (See Comments)   unknown Potassium level dropped.   Sulfa Antibiotics    Sulfamethoxazole Other (See Comments)   unknown      Medication List       Accurate as of 07/02/16 11:59 PM. Always use your most recent med list.          acetaminophen 325 MG tablet Commonly known as:  TYLENOL Take 650 mg by mouth every 8 (eight) hours as needed.   albuterol 108 (90 Base) MCG/ACT inhaler Commonly known as:  PROVENTIL HFA;VENTOLIN HFA Inhale 2 puffs into the lungs every 6 (six) hours as needed for wheezing.   amiodarone 200 MG tablet Commonly known as:  PACERONE Take 200 mg by mouth 2 (two) times daily.   clonazePAM 0.5 MG tablet Commonly known as:  KLONOPIN Take 0.5 mg by mouth 2 (two) times daily as needed.   clonazePAM 1 MG tablet Commonly known as:  KLONOPIN Take 2 mg by mouth at  bedtime. 2 tablets   D3-1000 1000 units tablet Generic drug:  Cholecalciferol Take 1,000 Units by mouth daily.   diltiazem 120 MG 24 hr capsule Commonly known as:  TIAZAC Take 120 mg by mouth daily.   divalproex 250 MG DR tablet Commonly known as:  DEPAKOTE Take 250 mg by mouth 2 (two) times daily.   donepezil 10 MG tablet Commonly known as:  ARICEPT Take 10 mg by mouth at bedtime.   ipratropium-albuterol 0.5-2.5 (3) MG/3ML Soln Commonly known as:  DUONEB Take 3 mLs by nebulization every 6 (six) hours as needed.   ipratropium-albuterol 0.5-2.5 (3)  MG/3ML Soln Commonly known as:  DUONEB Take 3 mLs by nebulization 2 (two) times daily.   KETOCONAZOLE (BULK) Powd Apply 1 application topically 2 times daily as needed   levothyroxine 50 MCG tablet Commonly known as:  SYNTHROID, LEVOTHROID Take 50 mcg by mouth daily before breakfast.   OLANZapine 10 MG tablet Commonly known as:  ZYPREXA Take 10 mg by mouth at bedtime.   pantoprazole 40 MG tablet Commonly known as:  PROTONIX Take 40 mg by mouth 2 (two) times daily.   potassium chloride 20 MEQ packet Commonly known as:  KLOR-CON Take 40 mEq by mouth 2 (two) times daily.   ranitidine 75 MG tablet Commonly known as:  ZANTAC Take 75 mg by mouth at bedtime.   theophylline 300 MG 12 hr tablet Commonly known as:  THEODUR Take 300 mg by mouth every 12 (twelve) hours.   venlafaxine 37.5 MG tablet Commonly known as:  EFFEXOR Take 37.5 mg by mouth daily.       Meds ordered this encounter  Medications  . DISCONTD: levothyroxine (SYNTHROID, LEVOTHROID) 50 MCG tablet    Sig: Take 50 mcg by mouth daily before breakfast.  . albuterol (PROVENTIL HFA;VENTOLIN HFA) 108 (90 Base) MCG/ACT inhaler    Sig: Inhale 2 puffs into the lungs every 6 (six) hours as needed for wheezing.    Immunization History  Administered Date(s) Administered  . Influenza, High Dose Seasonal PF 12/20/2014  . Influenza, Seasonal, Injecte, Preservative Fre 10/10/2014  . Influenza-Unspecified 12/07/2015  . PPD Test 02/10/2016, 02/26/2016  . Pneumococcal Conjugate-13 12/20/2014  . Td 08/26/2014    Social History  Substance Use Topics  . Smoking status: Former Smoker    Quit date: 12/17/1990  . Smokeless tobacco: Never Used  . Alcohol use No    Review of Systems  DATA OBTAINED: from patient, nurse GENERAL:  no fevers, fatigue, appetite changes SKIN: No itching, rash HEENT: No complaint RESPIRATORY: No cough, wheezing, SOB CARDIAC: No chest pain, palpitations, lower extremity edema  GI: No  abdominal pain, No N/V/D or constipation, No heartburn or reflux  GU: No dysuria, frequency or urgency, or incontinence  MUSCULOSKELETAL: No unrelieved bone/joint pain NEUROLOGIC: No headache, dizziness  PSYCHIATRIC: No overt anxiety or sadness  Vitals:   07/02/16 1012  BP: 106/72  Pulse: 90  Resp: 20  Temp: 97.1 F (36.2 C)   Body mass index is 27.36 kg/m. Physical Exam  GENERAL APPEARANCE: Alert, conversant, No acute distress  SKIN: No diaphoresis rash HEENT: Unremarkable RESPIRATORY: Breathing is even, unlabored. Lung sounds are clear   CARDIOVASCULAR: Heart RRR no murmurs, rubs or gallops. No peripheral edema  GASTROINTESTINAL: Abdomen is soft, non-tender, not distended w/ normal bowel sounds.  GENITOURINARY: Bladder non tender, not distended  MUSCULOSKELETAL: No abnormal joints or musculature NEUROLOGIC: Cranial nerves 2-12 grossly intact. Moves all extremities PSYCHIATRIC: Mood and affect with dementia, no behavioral  issues  Patient Active Problem List   Diagnosis Date Noted  . Mood disorder (HCC) 08/22/2016  . PNA (pneumonia) 05/10/2016  . COPD exacerbation (HCC) 05/10/2016  . UTI (urinary tract infection) 05/10/2016  . Paroxysmal atrial fibrillation with RVR (HCC) 05/10/2016  . Metabolic encephalopathy 05/10/2016  . Elevated amylase and lipase 02/13/2016  . Acute encephalopathy 02/13/2016  . Psychosis 02/13/2016  . Dementia with behavioral disturbance 02/13/2016  . Overactive bladder 02/13/2016  . Chronic kidney disease   . COPD (chronic obstructive pulmonary disease) (HCC)   . Anemia   . Anxiety   . Barrett's esophagus   . Hypothyroidism   . GERD (gastroesophageal reflux disease)   . Hypertension   . Memory difficulties   . Osteoporosis   . Psoriasis (a type of skin inflammation)     CMP     Component Value Date/Time   NA 140 05/24/2016   K 4.3 05/24/2016   CL 100 (L) 01/26/2016 1935   CO2 30 01/26/2016 1935   GLUCOSE 115 (H) 01/26/2016 1935    BUN 17 05/24/2016   CREATININE 0.8 05/24/2016   CREATININE 0.83 01/26/2016 1935   CALCIUM 9.6 01/26/2016 1935   AST 23 05/24/2016   ALT 12 05/24/2016   ALKPHOS 84 05/24/2016   GFRNONAA >60 01/26/2016 1935   GFRAA >60 01/26/2016 1935    Recent Labs  01/26/16 1935 01/26/16 1940  05/03/16 05/09/16 05/24/16  NA 139  --   < > 139 142 140  K 2.3*  --   < > 4.3 4.7 4.3  CL 100*  --   --   --   --   --   CO2 30  --   --   --   --   --   GLUCOSE 115*  --   --   --   --   --   BUN 10  --   < > 5 9 17   CREATININE 0.83  --   < > 0.7 1.0 0.8  CALCIUM 9.6  --   --   --   --   --   MG  --  2.2  --   --   --   --   < > = values in this interval not displayed.  Recent Labs  02/15/16 04/29/16 05/24/16  AST 30 24 23   ALT 20 15 12   ALKPHOS 60 105 84    Recent Labs  01/26/16 1935  05/09/16 05/24/16 05/30/16  WBC 8.1  < > 9.5 13.2 11.8  NEUTROABS 5.7  --   --   --   --   HGB 11.8*  < > 13.6 14.2 13.9  HCT 34.6*  < > 42 43 42  MCV 96.4  --   --   --   --   PLT 286  < > 469* 201 190  < > = values in this interval not displayed. No results for input(s): CHOL, LDLCALC, TRIG in the last 8760 hours.  Invalid input(s): HCL No results found for: MICROALBUR Lab Results  Component Value Date   TSH 51.55 (A) 07/22/2016   No results found for: HGBA1C No results found for: CHOL, HDL, LDLCALC, LDLDIRECT, TRIG, CHOLHDL  Significant Diagnostic Results in last 30 days:  No results found.  Assessment and Plan  Dementia with behavioral disturbance Chronic and stable; continue Aricept 10 mg by mouth daily  Psychosis Appears well controlled; continue Depakote 250 mg twice a day and Zyprexa 10 mg by mouth  daily at bedtime  Mood disorder (HCC) Mood appears stable, patient appears calm; continue Effexor 37.5 mg by mouth daily     Jaspreet Bodner D. Lyn HollingsheadAlexander, MD

## 2016-07-07 ENCOUNTER — Encounter: Payer: Self-pay | Admitting: Internal Medicine

## 2016-07-07 NOTE — Assessment & Plan Note (Signed)
No reported problems; plan to cont protonix 40 mg daily

## 2016-07-07 NOTE — Assessment & Plan Note (Signed)
Controlled; cont diltiazem 120 mg daily

## 2016-07-07 NOTE — Assessment & Plan Note (Signed)
Pt had exacerbation with PNA in 04/2016 but no problems since; cont theodur 300 mg q 12 amd prn albuterol and atrovent nebs

## 2016-07-09 ENCOUNTER — Non-Acute Institutional Stay (SKILLED_NURSING_FACILITY): Payer: Medicare Other | Admitting: Internal Medicine

## 2016-07-09 DIAGNOSIS — R350 Frequency of micturition: Secondary | ICD-10-CM

## 2016-07-10 ENCOUNTER — Encounter: Payer: Self-pay | Admitting: Internal Medicine

## 2016-07-10 NOTE — Progress Notes (Signed)
[facility] Room Number: [phone] Place of Service:  SNF [phone]  Randon Goldsmithnne D. Lyn HollingsheadAlexander, MD  Patient Care Team: Shellia CleverlyBeane, Lori M, [place] as PCP - [facility](General Practice)  Extended Emergency Contact Information Primary Emergency Contact: Docia BarrierHopkins,Tammy          [name] [phone] [place] Home Phone: [phone] Relation: Niece Secondary Emergency Contact: [name],Donna  [place] Home Phone: [phone] Relation: Niece    Allergies: Azithromycin; Cephalexin; Cephalosporins; Ciprofloxacin; Darvon [propoxyphene]; Flagyl [metronidazole]; Librax [chlordiazepoxide-clidinium]; Macrodantin [nitrofurantoin macrocrystal]; Nitrofuran derivatives; Other; Oxycodone-acetaminophen; Percocet [oxycodone-acetaminophen]; Quetiapine; Sulfa antibiotics; and Sulfamethoxazole  Chief Complaint  Patient presents with  . Acute Visit    Acute    HPI: Patient is 76 y.o. female who Whom nursing has asked me to see for urinary frequency. Patient has had a UA and it was negative. Nursing does note that patient drinks fluids a lot. It was noted that patient was taking oxybutynin in the hospital.  Past Medical History:  Diagnosis Date  . Anemia   . Anxiety   . Arthritis   . Asthma   . Barrett's esophagus   . Chronic kidney disease   . COPD (chronic obstructive pulmonary disease) (HCC)   . Dementia   . Depression   . Disease of thyroid gland   . Diverticulitis   . GERD (gastroesophageal reflux disease)   . Hyperlipemia   . Hypertension   . Memory difficulties   . Osteoporosis   . Pancreatitis   . Psoriasis (a type of skin inflammation)   . Vertigo     Past Surgical History:  Procedure Laterality Date  . BACK SURGERY    . brain shun    . CHOLECYSTECTOMY    . ROTATOR CUFF REPAIR    . SHOULDER SURGERY    . UPPER GI ENDOSCOPY  12/19/2015   [facility] Endo, Include esophagus, stomach & duodenum or / Jejunum: Dx w/wo biopsys and dilatation;  Surgeon : Leonia ReevesAlbert John Rhoton MD    Allergies as of 07/09/2016      Reactions   Azithromycin Other (See Comments)   unknown   Cephalexin Other (See Comments)   unknown   Cephalosporins    Ciprofloxacin Other (See Comments)   unknown   Darvon [propoxyphene]    Flagyl [metronidazole] Other (See Comments)   unknown   Librax [chlordiazepoxide-clidinium] Other (See Comments)   unknown   Macrodantin [nitrofurantoin Macrocrystal]    Nitrofuran Derivatives Other (See Comments)   unknown   Other Other (See Comments)   unknown   Oxycodone-acetaminophen Other (See Comments)   unknown   Percocet [oxycodone-acetaminophen]    Quetiapine Other (See Comments)   unknown Potassium level dropped.   Sulfa Antibiotics    Sulfamethoxazole Other (See Comments)   unknown      Medication List       Accurate as of 07/09/16 11:59 PM. Always use your most recent med list.          acetaminophen 325 MG tablet Commonly known as:  TYLENOL Take 650 mg by mouth every 8 (eight) hours as needed.   albuterol 108 (90 Base) MCG/ACT inhaler Commonly known as:  PROVENTIL HFA;VENTOLIN HFA Inhale 2 puffs into the lungs every 6 (six) hours as needed for wheezing.   amiodarone 200 MG tablet Commonly known as:  PACERONE Take 200 mg by mouth 2 (two) times daily.   clonazePAM 0.5 MG tablet Commonly known as:  KLONOPIN Take 0.5 mg by mouth  2 (two) times daily as needed.   clonazePAM 1 MG tablet Commonly known as:  KLONOPIN Take 2 mg by mouth at bedtime. 2 tablets   D3-1000 1000 units tablet Generic drug:  Cholecalciferol Take 1,000 Units by mouth daily.   diltiazem 120 MG 24 hr capsule Commonly known as:  TIAZAC Take 120 mg by mouth daily.   divalproex 250 MG DR tablet Commonly known as:  DEPAKOTE Take 250 mg by mouth 2 (two) times daily.   donepezil 10 MG tablet Commonly known as:  ARICEPT Take 10 mg by mouth at bedtime.   ipratropium-albuterol 0.5-2.5 (3) MG/3ML Soln Commonly known as:   DUONEB Take 3 mLs by nebulization every 6 (six) hours as needed.   KETOCONAZOLE (BULK) Powd Apply 1 application topically 2 times daily as needed   levothyroxine 50 MCG tablet Commonly known as:  SYNTHROID, LEVOTHROID Take 50 mcg by mouth daily before breakfast.   OLANZapine 10 MG tablet Commonly known as:  ZYPREXA Take 10 mg by mouth at bedtime.   pantoprazole 40 MG tablet Commonly known as:  PROTONIX Take 40 mg by mouth 2 (two) times daily.   potassium chloride 20 MEQ packet Commonly known as:  KLOR-CON Take 40 mEq by mouth 2 (two) times daily.   ranitidine 75 MG tablet Commonly known as:  ZANTAC Take 75 mg by mouth at bedtime.   theophylline 300 MG 12 hr tablet Commonly known as:  THEODUR Take 300 mg by mouth every 12 (twelve) hours.   venlafaxine 37.5 MG tablet Commonly known as:  EFFEXOR Take 37.5 mg by mouth daily.       No orders of the defined types were placed in this encounter.   Immunization History  Administered Date(s) Administered  . Influenza, High Dose Seasonal PF 12/20/2014  . Influenza, Seasonal, Injecte, Preservative Fre 10/10/2014  . Influenza-Unspecified 12/07/2015  . PPD Test 02/10/2016, 02/26/2016  . Pneumococcal Conjugate-13 12/20/2014  . Td 08/26/2014    Social History  Substance Use Topics  . Smoking status: Former Smoker    Quit date: 12/17/1990  . Smokeless tobacco: Never Used  . Alcohol use No    Review of Systems  DATA OBTAINED: from patient, nurse GENERAL:  no fevers, fatigue, appetite changes SKIN: No itching, rash HEENT: No complaint RESPIRATORY: No cough, wheezing, SOB CARDIAC: No chest pain, palpitations, lower extremity edema  GI: No abdominal pain, No N/V/D or constipation, No heartburn or reflux  GU: No dysuria,+ frequency and urgency, no incontinence  MUSCULOSKELETAL: No unrelieved bone/joint pain NEUROLOGIC: No headache, dizziness  PSYCHIATRIC: No overt anxiety or sadness  Vitals:   07/09/16 1352  BP:  106/73  Pulse: 97  Resp: 18  Temp: 97.1 F (36.2 C)   Body mass index is 27.36 kg/m. Physical Exam  GENERAL APPEARANCE: Alert, conversant, No acute distress  SKIN: No diaphoresis rash HEENT: Unremarkable RESPIRATORY: Breathing is even, unlabored. Lung sounds are clear   CARDIOVASCULAR: Heart RRR no murmurs, rubs or gallops. No peripheral edema  GASTROINTESTINAL: Abdomen is soft, non-tender, not distended w/ normal bowel sounds.  GENITOURINARY: Bladder non tender, not distended  MUSCULOSKELETAL: No abnormal joints or musculature NEUROLOGIC: Cranial nerves 2-12 grossly intact. Moves all extremities PSYCHIATRIC: Mood and affect with dementia, no behavioral issues  Patient Active Problem List   Diagnosis Date Noted  . PNA (pneumonia) 05/10/2016  . COPD exacerbation (HCC) 05/10/2016  . UTI (urinary tract infection) 05/10/2016  . Paroxysmal atrial fibrillation with RVR (HCC) 05/10/2016  . Metabolic encephalopathy 05/10/2016  .  Elevated amylase and lipase 02/13/2016  . Acute encephalopathy 02/13/2016  . Psychosis 02/13/2016  . Dementia with behavioral disturbance 02/13/2016  . Overactive bladder 02/13/2016  . Chronic kidney disease   . COPD (chronic obstructive pulmonary disease) (HCC)   . Anemia   . Anxiety   . Barrett's esophagus   . Hypothyroidism   . GERD (gastroesophageal reflux disease)   . Hypertension   . Memory difficulties   . Osteoporosis   . Psoriasis (a type of skin inflammation)     CMP     Component Value Date/Time   NA 142 05/09/2016   K 4.7 05/09/2016   CL 100 (L) 01/26/2016 1935   CO2 30 01/26/2016 1935   GLUCOSE 115 (H) 01/26/2016 1935   BUN 9 05/09/2016   CREATININE 1.0 05/09/2016   CREATININE 0.83 01/26/2016 1935   CALCIUM 9.6 01/26/2016 1935   AST 24 04/29/2016   ALT 15 04/29/2016   ALKPHOS 105 04/29/2016   GFRNONAA >60 01/26/2016 1935   GFRAA >60 01/26/2016 1935    Recent Labs  01/26/16 1935 01/26/16 1940  05/02/16 05/03/16 05/09/16    NA 139  --   < > 139 139 142  K 2.3*  --   < > 3.8 4.3 4.7  CL 100*  --   --   --   --   --   CO2 30  --   --   --   --   --   GLUCOSE 115*  --   --   --   --   --   BUN 10  --   < > 6 5 9   CREATININE 0.83  --   < > 0.6 0.7 1.0  CALCIUM 9.6  --   --   --   --   --   MG  --  2.2  --   --   --   --   < > = values in this interval not displayed.  Recent Labs  02/05/16 02/15/16 04/29/16  AST 29 30 24   ALT 21 20 15   ALKPHOS 58 60 105    Recent Labs  01/26/16 1935  05/03/16 05/09/16 05/30/16  WBC 8.1  < > 8.4 9.5 11.8  NEUTROABS 5.7  --   --   --   --   HGB 11.8*  < > 11.8* 13.6 13.9  HCT 34.6*  < > 36 42 42  MCV 96.4  --   --   --   --   PLT 286  < > 345 469* 190  < > = values in this interval not displayed. No results for input(s): CHOL, LDLCALC, TRIG in the last 8760 hours.  Invalid input(s): HCL No results found for: MICROALBUR Lab Results  Component Value Date   TSH 70.27 (A) 06/19/2016   No results found for: HGBA1C No results found for: CHOL, HDL, LDLCALC, LDLDIRECT, TRIG, CHOLHDL  Significant Diagnostic Results in last 30 days:  No results found.  Assessment and Plan  URINARY FREQUENCY- with negative UAs; probably OAB; will start oxybutynin 5 mg by mouth 3 times a day; will continue to monitor     Kyani Simkin D. [name], MD

## 2016-07-21 ENCOUNTER — Encounter: Payer: Self-pay | Admitting: Internal Medicine

## 2016-07-22 LAB — TSH: TSH: 51.55 — AB (ref 0.41–5.90)

## 2016-07-31 ENCOUNTER — Non-Acute Institutional Stay (SKILLED_NURSING_FACILITY): Payer: Medicare Other

## 2016-07-31 ENCOUNTER — Encounter: Payer: Self-pay | Admitting: Internal Medicine

## 2016-07-31 ENCOUNTER — Non-Acute Institutional Stay (SKILLED_NURSING_FACILITY): Payer: Medicare Other | Admitting: Internal Medicine

## 2016-07-31 DIAGNOSIS — N3281 Overactive bladder: Secondary | ICD-10-CM

## 2016-07-31 DIAGNOSIS — Z0000 Encounter for general adult medical examination without abnormal findings: Secondary | ICD-10-CM

## 2016-07-31 DIAGNOSIS — F419 Anxiety disorder, unspecified: Secondary | ICD-10-CM

## 2016-07-31 DIAGNOSIS — E034 Atrophy of thyroid (acquired): Secondary | ICD-10-CM | POA: Diagnosis not present

## 2016-07-31 NOTE — Progress Notes (Signed)
Subjective:   Rachel Dickerson is a 76 y.o. female who presents for an Initial Medicare Annual Wellness Visit at [facility]       Objective:    Today's Vitals   07/31/16 0924 07/31/16 0925  BP: 110/64   Pulse: 83   Temp: 97.8 F (36.6 C)   TempSrc: Oral   SpO2: 93%   Weight: 159 lb (72.1 kg)   Height: 5\' 4"  (1.626 m)   PainSc:  8    Body mass index is 27.29 kg/m.   Current Medications (verified) Outpatient Encounter Prescriptions as of 07/31/2016  Medication Sig  . acetaminophen (TYLENOL) 325 MG tablet Take 650 mg by mouth every 8 (eight) hours as needed.   Marland Kitchen albuterol (PROVENTIL HFA;VENTOLIN HFA) 108 (90 Base) MCG/ACT inhaler Inhale 2 puffs into the lungs every 6 (six) hours as needed for wheezing.  Marland Kitchen amiodarone (PACERONE) 200 MG tablet Take 200 mg by mouth 2 (two) times daily.  . Cholecalciferol (D3-1000) 1000 units tablet Take 1,000 Units by mouth daily.   . clonazePAM (KLONOPIN) 0.5 MG tablet Take 0.5 mg by mouth 2 (two) times daily as needed.   . clonazePAM (KLONOPIN) 1 MG tablet Take 2 mg by mouth at bedtime. 2 tablets  . diltiazem (TIAZAC) 120 MG 24 hr capsule Take 120 mg by mouth daily.  . divalproex (DEPAKOTE) 250 MG DR tablet Take 250 mg by mouth 2 (two) times daily.   Marland Kitchen donepezil (ARICEPT) 10 MG tablet Take 10 mg by mouth at bedtime.  Marland Kitchen ipratropium-albuterol (DUONEB) 0.5-2.5 (3) MG/3ML SOLN Take 3 mLs by nebulization every 6 (six) hours as needed.   Marland Kitchen KETOCONAZOLE, BULK, POWD Apply 1 application topically 2 times daily as needed  . levothyroxine (SYNTHROID, LEVOTHROID) 100 MCG tablet Take 100 mcg by mouth daily before breakfast.  . OLANZapine (ZYPREXA) 10 MG tablet Take 10 mg by mouth at bedtime.  . pantoprazole (PROTONIX) 40 MG tablet Take 40 mg by mouth 2 (two) times daily.   . potassium chloride (KLOR-CON) 20 MEQ packet Take 40 mEq by mouth 2 (two) times daily.  . ranitidine (ZANTAC) 75 MG tablet Take 75 mg by mouth at bedtime.   .  theophylline (THEODUR) 300 MG 12 hr tablet Take 300 mg by mouth every 12 (twelve) hours.   Marland Kitchen venlafaxine (EFFEXOR) 37.5 MG tablet Take 37.5 mg by mouth daily.   . [DISCONTINUED] ipratropium-albuterol (DUONEB) 0.5-2.5 (3) MG/3ML SOLN Take 3 mLs by nebulization 2 (two) times daily.  . [DISCONTINUED] levothyroxine (SYNTHROID, LEVOTHROID) 50 MCG tablet Take 50 mcg by mouth daily before breakfast.   No facility-administered encounter medications on file as of 07/31/2016.     Allergies (verified) Azithromycin; Cephalexin; Cephalosporins; Ciprofloxacin; Darvon [propoxyphene]; Flagyl [metronidazole]; Librax [chlordiazepoxide-clidinium]; Macrodantin [nitrofurantoin macrocrystal]; Nitrofuran derivatives; Other; Oxycodone-acetaminophen; Percocet [oxycodone-acetaminophen]; Quetiapine; Sulfa antibiotics; and Sulfamethoxazole   History: Past Medical History:  Diagnosis Date  . Anemia   . Anxiety   . Arthritis   . Asthma   . Barrett's esophagus   . Chronic kidney disease   . COPD (chronic obstructive pulmonary disease) (HCC)   . Dementia   . Depression   . Disease of thyroid gland   . Diverticulitis   . GERD (gastroesophageal reflux disease)   . Hyperlipemia   . Hypertension   . Rachel difficulties   . Osteoporosis   . Pancreatitis   . Psoriasis (a type of skin inflammation)   . Vertigo    Past Surgical History:  Procedure  Laterality Date  . BACK SURGERY    . brain shun    . CHOLECYSTECTOMY    . ROTATOR CUFF REPAIR    . SHOULDER SURGERY    . UPPER GI ENDOSCOPY  12/19/2015   [facility] Endo, Include esophagus, stomach & duodenum or / Jejunum: Dx w/wo biopsys and dilatation; Surgeon : Stasia Cavalier MD   Family History  Problem Relation Age of Onset  . Arthritis Mother   . Heart disease Mother   . Hypertension Mother   . Cancer Father   . Arthritis Father   . Heart disease Father   . Alzheimer's disease Sister   . Arthritis Sister   . Heart disease Sister   . Rachel loss Sister   .  Cancer Sister   . Heart attack Sister   . Cancer Brother   . Lung disease Brother   . Heart disease Brother    Social History   Occupational History  . Not on file.   Social History Main Topics  . Smoking status: Former Smoker    Quit date: 12/17/1990  . Smokeless tobacco: Never Used  . Alcohol use No  . Drug use: No  . Sexual activity: Not on file    Tobacco Counseling Counseling given: Not Answered   Activities of Daily Living In your present state of health, do you have any difficulty performing the following activities: 07/31/2016  Hearing? N  Vision? N  Difficulty concentrating or making decisions? Y  Walking or climbing stairs? Y  Dressing or bathing? Y  Doing errands, shopping? Y  Preparing Food and eating ? Y  Using the Toilet? Y  In the past six months, have you accidently leaked urine? Y  Do you have problems with loss of bowel control? Y  Managing your Medications? Y  Managing your Finances? Y  Housekeeping or managing your Housekeeping? Y  Some recent data might be hidden    Immunizations and Health Maintenance Immunization History  Administered Date(s) Administered  . Influenza, High Dose Seasonal PF 12/20/2014  . Influenza, Seasonal, Injecte, Preservative Fre 10/10/2014  . Influenza-Unspecified 12/07/2015  . PPD Test 02/10/2016, 02/26/2016  . Pneumococcal Conjugate-13 12/20/2014  . Td 08/26/2014   Health Maintenance Due  Topic Date Due  . PNA vac Low Risk Adult (2 of 2 - PPSV23) 12/20/2015    Patient Care Team: Shellia Cleverly, PA as PCP - General (General Practice)  Indicate any recent Medical Services you may have received from other than [facility] providers in the past year (date may be approximate).     Assessment:   This is a routine wellness examination for Aldie.   Hearing/Vision screen No exam data present  Dietary issues and exercise activities discussed: Current Exercise Habits: The patient does not participate in regular exercise  at present, Exercise limited by: orthopedic condition(s)  Goals    None     Depression Screen PHQ 2/9 Scores 07/31/2016  PHQ - 2 Score 2  PHQ- 9 Score 7    Fall Risk Fall Risk  07/31/2016  Falls in the past year? Yes  Number falls in past yr: 2 or more  Injury with Fall? No    Cognitive Function:     6CIT Screen 07/31/2016  What Year? 0 points  What month? 0 points  What time? 0 points  Count back from 20 0 points  Months in reverse 4 points  Repeat phrase 6 points  Total Score 10    Screening Tests Health Maintenance  Topic Date Due  . PNA vac Low Risk Adult (2 of 2 - PPSV23) 12/20/2015  . INFLUENZA VACCINE  09/11/2016  . TETANUS/TDAP  08/25/2024  . DEXA SCAN  Completed      Plan:    I have personally reviewed and addressed the Medicare Annual Wellness questionnaire and have noted the following in the patient's chart:  A. Medical and social history B. Use of alcohol, tobacco or illicit drugs  C. Current medications and supplements D. Functional ability and status E.  Nutritional status F.  Physical activity G. Advance directives H. List of other physicians I.  Hospitalizations, surgeries, and ER visits in previous 12 months J.  Vitals K. Screenings to include hearing, vision, cognitive, depression L. Referrals and appointments - none  In addition, I have reviewed and discussed with patient certain preventive protocols, quality metrics, and best practice recommendations. A written personalized care plan for preventive services as well as general preventive health recommendations were provided to patient.  See attached scanned questionnaire for additional information.   Signed,   Annetta MawSara Gonthier, RN Nurse Health Advisor   Quick Notes   Health Maintenance: DEXA due     Abnormal Screen: 6 Cit-10     Patient Concerns: Wheelchair hit L foot a couple weeks ago and is still in pain     Nurse Concerns: None

## 2016-07-31 NOTE — Patient Instructions (Signed)
[name] , Thank you for taking time to come for your Medicare Wellness Visit. I appreciate your ongoing commitment to your health goals. Please review the following plan we discussed and let me know if I can assist you in the future.   Screening recommendations/referrals: Colonoscopy up to date, pt over age 78 Mammogram up to date, pt over age 78 Bone Density due Recommended yearly ophthalmology/optometry visit for glaucoma screening and checkup Recommended yearly dental visit for hygiene and checkup  Vaccinations: Influenza vaccine up to date. Due 12/06/16 Pneumococcal vaccine up to date per chart Tdap vaccine due 08/25/2024 Shingles vaccine not in records  Advanced directives: DNR in chart  Conditions/risks identified:  None  Next appointment: Dr. Lyn HollingsheadAlexander does monthly rounds   Preventive Care 65 Years and Older, Female Preventive care refers to lifestyle choices and visits with your health care provider that can promote health and wellness. What does preventive care include?  A yearly physical exam. This is also called an annual well check.  Dental exams once or twice a year.  Routine eye exams. Ask your health care provider how often you should have your eyes checked.  Personal lifestyle choices, including:  Daily care of your teeth and gums.  Regular physical activity.  Eating a healthy diet.  Avoiding tobacco and drug use.  Limiting alcohol use.  Practicing safe sex.  Taking low-dose aspirin every day.  Taking vitamin and mineral supplements as recommended by your health care provider. What happens during an annual well check? The services and screenings done by your health care provider during your annual well check will depend on your age, overall health, lifestyle risk factors, and family history of disease. Counseling  Your health care provider may ask you questions about your:  Alcohol use.  Tobacco use.  Drug use.  Emotional  well-being.  Home and relationship well-being.  Sexual activity.  Eating habits.  History of falls.  Memory and ability to understand (cognition).  Work and work Astronomerenvironment.  Reproductive health. Screening  You may have the following tests or measurements:  Height, weight, and BMI.  Blood pressure.  Lipid and cholesterol levels. These may be checked every 5 years, or more frequently if you are over 78 years old.  Skin check.  Lung cancer screening. You may have this screening every year starting at age 78 if you have a 30-pack-year history of smoking and currently smoke or have quit within the past 15 years.  Fecal occult blood test (FOBT) of the stool. You may have this test every year starting at age 78.  Flexible sigmoidoscopy or colonoscopy. You may have a sigmoidoscopy every 5 years or a colonoscopy every 10 years starting at age 150.  Hepatitis C blood test.  Hepatitis B blood test.  Sexually transmitted disease (STD) testing.  Diabetes screening. This is done by checking your blood sugar (glucose) after you have not eaten for a while (fasting). You may have this done every 1-3 years.  Bone density scan. This is done to screen for osteoporosis. You may have this done starting at age 78.  Mammogram. This may be done every 1-2 years. Talk to your health care provider about how often you should have regular mammograms. Talk with your health care provider about your test results, treatment options, and if necessary, the need for more tests. Vaccines  Your health care provider may recommend certain vaccines, such as:  Influenza vaccine. This is recommended every year.  Tetanus, diphtheria, and acellular pertussis (Tdap,  Td) vaccine. You may need a Td booster every 10 years.  Zoster vaccine. You may need this after age 20.  Pneumococcal 13-valent conjugate (PCV13) vaccine. One dose is recommended after age 70.  Pneumococcal polysaccharide (PPSV23) vaccine. One  dose is recommended after age 52. Talk to your health care provider about which screenings and vaccines you need and how often you need them. This information is not intended to replace advice given to you by your health care provider. Make sure you discuss any questions you have with your health care provider. Document Released: 02/24/2015 Document Revised: 10/18/2015 Document Reviewed: 11/29/2014 Elsevier Interactive Patient Education  2017 [employer] Prevention in the Home Falls can cause injuries. They can happen to people of all ages. There are many things you can do to make your home safe and to help prevent falls. What can I do on the outside of my home?  Regularly fix the edges of walkways and driveways and fix any cracks.  Remove anything that might make you trip as you walk through a door, such as a raised step or threshold.  Trim any bushes or trees on the path to your home.  Use bright outdoor lighting.  Clear any walking paths of anything that might make someone trip, such as rocks or tools.  Regularly check to see if handrails are loose or broken. Make sure that both sides of any steps have handrails.  Any raised decks and porches should have guardrails on the edges.  Have any leaves, snow, or ice cleared regularly.  Use sand or salt on walking paths during winter.  Clean up any spills in your garage right away. This includes oil or grease spills. What can I do in the bathroom?  Use night lights.  Install grab bars by the toilet and in the tub and shower. Do not use towel bars as grab bars.  Use non-skid mats or decals in the tub or shower.  If you need to sit down in the shower, use a plastic, non-slip stool.  Keep the floor dry. Clean up any water that spills on the floor as soon as it happens.  Remove soap buildup in the tub or shower regularly.  Attach bath mats securely with double-sided non-slip rug tape.  Do not have throw rugs and other  things on the floor that can make you trip. What can I do in the bedroom?  Use night lights.  Make sure that you have a light by your bed that is easy to reach.  Do not use any sheets or blankets that are too big for your bed. They should not hang down onto the floor.  Have a firm chair that has side arms. You can use this for support while you get dressed.  Do not have throw rugs and other things on the floor that can make you trip. What can I do in the kitchen?  Clean up any spills right away.  Avoid walking on wet floors.  Keep items that you use a lot in easy-to-reach places.  If you need to reach something above you, use a strong step stool that has a grab bar.  Keep electrical cords out of the way.  Do not use floor polish or wax that makes floors slippery. If you must use wax, use non-skid floor wax.  Do not have throw rugs and other things on the floor that can make you trip. What can I do with my stairs?  Do not  leave any items on the stairs.  Make sure that there are handrails on both sides of the stairs and use them. Fix handrails that are broken or loose. Make sure that handrails are as long as the stairways.  Check any carpeting to make sure that it is firmly attached to the stairs. Fix any carpet that is loose or worn.  Avoid having throw rugs at the top or bottom of the stairs. If you do have throw rugs, attach them to the floor with carpet tape.  Make sure that you have a light switch at the top of the stairs and the bottom of the stairs. If you do not have them, ask someone to add them for you. What else can I do to help prevent falls?  Wear shoes that:  Do not have high heels.  Have rubber bottoms.  Are comfortable and fit you well.  Are closed at the toe. Do not wear sandals.  If you use a stepladder:  Make sure that it is fully opened. Do not climb a closed stepladder.  Make sure that both sides of the stepladder are locked into place.  Ask  someone to hold it for you, if possible.  Clearly mark and make sure that you can see:  Any grab bars or handrails.  First and last steps.  Where the edge of each step is.  Use tools that help you move around (mobility aids) if they are needed. These include:  Canes.  Walkers.  Scooters.  Crutches.  Turn on the lights when you go into a dark area. Replace any light bulbs as soon as they burn out.  Set up your furniture so you have a clear path. Avoid moving your furniture around.  If any of your floors are uneven, fix them.  If there are any pets around you, be aware of where they are.  Review your medicines with your doctor. Some medicines can make you feel dizzy. This can increase your chance of falling. Ask your doctor what other things that you can do to help prevent falls. This information is not intended to replace advice given to you by your health care provider. Make sure you discuss any questions you have with your health care provider. Document Released: 11/24/2008 Document Revised: 07/06/2015 Document Reviewed: 03/04/2014 Elsevier Interactive Patient Education  2017 [employer].

## 2016-07-31 NOTE — Progress Notes (Signed)
[facility] Room Number: 213D Place of Service:  SNF [phone]  Randon Goldsmithnne D. Lyn HollingsheadAlexander, MD  Patient Care Team: Shellia CleverlyBeane, Lori M, [place] as PCP - General [facility](General Practice)  Extended Emergency Contact Information Primary Emergency Contact: Rachel BarrierHopkins,Rachel          [name] [phone] [place] Home Phone: [phone] Relation: Niece Secondary Emergency Contact: [name],Dickerson  [place] Home Phone: [phone] Relation: Niece    Allergies: Azithromycin; Cephalexin; Cephalosporins; Ciprofloxacin; Darvon [propoxyphene]; Flagyl [metronidazole]; Librax [chlordiazepoxide-clidinium]; Macrodantin [nitrofurantoin macrocrystal]; Nitrofuran derivatives; Other; Oxycodone-acetaminophen; Percocet [oxycodone-acetaminophen]; Quetiapine; Sulfa antibiotics; and Sulfamethoxazole  Chief Complaint  Patient presents with  . Medical Management of Chronic Issues    Routine Visit    HPI: Patient is 76 y.o. female who Who is being seen for routine issues of hypothyroidism, OAB, and anxiety.  Past Medical History:  Diagnosis Date  . Anemia   . Anxiety   . Arthritis   . Asthma   . Barrett's esophagus   . Chronic kidney disease   . COPD (chronic obstructive pulmonary disease) (HCC)   . Dementia   . Depression   . Disease of thyroid gland   . Diverticulitis   . GERD (gastroesophageal reflux disease)   . Hyperlipemia   . Hypertension   . Memory difficulties   . Osteoporosis   . Pancreatitis   . Psoriasis (a type of skin inflammation)   . Vertigo     Past Surgical History:  Procedure Laterality Date  . BACK SURGERY    . brain shun    . CHOLECYSTECTOMY    . ROTATOR CUFF REPAIR    . SHOULDER SURGERY    . UPPER GI ENDOSCOPY  12/19/2015   [facility], Include esophagus, stomach & duodenum or / Jejunum: Dx w/wo biopsys and dilatation; Surgeon : Leonia ReevesAlbert John Rhoton MD    Allergies as of 07/31/2016      Reactions   Azithromycin Other (See  Comments)   unknown   Cephalexin Other (See Comments)   unknown   Cephalosporins    Ciprofloxacin Other (See Comments)   unknown   Darvon [propoxyphene]    Flagyl [metronidazole] Other (See Comments)   unknown   Librax [chlordiazepoxide-clidinium] Other (See Comments)   unknown   Macrodantin [nitrofurantoin Macrocrystal]    Nitrofuran Derivatives Other (See Comments)   unknown   Other Other (See Comments)   unknown   Oxycodone-acetaminophen Other (See Comments)   unknown   Percocet [oxycodone-acetaminophen]    Quetiapine Other (See Comments)   unknown Potassium level dropped.   Sulfa Antibiotics    Sulfamethoxazole Other (See Comments)   unknown      Medication List       Accurate as of 07/31/16 11:59 PM. Always use your most recent med list.          acetaminophen 325 MG tablet Commonly known as:  TYLENOL Take 650 mg by mouth every 8 (eight) hours as needed.   albuterol 108 (90 Base) MCG/ACT inhaler Commonly known as:  PROVENTIL HFA;VENTOLIN HFA Inhale 2 puffs into the lungs every 6 (six) hours as needed for wheezing.   amiodarone 200 MG tablet Commonly known as:  PACERONE Take 200 mg by mouth 2 (two) times daily.   clonazePAM 0.5 MG tablet Commonly known as:  KLONOPIN Take 0.5 mg by mouth 2 (two) times daily as needed.   clonazePAM 1 MG tablet Commonly known as:  KLONOPIN Take 2 mg by mouth at  bedtime. 2 tablets   D3-1000 1000 units tablet Generic drug:  Cholecalciferol Take 1,000 Units by mouth daily.   diltiazem 120 MG 24 hr capsule Commonly known as:  TIAZAC Take 120 mg by mouth daily.   divalproex 250 MG DR tablet Commonly known as:  DEPAKOTE Take 250 mg by mouth 2 (two) times daily.   donepezil 10 MG tablet Commonly known as:  ARICEPT Take 10 mg by mouth at bedtime.   ipratropium-albuterol 0.5-2.5 (3) MG/3ML Soln Commonly known as:  DUONEB Take 3 mLs by nebulization every 6 (six) hours as needed.   KETOCONAZOLE (BULK) Powd Apply 1  application topically 2 times daily as needed   levothyroxine 100 MCG tablet Commonly known as:  SYNTHROID, LEVOTHROID Take 100 mcg by mouth daily before breakfast.   OLANZapine 10 MG tablet Commonly known as:  ZYPREXA Take 10 mg by mouth at bedtime.   oxybutynin 5 MG tablet Commonly known as:  DITROPAN Take 5 mg by mouth 3 (three) times daily.   pantoprazole 40 MG tablet Commonly known as:  PROTONIX Take 40 mg by mouth 2 (two) times daily.   potassium chloride 20 MEQ packet Commonly known as:  KLOR-CON Take 40 mEq by mouth 2 (two) times daily.   ranitidine 75 MG tablet Commonly known as:  ZANTAC Take 75 mg by mouth at bedtime.   theophylline 300 MG 12 hr tablet Commonly known as:  THEODUR Take 300 mg by mouth every 12 (twelve) hours.   venlafaxine 37.5 MG tablet Commonly known as:  EFFEXOR Take 37.5 mg by mouth daily.       Meds ordered this encounter  Medications  . oxybutynin (DITROPAN) 5 MG tablet    Sig: Take 5 mg by mouth 3 (three) times daily.    Immunization History  Administered Date(s) Administered  . Influenza, High Dose Seasonal PF 12/20/2014  . Influenza, Seasonal, Injecte, Preservative Fre 10/10/2014  . Influenza-Unspecified 12/07/2015  . PPD Test 02/10/2016, 02/26/2016  . Pneumococcal Conjugate-13 12/20/2014  . Td 08/26/2014    Social History  Substance Use Topics  . Smoking status: Former Smoker    Quit date: 12/17/1990  . Smokeless tobacco: Never Used  . Alcohol use No    Review of Systems  DATA OBTAINED: from patient, nurse GENERAL:  no fevers, fatigue, appetite changes SKIN: No itching, rash HEENT: No complaint RESPIRATORY: No cough, wheezing, SOB CARDIAC: No chest pain, palpitations, lower extremity edema  GI: No abdominal pain, No N/V/D or constipation, No heartburn or reflux  GU: No dysuria, frequency or urgency, or incontinence  MUSCULOSKELETAL: No unrelieved bone/joint pain NEUROLOGIC: No headache, dizziness  PSYCHIATRIC:  No overt anxiety or sadness  Vitals:   07/31/16 0932  BP: 106/72  Pulse: 80  Resp: 18  Temp: 97.1 F (36.2 C)   Body mass index is 27.64 kg/m. Physical Exam  GENERAL APPEARANCE: Alert, conversant, No acute distress  SKIN: No diaphoresis rash HEENT: Unremarkable RESPIRATORY: Breathing is even, unlabored. Lung sounds are clear   CARDIOVASCULAR: Heart RRR no murmurs, rubs or gallops. No peripheral edema  GASTROINTESTINAL: Abdomen is soft, non-tender, not distended w/ normal bowel sounds.  GENITOURINARY: Bladder non tender, not distended  MUSCULOSKELETAL: No abnormal joints or musculature NEUROLOGIC: Cranial nerves 2-12 grossly intact. Moves all extremities PSYCHIATRIC: Mood and affect with dementia, no behavioral issues  Patient Active Problem List   Diagnosis Date Noted  . Mood disorder (HCC) 08/22/2016  . PNA (pneumonia) 05/10/2016  . COPD exacerbation (HCC) 05/10/2016  . UTI (  urinary tract infection) 05/10/2016  . Paroxysmal atrial fibrillation with RVR (HCC) 05/10/2016  . Metabolic encephalopathy 05/10/2016  . Elevated amylase and lipase 02/13/2016  . Acute encephalopathy 02/13/2016  . Psychosis 02/13/2016  . Dementia with behavioral disturbance 02/13/2016  . Overactive bladder 02/13/2016  . Chronic kidney disease   . COPD (chronic obstructive pulmonary disease) (HCC)   . Anemia   . Anxiety   . Barrett's esophagus   . Hypothyroidism   . GERD (gastroesophageal reflux disease)   . Hypertension   . Memory difficulties   . Osteoporosis   . Psoriasis (a type of skin inflammation)     CMP     Component Value Date/Time   NA 140 05/24/2016   K 4.3 05/24/2016   CL 100 (L) 01/26/2016 1935   CO2 30 01/26/2016 1935   GLUCOSE 115 (H) 01/26/2016 1935   BUN 17 05/24/2016   CREATININE 0.8 05/24/2016   CREATININE 0.83 01/26/2016 1935   CALCIUM 9.6 01/26/2016 1935   AST 23 05/24/2016   ALT 12 05/24/2016   ALKPHOS 84 05/24/2016   GFRNONAA >60 01/26/2016 1935   GFRAA  >60 01/26/2016 1935    Recent Labs  01/26/16 1935 01/26/16 1940  05/03/16 05/09/16 05/24/16  NA 139  --   < > 139 142 140  K 2.3*  --   < > 4.3 4.7 4.3  CL 100*  --   --   --   --   --   CO2 30  --   --   --   --   --   GLUCOSE 115*  --   --   --   --   --   BUN 10  --   < > 5 9 17   CREATININE 0.83  --   < > 0.7 1.0 0.8  CALCIUM 9.6  --   --   --   --   --   MG  --  2.2  --   --   --   --   < > = values in this interval not displayed.  Recent Labs  02/15/16 04/29/16 05/24/16  AST 30 24 23   ALT 20 15 12   ALKPHOS 60 105 84    Recent Labs  01/26/16 1935  05/09/16 05/24/16 05/30/16  WBC 8.1  < > 9.5 13.2 11.8  NEUTROABS 5.7  --   --   --   --   HGB 11.8*  < > 13.6 14.2 13.9  HCT 34.6*  < > 42 43 42  MCV 96.4  --   --   --   --   PLT 286  < > 469* 201 190  < > = values in this interval not displayed. No results for input(s): CHOL, LDLCALC, TRIG in the last 8760 hours.  Invalid input(s): HCL No results found for: MICROALBUR Lab Results  Component Value Date   TSH 51.55 (A) 07/22/2016   No results found for: HGBA1C No results found for: CHOL, HDL, LDLCALC, LDLDIRECT, TRIG, CHOLHDL  Significant Diagnostic Results in last 30 days:  No results found.  Assessment and Plan  Hypothyroidism Most recent TSH is 55.55 patient is on on 100 g we'll increase 137 g daily and repeat TSH in 1 month  Overactive bladder Stable, no recent infections; continue Ditropan 5 mg by mouth 3 times a day  Anxiety Chronic and stable continue Klonopin 2 mg by mouth daily at bedtime and Klonopin 0.5 mg by mouth twice a day  when necessary     Randon Goldsmith. [name], MD

## 2016-08-20 ENCOUNTER — Other Ambulatory Visit: Payer: Self-pay

## 2016-08-22 ENCOUNTER — Encounter: Payer: Self-pay | Admitting: Internal Medicine

## 2016-08-22 DIAGNOSIS — F39 Unspecified mood [affective] disorder: Secondary | ICD-10-CM | POA: Insufficient documentation

## 2016-08-22 NOTE — Assessment & Plan Note (Signed)
Mood appears stable, patient appears calm; continue Effexor 37.5 mg by mouth daily

## 2016-08-22 NOTE — Assessment & Plan Note (Signed)
Chronic and stable; continue Aricept 10 mg by mouth daily

## 2016-08-22 NOTE — Assessment & Plan Note (Signed)
Appears well controlled; continue Depakote 250 mg twice a day and Zyprexa 10 mg by mouth daily at bedtime

## 2016-08-23 LAB — HEPATIC FUNCTION PANEL
ALK PHOS: 79 (ref 25–125)
ALT: 14 (ref 7–35)
AST: 40 — AB (ref 13–35)
BILIRUBIN, TOTAL: 0.4

## 2016-08-24 ENCOUNTER — Encounter: Payer: Self-pay | Admitting: Internal Medicine

## 2016-08-26 LAB — CBC AND DIFFERENTIAL
HCT: 35 — AB (ref 36–46)
Hemoglobin: 11.5 — AB (ref 12.0–16.0)
Platelets: 171 (ref 150–399)
WBC: 6.3

## 2016-08-28 LAB — BASIC METABOLIC PANEL
BUN: 7 (ref 4–21)
CREATININE: 1 (ref 0.5–1.1)
Potassium: 3.4 (ref 3.4–5.3)
Sodium: 139 (ref 137–147)

## 2016-08-29 ENCOUNTER — Encounter: Payer: Self-pay | Admitting: Internal Medicine

## 2016-08-29 NOTE — Assessment & Plan Note (Signed)
Chronic and stable continue Klonopin 2 mg by mouth daily at bedtime and Klonopin 0.5 mg by mouth twice a day when necessary

## 2016-08-29 NOTE — Assessment & Plan Note (Signed)
Most recent TSH is 55.55 patient is on on 100 g we'll increase 137 g daily and repeat TSH in 1 month

## 2016-08-29 NOTE — Assessment & Plan Note (Signed)
Stable, no recent infections; continue Ditropan 5 mg by mouth 3 times a day

## 2016-08-30 ENCOUNTER — Non-Acute Institutional Stay (SKILLED_NURSING_FACILITY): Payer: Medicare Other | Admitting: Internal Medicine

## 2016-08-30 ENCOUNTER — Encounter: Payer: Self-pay | Admitting: Internal Medicine

## 2016-08-30 DIAGNOSIS — F0281 Dementia in other diseases classified elsewhere with behavioral disturbance: Secondary | ICD-10-CM

## 2016-08-30 DIAGNOSIS — R42 Dizziness and giddiness: Secondary | ICD-10-CM

## 2016-08-30 DIAGNOSIS — F419 Anxiety disorder, unspecified: Secondary | ICD-10-CM | POA: Diagnosis not present

## 2016-08-30 DIAGNOSIS — J449 Chronic obstructive pulmonary disease, unspecified: Secondary | ICD-10-CM | POA: Diagnosis not present

## 2016-08-30 DIAGNOSIS — G301 Alzheimer's disease with late onset: Secondary | ICD-10-CM | POA: Diagnosis not present

## 2016-08-30 DIAGNOSIS — N3281 Overactive bladder: Secondary | ICD-10-CM | POA: Diagnosis not present

## 2016-08-30 DIAGNOSIS — K2100 Gastro-esophageal reflux disease with esophagitis, without bleeding: Secondary | ICD-10-CM

## 2016-08-30 DIAGNOSIS — R072 Precordial pain: Secondary | ICD-10-CM

## 2016-08-30 DIAGNOSIS — F29 Unspecified psychosis not due to a substance or known physiological condition: Secondary | ICD-10-CM

## 2016-08-30 DIAGNOSIS — E559 Vitamin D deficiency, unspecified: Secondary | ICD-10-CM

## 2016-08-30 DIAGNOSIS — I480 Paroxysmal atrial fibrillation: Secondary | ICD-10-CM

## 2016-08-30 DIAGNOSIS — E876 Hypokalemia: Secondary | ICD-10-CM | POA: Diagnosis not present

## 2016-08-30 DIAGNOSIS — I952 Hypotension due to drugs: Secondary | ICD-10-CM

## 2016-08-30 DIAGNOSIS — F02818 Dementia in other diseases classified elsewhere, unspecified severity, with other behavioral disturbance: Secondary | ICD-10-CM

## 2016-08-30 DIAGNOSIS — F329 Major depressive disorder, single episode, unspecified: Secondary | ICD-10-CM | POA: Diagnosis not present

## 2016-08-30 DIAGNOSIS — F32A Depression, unspecified: Secondary | ICD-10-CM

## 2016-08-30 DIAGNOSIS — R55 Syncope and collapse: Secondary | ICD-10-CM

## 2016-08-30 NOTE — Progress Notes (Signed)
: Provider:  Randon Goldsmith. [name], MD Location:  [facility] Room Number: 208 Place of Service:  SNF [phone])  PCP: Shellia Cleverly, PA Patient Care Team: Shellia Cleverly, [place] as PCP - General [facility])  Extended Emergency Contact Information Primary Emergency Contact: [name] [phone] [name] of [place] Home Phone: [phone] Relation: Niece Secondary Emergency Contact: Dorice Lam[place] of [place] Home Phone: [phone] Relation: Niece     Allergies: Azithromycin; Cephalexin; Cephalosporins; Ciprofloxacin; Darvon [propoxyphene]; Flagyl [metronidazole]; Librax [chlordiazepoxide-clidinium]; Macrodantin [nitrofurantoin macrocrystal]; Nitrofuran derivatives; Other; Oxycodone-acetaminophen; Percocet [oxycodone-acetaminophen]; Quetiapine; Sulfa antibiotics; and Sulfamethoxazole  Chief Complaint  Patient presents with  . Readmit To SNF    following hospitalization 08/23/16 to 08/28/16 COPD worsening    HPI: Patient is 76 y.o. female with paroxysmal atrial fib, COPD D, anxiety and dementia who presented via EMS to [facility] ED with complaints of near syncopal episode and hypotension. Patient is a poor historian reported to have been complaining of chest pain which is now resolved, and also complaining of right-sided abdominal pain which was less on arrival to the ED. Patient was noted D to be having hypotension and bradycardia with heart rate of 60 and symptoms of lightheadedness and dizziness blood pressure improved after 2 liter normal saline bolus in the ED. Patient was admitted to [facility] from 7/13-18 for continued workup. Patient was seen by cardiologist for chest pain which is resolved, troponins were negative 2 and there were no significant EKG changes. Patient with history of paroxysmal atrial fib but was in sinus rhythm while in the hospitalization. Patient's hypotension was  addressed and discussed with endocrinologist found cortisol level to be 9.1 ACTH to be normal and nothing suggestive  of adrenal insufficiency. Hospital course was compensated by patient's dementia, anxiety and depression with psychosis and hallucinations. Patient's medications were adjusted by psychiatry. Patient is admitted back to skilled nursing facility for residential care. While at skilled nursing facility patient will be followed for dementia treated with Aricept, overactive bladder treated with Ditropan and vitamin D deficiency treated with replacement.  Past Medical History:  Diagnosis Date  . Anemia   . Anxiety   . Arthritis   . Asthma   . Barrett's esophagus   . Chronic kidney disease   . COPD (chronic obstructive pulmonary disease) (HCC)   . Dementia   . Depression   . Disease of thyroid gland   . Diverticulitis   . GERD (gastroesophageal reflux disease)   . Hyperlipemia   . Hypertension   . Memory difficulties   . Osteoporosis   . Pancreatitis   . Psoriasis (a type of skin inflammation)   . Vertigo     Past Surgical History:  Procedure Laterality Date  . BACK SURGERY    . brain shun    . CHOLECYSTECTOMY    . ROTATOR CUFF REPAIR    . SHOULDER SURGERY    . UPPER GI ENDOSCOPY  12/19/2015   [facility] End[facility], Include esophagus, stomach & duodenum or / Jejunum: Dx w/wo biopsys and dilatation; Surgeon : Leonia Reeves Rhoton MD    Allergies as of 08/30/2016      Reactions   Azithromycin Other (See Comments)   unknown   Cephalexin Other (See Comments)   unknown   Cephalosporins    Ciprofloxacin Other (See Comments)   unknown   Darvon [propoxyphene]    Flagyl [metronidazole] Other (  See Comments)   unknown   Librax [chlordiazepoxide-clidinium] Other (See Comments)   unknown   Macrodantin [nitrofurantoin Macrocrystal]    Nitrofuran Derivatives Other (See Comments)   unknown   Other Other (See Comments)   unknown   Oxycodone-acetaminophen Other (See Comments)   unknown     Percocet [oxycodone-acetaminophen]    Quetiapine Other (See Comments)   unknown Potassium level dropped.   Sulfa Antibiotics    Sulfamethoxazole Other (See Comments)   unknown      Medication List       Accurate as of 08/30/16 11:16 AM. Always use your most recent med list.          acetaminophen 325 MG tablet Commonly known as:  TYLENOL Take 650 mg by mouth every 8 (eight) hours as needed.   albuterol 108 (90 Base) MCG/ACT inhaler Commonly known as:  PROVENTIL HFA;VENTOLIN HFA Inhale 2 puffs into the lungs every 6 (six) hours as needed for wheezing.   clonazePAM 1 MG tablet Commonly known as:  KLONOPIN Take 1 mg by mouth. Take one tablet nightly for 5 days   D3-1000 1000 units tablet Generic drug:  Cholecalciferol Take 1,000 Units by mouth daily.   donepezil 10 MG tablet Commonly known as:  ARICEPT Take 10 mg by mouth at bedtime.   ipratropium-albuterol 0.5-2.5 (3) MG/3ML Soln Commonly known as:  DUONEB Take 3 mLs by nebulization every 4 (four) hours as needed.   ipratropium-albuterol 0.5-2.5 (3) MG/3ML Soln Commonly known as:  DUONEB Inhale into the lungs. Inhale once daily   levothyroxine 137 MCG tablet Commonly known as:  SYNTHROID, LEVOTHROID Take by mouth. Take one tablet every morning before breakfast   modafinil 100 MG tablet Commonly known as:  PROVIGIL Take 100 mg by mouth. Take one tablet daily for 5 days   oxybutynin 5 MG tablet Commonly known as:  DITROPAN Take 5 mg by mouth 3 (three) times daily.   pantoprazole 40 MG tablet Commonly known as:  PROTONIX Take 40 mg by mouth 2 (two) times daily.   perphenazine 2 MG tablet Commonly known as:  TRILAFON Take 2 mg by mouth. Take one tablet nightly   ranitidine 75 MG tablet Commonly known as:  ZANTAC Take 75 mg by mouth at bedtime.   theophylline 300 MG 12 hr tablet Commonly known as:  THEODUR Take 300 mg by mouth every 12 (twelve) hours.   venlafaxine 37.5 MG tablet Commonly known as:   EFFEXOR Take 37.5 mg by mouth daily.       Meds ordered this encounter  Medications  . modafinil (PROVIGIL) 100 MG tablet    Sig: Take 100 mg by mouth. Take one tablet daily for 5 days  . perphenazine (TRILAFON) 2 MG tablet    Sig: Take 2 mg by mouth. Take one tablet nightly  . clonazePAM (KLONOPIN) 1 MG tablet    Sig: Take 1 mg by mouth. Take one tablet nightly for 5 days  . ipratropium-albuterol (DUONEB) 0.5-2.5 (3) MG/3ML SOLN    Sig: Inhale into the lungs. Inhale once daily  . levothyroxine (SYNTHROID, LEVOTHROID) 137 MCG tablet    Sig: Take by mouth. Take one tablet every morning before breakfast  . DISCONTD: oxybutynin (DITROPAN) 5 MG tablet    Sig: Take 5 mg by mouth. Take one tablet daily    Immunization History  Administered Date(s) Administered  . Influenza, High Dose Seasonal PF 12/20/2014  . Influenza, Seasonal, Injecte, Preservative Fre 10/10/2014  . Influenza-Unspecified 12/07/2015  . PPD  Test 02/10/2016, 02/26/2016  . Pneumococcal Conjugate-13 12/20/2014  . Td 08/26/2014    Social History  Substance Use Topics  . Smoking status: Former Smoker    Quit date: 12/17/1990  . Smokeless tobacco: Never Used  . Alcohol use No    Family history is   Family History  Problem Relation Age of Onset  . Arthritis Mother   . Heart disease Mother   . Hypertension Mother   . Cancer Father   . Arthritis Father   . Heart disease Father   . Alzheimer's disease Sister   . Arthritis Sister   . Heart disease Sister   . Memory loss Sister   . Cancer Sister   . Heart attack Sister   . Cancer Brother   . Lung disease Brother   . Heart disease Brother       Review of Systems  DATA OBTAINED: from nursing - pt confused, speaks of going home, wasn't doing this before hosp GENERAL:  no fevers, fatigue, appetite changes SKIN: No itching, or rash EYES: No eye pain, redness, discharge EARS: No earache, tinnitus, change in hearing NOSE: No congestion, drainage or  bleeding  MOUTH/THROAT: No mouth or tooth pain, No sore throat RESPIRATORY: No cough, wheezing, SOB CARDIAC: No chest pain, palpitations, lower extremity edema  GI: No abdominal pain, No N/V/D or constipation, No heartburn or reflux  GU: No dysuria, frequency or urgency, or incontinence  MUSCULOSKELETAL: No unrelieved bone/joint pain NEUROLOGIC: No headache, dizziness or focal weakness PSYCHIATRIC: No c/o anxiety or sadness   Vitals:   08/30/16 1049  BP: 103/61  Pulse: 73  Resp: 18  Temp: 98.3 F (36.8 C)    SpO2 Readings from Last 1 Encounters:  07/31/16 93%   Body mass index is 29.81 kg/m.     Physical Exam  GENERAL APPEARANCE: Alert, conversant,  No acute distress.  SKIN: No diaphoresis rash HEAD: Normocephalic, atraumatic  EYES: Conjunctiva/lids clear. Pupils round, reactive. EOMs intact.  EARS: External exam WNL, canals clear. Hearing grossly normal.  NOSE: No deformity or discharge.  MOUTH/THROAT: Lips w/o lesions  RESPIRATORY: Breathing is even, unlabored. Lung sounds are clear   CARDIOVASCULAR: Heart RRR no murmurs, rubs or gallops. No peripheral edema.   GASTROINTESTINAL: Abdomen is soft, non-tender, not distended w/ normal bowel sounds. GENITOURINARY: Bladder non tender, not distended  MUSCULOSKELETAL: No abnormal joints or musculature NEUROLOGIC:  Cranial nerves 2-12 grossly intact. Moves all extremities  PSYCHIATRIC: Mood and affect With dementia, no behavioral issues  Patient Active Problem List   Diagnosis Date Noted  . Mood disorder (HCC) 08/22/2016  . PNA (pneumonia) 05/10/2016  . COPD exacerbation (HCC) 05/10/2016  . UTI (urinary tract infection) 05/10/2016  . Paroxysmal atrial fibrillation with RVR (HCC) 05/10/2016  . Metabolic encephalopathy 05/10/2016  . Elevated amylase and lipase 02/13/2016  . Acute encephalopathy 02/13/2016  . Psychosis 02/13/2016  . Dementia with behavioral disturbance 02/13/2016  . Overactive bladder 02/13/2016  .  Chronic kidney disease   . COPD (chronic obstructive pulmonary disease) (HCC)   . Anemia   . Anxiety   . Barrett's esophagus   . Hypothyroidism   . GERD (gastroesophageal reflux disease)   . Hypertension   . Memory difficulties   . Osteoporosis   . Psoriasis (a type of skin inflammation)       Labs reviewed: Basic Metabolic Panel:    Component Value Date/Time   NA 139 08/28/2016   K 3.4 08/28/2016   CL 100 (L) 01/26/2016 1935  CO2 30 01/26/2016 1935   GLUCOSE 115 (H) 01/26/2016 1935   BUN 7 08/28/2016   CREATININE 1.0 08/28/2016   CREATININE 0.83 01/26/2016 1935   CALCIUM 9.6 01/26/2016 1935   AST 40 (A) 08/23/2016   ALT 14 08/23/2016   ALKPHOS 79 08/23/2016   GFRNONAA >60 01/26/2016 1935   GFRAA >60 01/26/2016 1935     Recent Labs  01/26/16 1935 01/26/16 1940  05/09/16 05/24/16 08/28/16  NA 139  --   < > 142 140 139  K 2.3*  --   < > 4.7 4.3 3.4  CL 100*  --   --   --   --   --   CO2 30  --   --   --   --   --   GLUCOSE 115*  --   --   --   --   --   BUN 10  --   < > 9 17 7   CREATININE 0.83  --   < > 1.0 0.8 1.0  CALCIUM 9.6  --   --   --   --   --   MG  --  2.2  --   --   --   --   < > = values in this interval not displayed. Liver Function Tests:  Recent Labs  04/29/16 05/24/16 08/23/16  AST 24 23 40*  ALT 15 12 14   ALKPHOS 105 84 79    Recent Labs  01/26/16 1935  LIPASE 319*   No results for input(s): AMMONIA in the last 8760 hours. CBC:  Recent Labs  01/26/16 1935  05/24/16 05/30/16 08/26/16  WBC 8.1  < > 13.2 11.8 6.3  NEUTROABS 5.7  --   --   --   --   HGB 11.8*  < > 14.2 13.9 11.5*  HCT 34.6*  < > 43 42 35*  MCV 96.4  --   --   --   --   PLT 286  < > 201 190 171  < > = values in this interval not displayed. Lipid No results for input(s): CHOL, HDL, LDLCALC, TRIG in the last 8760 hours.  Cardiac Enzymes: No results for input(s): CKTOTAL, CKMB, CKMBINDEX, TROPONINI in the last 8760 hours. BNP: No results for input(s): BNP in  the last 8760 hours. No results found for: MICROALBUR No results found for: HGBA1C Lab Results  Component Value Date   TSH 51.55 (A) 07/22/2016   Lab Results  Component Value Date   VITAMINB12 1,188 05/24/2016   No results found for: FOLATE No results found for: IRON, TIBC, FERRITIN  Imaging and Procedures obtained prior to SNF admission: Ct Abdomen Pelvis W Contrast  Result Date: 01/26/2016 CLINICAL DATA:  Lower abdominal pain and low back pain EXAM: CT ABDOMEN AND PELVIS WITH CONTRAST TECHNIQUE: Multidetector CT imaging of the abdomen and pelvis was performed using the standard protocol following bolus administration of intravenous contrast. CONTRAST:  ISOVUE-300 IOPAMIDOL (ISOVUE-300) INJECTION 61% COMPARISON:  Chest radiograph 11/01/2007 FINDINGS: Lower chest: No pulmonary nodules. No visible pleural or pericardial effusion. Hepatobiliary: Normal hepatic size and contours without focal liver lesion. No perihepatic ascites. No intra- or extrahepatic biliary dilatation. Status post cholecystectomy. Pancreas: There is inflammatory stranding surrounding the head of the pancreas. No peripancreatic fluid collection. The pancreatic duct is not dilated. Spleen: Normal. Adrenals/Urinary Tract: Normal adrenal glands. No hydronephrosis or solid renal mass. Stomach/Bowel: There is descending colonic and sigmoid diverticulosis without evidence of acute inflammation.  No dilated small bowel. No abdominal fluid collection. Normal appendix. Vascular/Lymphatic: There is atherosclerotic calcification of the non aneurysmal abdominal aorta. No abdominal or pelvic adenopathy. Reproductive: Normal uterus and ovaries for age. Musculoskeletal: There is compression deformity of the T8 vertebral body, favored to be chronic. This appears unchanged relative to the radiograph of 11/01/2007 Normal visualized extrathoracic and extraperitoneal soft tissues. Other: No contributory non-categorized findings. IMPRESSION: 1.  Acute pancreatitis without evidence of pancreatic necrosis or peripancreatic fluid collection. 2. Aortic atherosclerosis. 3. Descending colonic and sigmoid diverticulosis without acute diverticulitis. Electronically Signed   By: Deatra Robinson M.D.   On: 01/26/2016 21:57     Not all labs, radiology exams or other studies done during hospitalization come through on my EPIC note; however they are reviewed by me.    Assessment and Plan  HYPOTENSION/PRESYNCOPE-blood pressure improved after 2 L normal saline in the ER, white blood cell count was normal chest x-ray was negative there was no signs of sepsis/infection, SBP around 100; felt to be most likely due to patient's amiodarone and Cardizem which were DC'd by cardiology; cortisol level was 9.1, ACTH was normal and cosyntropin estimation test was not suggestive of adrenal insufficiency SNF -admitted for residential care; will be involved with OT/PT; will avoid meds which are likely to cause hypotension  CHEST PAIN-resolved, troponins 2 was negative; no significant EKG changes; avoid nitroglycerin; cardiology recommended no further workup  P A F-patient in normal sinus rhythm; CHA2D-VASC score is 3. Patient is not on anticoagulation and holding Cardizem and amiodarone for now per cardiology patient will need follow-up as outpatient SNF - patient on the meds at the moment; will monitor for recurrent; set up appointment with cardiology as outpatient  [facility] SNF - will follow-up BMP  DEMENTIA/ANXIETY/DEPRESSION/PSYCHOSIS-with hallucinations and he: Medications were adjusted by psychiatry Depakote and Zyprexa were discontinued and the patient was started on Klonopin Aricept Effexor perphenazine and modafinil SNF - the patient appears confused and at her baseline at the present moment; continue Klonopin 1 mg by mouth daily at bedtime Aricept 10 mg by mouth daily at bedtime Effexor 37.5 mg by mouth daily, perphenazine 2 mg by mouth twice a  day and modafinil 50 mg by mouth daily. We'll continue to monitor and have psych nurse to see  COPD-chronic and stable SNF - continue theophylline 300 mg 24-hour One Every 12 hours DuoNeb when necessary, and albuterol MDI when necessary  OAB SNF - stable no complaints continue Ditropan 5 mg on mouth 3 times a day  VITAMIN D DEFICIENCY SNF - not stated as uncontrolled continue replacement 1000 units daily  GERD SNF - plan to continue Protonix 40 mg twice a day and Zantac 75 mg by mouth nightly   Time spent greater than 35 minutes;> 50% of time with patient was spent reviewing records, labs, tests and studies, counseling and developing plan of care  Thurston Hole D. [name], MD

## 2016-08-31 ENCOUNTER — Encounter: Payer: Self-pay | Admitting: Internal Medicine

## 2016-08-31 DIAGNOSIS — I959 Hypotension, unspecified: Secondary | ICD-10-CM | POA: Insufficient documentation

## 2016-08-31 DIAGNOSIS — E559 Vitamin D deficiency, unspecified: Secondary | ICD-10-CM

## 2016-08-31 DIAGNOSIS — F329 Major depressive disorder, single episode, unspecified: Secondary | ICD-10-CM | POA: Insufficient documentation

## 2016-08-31 DIAGNOSIS — E876 Hypokalemia: Secondary | ICD-10-CM | POA: Insufficient documentation

## 2016-08-31 DIAGNOSIS — F32A Depression, unspecified: Secondary | ICD-10-CM | POA: Insufficient documentation

## 2016-08-31 DIAGNOSIS — R079 Chest pain, unspecified: Secondary | ICD-10-CM | POA: Insufficient documentation

## 2016-08-31 DIAGNOSIS — R42 Dizziness and giddiness: Secondary | ICD-10-CM | POA: Insufficient documentation

## 2016-08-31 DIAGNOSIS — I480 Paroxysmal atrial fibrillation: Secondary | ICD-10-CM | POA: Insufficient documentation

## 2016-08-31 DIAGNOSIS — R55 Syncope and collapse: Secondary | ICD-10-CM

## 2016-08-31 HISTORY — DX: Vitamin D deficiency, unspecified: E55.9

## 2016-09-11 ENCOUNTER — Other Ambulatory Visit: Payer: Self-pay

## 2016-09-11 MED ORDER — CLONAZEPAM 1 MG PO TABS: ORAL_TABLET | ORAL | 0 refills | Status: DC

## 2016-09-11 MED ORDER — CLONAZEPAM 0.5 MG PO TABS: 0.5000 mg | ORAL_TABLET | Freq: Two times a day (BID) | ORAL | 0 refills | Status: DC | PRN

## 2016-09-18 LAB — TSH: TSH: 11.31 — AB (ref 0.41–5.90)

## 2016-09-20 ENCOUNTER — Other Ambulatory Visit: Payer: Self-pay

## 2016-09-24 ENCOUNTER — Non-Acute Institutional Stay (SKILLED_NURSING_FACILITY): Payer: Medicare Other | Admitting: Internal Medicine

## 2016-09-24 DIAGNOSIS — R251 Tremor, unspecified: Secondary | ICD-10-CM

## 2016-09-24 DIAGNOSIS — J439 Emphysema, unspecified: Secondary | ICD-10-CM

## 2016-09-25 ENCOUNTER — Encounter: Payer: Self-pay | Admitting: Internal Medicine

## 2016-09-25 NOTE — Progress Notes (Signed)
Location:  [facility] Room Number: 213D Place of Service:  SNF [id number])  Provider: Randon Goldsmith. [name], MD  Shellia Cleverly, PA  Patient Care Team: Shellia Cleverly, [place] as PCP - General [facility])  Extended Emergency Contact Information Primary Emergency Contact: [name] [phone] [name] of [place] Home Phone: [phone] Relation: Niece Secondary Emergency Contact: Dorice Lamas [place] Home Phone: [phone] Relation: Niece    Allergies: Azithromycin; Cephalexin; Cephalosporins; Ciprofloxacin; Darvon [propoxyphene]; Flagyl [metronidazole]; Librax [chlordiazepoxide-clidinium]; Macrodantin [nitrofurantoin macrocrystal]; Nitrofuran derivatives; Other; Oxycodone-acetaminophen; Percocet [oxycodone-acetaminophen]; Quetiapine; Sulfa antibiotics; and Sulfamethoxazole  Chief Complaint  Patient presents with  . Acute Visit    HPI: Patient is 76 y.o. female who Who started as to speak to me today regarding a medication the patient is on. Patient has just come back from a doctor's appointment and there was some concern about her being on theophylline. Patient has had upper extremity tremors, although she is not having any now, and her outside physician would like to see what she looks like without her theophylline. Patient has had no respiratory problems or asthma COPD exacerbations recently.  Past Medical History:  Diagnosis Date  . Anemia   . Anxiety   . Arthritis   . Asthma   . Barrett's esophagus   . Chronic kidney disease   . COPD (chronic obstructive pulmonary disease) (HCC)   . Dementia   . Depression   . Disease of thyroid gland   . Diverticulitis   . GERD (gastroesophageal reflux disease)   . Hyperlipemia   . Hypertension   . Memory difficulties   . Osteoporosis   . Pancreatitis   . Psoriasis (a type of skin inflammation)   . Vertigo     Past Surgical History:  Procedure Laterality Date    . BACK SURGERY    . brain shun    . CHOLECYSTECTOMY    . ROTATOR CUFF REPAIR    . SHOULDER SURGERY    . UPPER GI ENDOSCOPY  12/19/2015   [facility], Include esophagus, stomach & duodenum or / Jejunum: Dx w/wo biopsys and dilatation; Surgeon : Leonia Reeves Rhoton MD    Allergies as of 09/24/2016      Reactions   Azithromycin Other (See Comments)   unknown   Cephalexin Other (See Comments)   unknown   Cephalosporins    Ciprofloxacin Other (See Comments)   unknown   Darvon [propoxyphene]    Flagyl [metronidazole] Other (See Comments)   unknown   Librax [chlordiazepoxide-clidinium] Other (See Comments)   unknown   Macrodantin [nitrofurantoin Macrocrystal]    Nitrofuran Derivatives Other (See Comments)   unknown   Other Other (See Comments)   unknown   Oxycodone-acetaminophen Other (See Comments)   unknown   Percocet [oxycodone-acetaminophen]    Quetiapine Other (See Comments)   unknown Potassium level dropped.   Sulfa Antibiotics    Sulfamethoxazole Other (See Comments)   unknown      Medication List       Accurate as of 09/24/16 11:59 PM. Always use your most recent med list.          acetaminophen 325 MG tablet Commonly known as:  TYLENOL Take 650 mg by mouth every 8 (eight) hours as needed.   albuterol 108 (90 Base) MCG/ACT inhaler Commonly known as:  PROVENTIL HFA;VENTOLIN HFA Inhale 2 puffs into the lungs every 6 (six) hours as needed for  wheezing.   clonazePAM 1 MG tablet Commonly known as:  KLONOPIN Give 1 mg by mouth at bedtime   clonazePAM 0.5 MG tablet Commonly known as:  KLONOPIN Take 1 tablet (0.5 mg total) by mouth 2 (two) times daily as needed for anxiety.   D3-1000 1000 units tablet Generic drug:  Cholecalciferol Take 1,000 Units by mouth daily.   donepezil 10 MG tablet Commonly known as:  ARICEPT Take 10 mg by mouth at bedtime.   ipratropium-albuterol 0.5-2.5 (3) MG/3ML Soln Commonly known as:  DUONEB Take 3 mLs by nebulization every 4  (four) hours as needed.   ipratropium-albuterol 0.5-2.5 (3) MG/3ML Soln Commonly known as:  DUONEB Inhale into the lungs. Inhale once daily   levothyroxine 137 MCG tablet Commonly known as:  SYNTHROID, LEVOTHROID Take by mouth. Take one tablet every morning before breakfast   oxybutynin 5 MG tablet Commonly known as:  DITROPAN Take 5 mg by mouth 3 (three) times daily.   pantoprazole 40 MG tablet Commonly known as:  PROTONIX Take 40 mg by mouth 2 (two) times daily.   perphenazine 2 MG tablet Commonly known as:  TRILAFON Take 2 mg by mouth. Take one tablet nightly   ranitidine 75 MG tablet Commonly known as:  ZANTAC Take 75 mg by mouth at bedtime.   theophylline 300 MG 12 hr tablet Commonly known as:  THEODUR Take 300 mg by mouth every 12 (twelve) hours.   venlafaxine 37.5 MG tablet Commonly known as:  EFFEXOR Take 37.5 mg by mouth daily.       No orders of the defined types were placed in this encounter.   Immunization History  Administered Date(s) Administered  . Influenza, High Dose Seasonal PF 12/20/2014  . Influenza, Seasonal, Injecte, Preservative Fre 10/10/2014  . Influenza-Unspecified 12/07/2015  . PPD Test 02/10/2016, 02/26/2016  . Pneumococcal Conjugate-13 12/20/2014  . Td 08/26/2014    Social History  Substance Use Topics  . Smoking status: Former Smoker    Quit date: 12/17/1990  . Smokeless tobacco: Never Used  . Alcohol use No    Review of Systems  DATA OBTAINED: from  family member GENERAL:  no fevers, fatigue, appetite changes SKIN: No itching, rash HEENT: No complaint RESPIRATORY: No cough, wheezing, SOB CARDIAC: No chest pain, palpitations, lower extremity edema  GI: No abdominal pain, No N/V/D or constipation, No heartburn or reflux  GU: No dysuria, frequency or urgency, or incontinence  MUSCULOSKELETAL: No unrelieved bone/joint pain NEUROLOGIC: Upper extremity tremors as per history of present illness  PSYCHIATRIC: No overt anxiety  or sadness  Vitals:   09/24/16 1319  BP: 106/72  Pulse: 73  Resp: 18  Temp: (!) 97.1 F (36.2 C)   Body mass index is 28.17 kg/m. Physical Exam  GENERAL APPEARANCE: Alert, Minimally conversant, No acute distress  SKIN: No diaphoresis rash HEENT: Unremarkable RESPIRATORY: Breathing is even, unlabored. Lung sounds are clear   CARDIOVASCULAR: Heart RRR no murmurs, rubs or gallops. No peripheral edema  GASTROINTESTINAL: Abdomen is soft, non-tender, not distended w/ normal bowel sounds.  GENITOURINARY: Bladder non tender, not distended  MUSCULOSKELETAL: No abnormal joints or musculature NEUROLOGIC: Cranial nerves 2-12 grossly intact. Moves all extremities; no tremors PSYCHIATRIC: Mood and affect with dementia, no behavioral issues  Patient Active Problem List   Diagnosis Date Noted  . Hypotension 08/31/2016  . Postural dizziness with presyncope 08/31/2016  . Chest pain 08/31/2016  . Paroxysmal atrial fibrillation (HCC) 08/31/2016  . Hypokalemia 08/31/2016  . Depression 08/31/2016  . Vitamin  D deficiency 08/31/2016  . Mood disorder (HCC) 08/22/2016  . PNA (pneumonia) 05/10/2016  . COPD exacerbation (HCC) 05/10/2016  . UTI (urinary tract infection) 05/10/2016  . Paroxysmal atrial fibrillation with RVR (HCC) 05/10/2016  . Metabolic encephalopathy 05/10/2016  . Elevated amylase and lipase 02/13/2016  . Acute encephalopathy 02/13/2016  . Psychosis 02/13/2016  . Dementia with behavioral disturbance 02/13/2016  . Overactive bladder 02/13/2016  . Chronic kidney disease   . COPD (chronic obstructive pulmonary disease) (HCC)   . Anemia   . Anxiety   . Barrett's esophagus   . Hypothyroidism   . GERD (gastroesophageal reflux disease)   . Hypertension   . Memory difficulties   . Osteoporosis   . Psoriasis (a type of skin inflammation)     CMP     Component Value Date/Time   NA 139 08/28/2016   K 3.4 08/28/2016   CL 100 (L) 01/26/2016 1935   CO2 30 01/26/2016 1935    GLUCOSE 115 (H) 01/26/2016 1935   BUN 7 08/28/2016   CREATININE 1.0 08/28/2016   CREATININE 0.83 01/26/2016 1935   CALCIUM 9.6 01/26/2016 1935   AST 40 (A) 08/23/2016   ALT 14 08/23/2016   ALKPHOS 79 08/23/2016   GFRNONAA >60 01/26/2016 1935   GFRAA >60 01/26/2016 1935    Recent Labs  01/26/16 1935 01/26/16 1940  05/09/16 05/24/16 08/28/16  NA 139  --   < > 142 140 139  K 2.3*  --   < > 4.7 4.3 3.4  CL 100*  --   --   --   --   --   CO2 30  --   --   --   --   --   GLUCOSE 115*  --   --   --   --   --   BUN 10  --   < > 9 17 7   CREATININE 0.83  --   < > 1.0 0.8 1.0  CALCIUM 9.6  --   --   --   --   --   MG  --  2.2  --   --   --   --   < > = values in this interval not displayed.  Recent Labs  04/29/16 05/24/16 08/23/16  AST 24 23 40*  ALT 15 12 14   ALKPHOS 105 84 79    Recent Labs  01/26/16 1935  05/24/16 05/30/16 08/26/16  WBC 8.1  < > 13.2 11.8 6.3  NEUTROABS 5.7  --   --   --   --   HGB 11.8*  < > 14.2 13.9 11.5*  HCT 34.6*  < > 43 42 35*  MCV 96.4  --   --   --   --   PLT 286  < > 201 190 171  < > = values in this interval not displayed. No results for input(s): CHOL, LDLCALC, TRIG in the last 8760 hours.  Invalid input(s): HCL No results found for: [facility] Lab Results  Component Value Date   TSH 11.31 (A) 09/18/2016   No results found for: HGBA1C No results found for: CHOL, HDL, LDLCALC, LDLDIRECT, TRIG, CHOLHDL  Significant Diagnostic Results in last 30 days:  No results found.  Assessment and Plan  COPD/UPPER EXTREMITY TREMOR-patient has been on theophylline for years, however it is a medication that is commonly in use now; I think this is an excellent time to test whether patient COPD can be maintained without theophylline and also  to see if the off theophylline has any impact on her tremors; patient is currently on theophylline 24 hours 300 mg a day-will decrease to regular theophylline 200 mg twice a day for 7 days then 100 mg twice a day; if  patient is doing well respiratory wise at this point we will DC her theophylline altogether. Discussed this plan with the daughter and everyone was in agreement.   Randon GoldsmithAnne D. Lyn HollingsheadAlexander, MD

## 2016-09-28 ENCOUNTER — Encounter: Payer: Self-pay | Admitting: Internal Medicine

## 2016-10-01 ENCOUNTER — Encounter: Payer: Self-pay | Admitting: Internal Medicine

## 2016-10-01 ENCOUNTER — Non-Acute Institutional Stay (SKILLED_NURSING_FACILITY): Payer: Medicare Other | Admitting: Internal Medicine

## 2016-10-01 DIAGNOSIS — J069 Acute upper respiratory infection, unspecified: Secondary | ICD-10-CM

## 2016-10-01 NOTE — Progress Notes (Signed)
Location:  [facility]   Place of Service:  S[facility]  [name] M.D.  Patient Care Team: Shellia Cleverly, [place] as PCP - General [facility])  Extended Emergency Contact Information Primary Emergency Contact: [name] [phone] [name] of [place] Home Phone: [phone] Relation: Niece Secondary Emergency Contact: Dorice Lamas [place] Home Phone: [phone] Relation: Niece    Allergies: Azithromycin; Cephalexin; Cephalosporins; Ciprofloxacin; Darvon [propoxyphene]; Flagyl [metronidazole]; Librax [chlordiazepoxide-clidinium]; Macrodantin [nitrofurantoin macrocrystal]; Nitrofuran derivatives; Other; Oxycodone-acetaminophen; Percocet [oxycodone-acetaminophen]; Quetiapine; Sulfa antibiotics; and Sulfamethoxazole  Chief Complaint  Patient presents with  . Acute Visit    Patient started thinks her mother has a sinus infection    HPI: Patient is 76 y.o. female whose Daughter asked me to see her mother today because she thinks her her mother has a sinus infection. She says she was told her mother had a fever last night, however it was reported to the morning nurse that patient was afebrile. Patient has been afebrile today. The daughter says the mother complains of some forehead pain and she has had a cough although the patient did not cough during interview. Nothing makes it better and nothing makes it worse. Patient denies that her chest hurts or that she is short of breath. Patient denies that she is having to blow her nose.   Past Medical History:  Diagnosis Date  . Anemia   . Anxiety   . Arthritis   . Asthma   . Barrett's esophagus   . Chronic kidney disease   . COPD (chronic obstructive pulmonary disease) (HCC)   . Dementia   . Depression   . Disease of thyroid gland   . Diverticulitis   . GERD (gastroesophageal reflux disease)   . Hyperlipemia   . Hypertension   .  Memory difficulties   . Osteoporosis   . Pancreatitis   . Psoriasis (a type of skin inflammation)   . Vertigo     Past Surgical History:  Procedure Laterality Date  . BACK SURGERY    . brain shun    . CHOLECYSTECTOMY    . ROTATOR CUFF REPAIR    . SHOULDER SURGERY    . UPPER GI ENDOSCOPY  12/19/2015   [facility], Include esophagus, stomach & duodenum or / Jejunum: Dx w/wo biopsys and dilatation; Surgeon : Leonia Reeves Rhoton MD    Allergies as of 10/01/2016      Reactions   Azithromycin Other (See Comments)   unknown   Cephalexin Other (See Comments)   unknown   Cephalosporins    Ciprofloxacin Other (See Comments)   unknown   Darvon [propoxyphene]    Flagyl [metronidazole] Other (See Comments)   unknown   Librax [chlordiazepoxide-clidinium] Other (See Comments)   unknown   Macrodantin [nitrofurantoin Macrocrystal]    Nitrofuran Derivatives Other (See Comments)   unknown   Other Other (See Comments)   unknown   Oxycodone-acetaminophen Other (See Comments)   unknown   Percocet [oxycodone-acetaminophen]    Quetiapine Other (See Comments)   unknown Potassium level dropped.   Sulfa Antibiotics    Sulfamethoxazole Other (See Comments)   unknown      Medication List       Accurate as of 10/01/16 11:59 PM. Always use your most recent med list.          acetaminophen 325 MG tablet Commonly known as:  TYLENOL Take 650 mg  by mouth every 8 (eight) hours as needed.   albuterol 108 (90 Base) MCG/ACT inhaler Commonly known as:  PROVENTIL HFA;VENTOLIN HFA Inhale 2 puffs into the lungs every 6 (six) hours as needed for wheezing.   clonazePAM 1 MG tablet Commonly known as:  KLONOPIN Give 1 mg by mouth at bedtime   clonazePAM 0.5 MG tablet Commonly known as:  KLONOPIN Take 1 tablet (0.5 mg total) by mouth 2 (two) times daily as needed for anxiety.   D3-1000 1000 units tablet Generic drug:  Cholecalciferol Take 1,000 Units by mouth daily.   donepezil 10 MG  tablet Commonly known as:  ARICEPT Take 10 mg by mouth at bedtime.   ipratropium-albuterol 0.5-2.5 (3) MG/3ML Soln Commonly known as:  DUONEB Take 3 mLs by nebulization every 4 (four) hours as needed.   ipratropium-albuterol 0.5-2.5 (3) MG/3ML Soln Commonly known as:  DUONEB Inhale into the lungs. Inhale once daily   modafinil 100 MG tablet Commonly known as:  PROVIGIL Take 100 mg by mouth. Take one tablet daily for 5 days   oxybutynin 5 MG tablet Commonly known as:  DITROPAN Take 5 mg by mouth 3 (three) times daily.   pantoprazole 40 MG tablet Commonly known as:  PROTONIX Take 40 mg by mouth 2 (two) times daily.   perphenazine 2 MG tablet Commonly known as:  TRILAFON Take 2 mg by mouth. Take one tablet nightly   ranitidine 75 MG tablet Commonly known as:  ZANTAC Take 75 mg by mouth at bedtime.   theophylline 300 MG 12 hr tablet Commonly known as:  THEODUR Take 300 mg by mouth every 12 (twelve) hours.   venlafaxine 37.5 MG tablet Commonly known as:  EFFEXOR Take 37.5 mg by mouth daily.       No orders of the defined types were placed in this encounter.   Immunization History  Administered Date(s) Administered  . Influenza, High Dose Seasonal PF 12/20/2014  . Influenza, Seasonal, Injecte, Preservative Fre 10/10/2014  . Influenza-Unspecified 12/07/2015  . PPD Test 02/10/2016, 02/26/2016  . Pneumococcal Conjugate-13 12/20/2014  . Td 08/26/2014    Social History  Substance Use Topics  . Smoking status: Former Smoker    Quit date: 12/17/1990  . Smokeless tobacco: Never Used  . Alcohol use No    Review of Systems  DATA OBTAINED: from patient-Minimal input, nurse, family member GENERAL:  no fevers, fatigue, appetite changes SKIN: No itching, rash HEENT: As per history of present illness daughter says she is complaining of forehead pain RESPIRATORY: +cough, no wheezing, no SOB CARDIAC: No chest pain, palpitations, lower extremity edema  GI: No abdominal  pain, No N/V/D or constipation, No heartburn or reflux  GU: No dysuria, frequency or urgency, or incontinence  MUSCULOSKELETAL: No unrelieved bone/joint pain NEUROLOGIC: No headache, dizziness  PSYCHIATRIC: No overt anxiety or sadness  Vitals:   10/03/16 1451  BP: 106/72  Pulse: (!) 59  Resp: 18  Temp: (!) 97.1 F (36.2 C)   Body mass index is 28.2 kg/m. Physical Exam  GENERAL APPEARANCE: Alert, conversant, No acute distress  SKIN: No diaphoresis rash; patient with a shunt up under the skin on her right forehead-patient's daughter did not know what this was but the patient did, it's her shunt HEENT: Unremarkable; throat is not red, no postnasal drainage RESPIRATORY: Breathing is even, unlabored. Lung sounds are clear; did not cough during interview   CARDIOVASCULAR: Heart RRR no murmurs, rubs or gallops. No peripheral edema  GASTROINTESTINAL: Abdomen is soft, non-tender,  not distended w/ normal bowel sounds.  GENITOURINARY: Bladder non tender, not distended  MUSCULOSKELETAL: No abnormal joints or musculature NEUROLOGIC: Cranial nerves 2-12 grossly intact. Moves all extremities PSYCHIATRIC: Mood and affect pleasant with dementia, no behavioral issues  Patient Active Problem List   Diagnosis Date Noted  . Hypotension 08/31/2016  . Postural dizziness with presyncope 08/31/2016  . Chest pain 08/31/2016  . Paroxysmal atrial fibrillation (HCC) 08/31/2016  . Hypokalemia 08/31/2016  . Depression 08/31/2016  . Vitamin D deficiency 08/31/2016  . Mood disorder (HCC) 08/22/2016  . PNA (pneumonia) 05/10/2016  . COPD exacerbation (HCC) 05/10/2016  . UTI (urinary tract infection) 05/10/2016  . Paroxysmal atrial fibrillation with RVR (HCC) 05/10/2016  . Metabolic encephalopathy 05/10/2016  . Elevated amylase and lipase 02/13/2016  . Acute encephalopathy 02/13/2016  . Psychosis 02/13/2016  . Dementia with behavioral disturbance 02/13/2016  . Overactive bladder 02/13/2016  . Chronic  kidney disease   . COPD (chronic obstructive pulmonary disease) (HCC)   . Anemia   . Anxiety   . Barrett's esophagus   . Hypothyroidism   . GERD (gastroesophageal reflux disease)   . Hypertension   . Memory difficulties   . Osteoporosis   . Psoriasis (a type of skin inflammation)     CMP     Component Value Date/Time   NA 139 08/28/2016   K 3.4 08/28/2016   CL 100 (L) 01/26/2016 1935   CO2 30 01/26/2016 1935   GLUCOSE 115 (H) 01/26/2016 1935   BUN 7 08/28/2016   CREATININE 1.0 08/28/2016   CREATININE 0.83 01/26/2016 1935   CALCIUM 9.6 01/26/2016 1935   AST 40 (A) 08/23/2016   ALT 14 08/23/2016   ALKPHOS 79 08/23/2016   GFRNONAA >60 01/26/2016 1935   GFRAA >60 01/26/2016 1935    Recent Labs  01/26/16 1935 01/26/16 1940  05/09/16 05/24/16 08/28/16  NA 139  --   < > 142 140 139  K 2.3*  --   < > 4.7 4.3 3.4  CL 100*  --   --   --   --   --   CO2 30  --   --   --   --   --   GLUCOSE 115*  --   --   --   --   --   BUN 10  --   < > 9 17 7   CREATININE 0.83  --   < > 1.0 0.8 1.0  CALCIUM 9.6  --   --   --   --   --   MG  --  2.2  --   --   --   --   < > = values in this interval not displayed.  Recent Labs  04/29/16 05/24/16 08/23/16  AST 24 23 40*  ALT 15 12 14   ALKPHOS 105 84 79    Recent Labs  01/26/16 1935  05/24/16 05/30/16 08/26/16  WBC 8.1  < > 13.2 11.8 6.3  NEUTROABS 5.7  --   --   --   --   HGB 11.8*  < > 14.2 13.9 11.5*  HCT 34.6*  < > 43 42 35*  MCV 96.4  --   --   --   --   PLT 286  < > 201 190 171  < > = values in this interval not displayed. No results for input(s): CHOL, LDLCALC, TRIG in the last 8760 hours.  Invalid input(s): HCL No results found for: [facility] Lab Results  Component Value Date   TSH 11.31 (A) 09/18/2016   No results found for: HGBA1C No results found for: CHOL, HDL, LDLCALC, LDLDIRECT, TRIG, CHOLHDL  Significant Diagnostic Results in last 30 days:  No results found.  Assessment and Plan  URI, probable-spoke  with daughter about my negative physical findings; told her we would watch vital signs her one more day since there was no documentation of fever; she probably has the cold it's been going around the nursing home recently me: Written for Robitussin 3 times a day scheduled for the next 7 days; we'll continue to monitor    Merrilee Seashore, MD

## 2016-10-03 ENCOUNTER — Encounter: Payer: Self-pay | Admitting: Internal Medicine

## 2016-10-22 ENCOUNTER — Non-Acute Institutional Stay (SKILLED_NURSING_FACILITY): Payer: Medicare Other | Admitting: Internal Medicine

## 2016-10-22 ENCOUNTER — Encounter: Payer: Self-pay | Admitting: Internal Medicine

## 2016-10-22 DIAGNOSIS — I10 Essential (primary) hypertension: Secondary | ICD-10-CM | POA: Diagnosis not present

## 2016-10-22 DIAGNOSIS — K2100 Gastro-esophageal reflux disease with esophagitis, without bleeding: Secondary | ICD-10-CM

## 2016-10-22 DIAGNOSIS — J449 Chronic obstructive pulmonary disease, unspecified: Secondary | ICD-10-CM

## 2016-10-22 NOTE — Progress Notes (Signed)
Location:  [facility] Room Number: 213D Place of Service:  SNF (31)  [name], MD  Patient Care Team: [name], MD as PCP - General (Internal Medicine)  Extended Emergency Contact Information Primary Emergency Contact: [name] [phone] [name] Home Phone: [phone] Relation: Niece Secondary Emergency Contact: Dorice Lamas [place] Home Phone: [phone] Relation: Niece    Allergies: Azithromycin; Cephalexin; Cephalosporins; Ciprofloxacin; Darvon [propoxyphene]; Flagyl [metronidazole]; Librax [chlordiazepoxide-clidinium]; Macrodantin [nitrofurantoin macrocrystal]; Nitrofuran derivatives; Other; Oxycodone-acetaminophen; Percocet [oxycodone-acetaminophen]; Quetiapine; Sulfa antibiotics; and Sulfamethoxazole  Chief Complaint  Patient presents with  . Medical Management of Chronic Issues    routine visit    HPI: Patient is 76 y.o. female who Is being seen for routine issues of hypertension, COPD, and GERD.  Past Medical History:  Diagnosis Date  . Acute encephalopathy 02/13/2016  . Anemia   . Anxiety   . Arthritis   . Asthma   . Barrett's esophagus   . Chronic kidney disease   . COPD (chronic obstructive pulmonary disease) (HCC)   . Dementia   . Dementia with behavioral disturbance 02/13/2016  . Depression   . Disease of thyroid gland   . Diverticulitis   . GERD (gastroesophageal reflux disease)   . Hyperlipemia   . Hypertension   . Memory difficulties   . Osteoporosis   . Pancreatitis   . Paroxysmal atrial fibrillation with RVR (HCC) 05/10/2016  . Psoriasis (a type of skin inflammation)   . Vertigo   . Vitamin D deficiency 08/31/2016    Past Surgical History:  Procedure Laterality Date  . BACK SURGERY    . brain shun    . CHOLECYSTECTOMY    . ROTATOR CUFF REPAIR    . SHOULDER SURGERY    . UPPER GI ENDOSCOPY  12/19/2015   [facility], Include esophagus,  stomach & duodenum or / Jejunum: Dx w/wo biopsys and dilatation; Surgeon : Leonia Reeves Rhoton MD    Allergies as of 10/22/2016      Reactions   Azithromycin Other (See Comments)   unknown   Cephalexin Other (See Comments)   unknown   Cephalosporins    Ciprofloxacin Other (See Comments)   unknown   Darvon [propoxyphene]    Flagyl [metronidazole] Other (See Comments)   unknown   Librax [chlordiazepoxide-clidinium] Other (See Comments)   unknown   Macrodantin [nitrofurantoin Macrocrystal]    Nitrofuran Derivatives Other (See Comments)   unknown   Other Other (See Comments)   unknown   Oxycodone-acetaminophen Other (See Comments)   unknown   Percocet [oxycodone-acetaminophen]    Quetiapine Other (See Comments)   unknown Potassium level dropped.   Sulfa Antibiotics    Sulfamethoxazole Other (See Comments)   unknown      Medication List       Accurate as of 10/22/16 11:59 PM. Always use your most recent med list.          acetaminophen 500 MG tablet Commonly known as:  TYLENOL Take 500 mg by mouth. Take 2 tablets twice daily for pain   acetaminophen 325 MG tablet Commonly known as:  TYLENOL Take 650 mg by mouth every 8 (eight) hours as needed.   albuterol 108 (90 Base) MCG/ACT inhaler Commonly known as:  PROVENTIL HFA;VENTOLIN HFA Inhale 2 puffs into the lungs every 6 (six) hours as needed for wheezing.   clonazePAM 1 MG tablet Commonly known as:  KLONOPIN Give 1 mg by mouth at bedtime   clonazePAM 0.5 MG tablet Commonly known as:  KLONOPIN Take 1 tablet (0.5 mg total) by mouth 2 (two) times daily as needed for anxiety.   D3-1000 1000 units tablet Generic drug:  Cholecalciferol Take 1,000 Units by mouth daily.   donepezil 10 MG tablet Commonly known as:  ARICEPT Take 10 mg by mouth at bedtime.   ENSURE CLEAR PO Take by mouth. Take twice daily between meal to support weight status   ipratropium-albuterol 0.5-2.5 (3) MG/3ML Soln Commonly known as:   DUONEB Take 3 mLs by nebulization every 4 (four) hours as needed.   levothyroxine 112 MCG tablet Commonly known as:  SYNTHROID, LEVOTHROID Take 112 mcg by mouth daily before breakfast.   modafinil 100 MG tablet Commonly known as:  PROVIGIL Take 100 mg by mouth. Take one tablet daily for 5 days   oxybutynin 5 MG tablet Commonly known as:  DITROPAN Take 5 mg by mouth 3 (three) times daily.   pantoprazole 40 MG tablet Commonly known as:  PROTONIX Take 40 mg by mouth 2 (two) times daily.   perphenazine 2 MG tablet Commonly known as:  TRILAFON Take 2 mg by mouth. Take one tablet nightly   ranitidine 75 MG tablet Commonly known as:  ZANTAC Take 75 mg by mouth at bedtime.   theophylline 300 MG 12 hr tablet Commonly known as:  THEODUR Take 300 mg by mouth every 12 (twelve) hours.   venlafaxine 37.5 MG tablet Commonly known as:  EFFEXOR Take 37.5 mg by mouth daily.       Meds ordered this encounter  Medications  . levothyroxine (SYNTHROID, LEVOTHROID) 112 MCG tablet    Sig: Take 112 mcg by mouth daily before breakfast.  . acetaminophen (TYLENOL) 500 MG tablet    Sig: Take 500 mg by mouth. Take 2 tablets twice daily for pain  . Nutritional Supplements (ENSURE CLEAR PO)    Sig: Take by mouth. Take twice daily between meal to support weight status    Immunization History  Administered Date(s) Administered  . Influenza, High Dose Seasonal PF 12/20/2014  . Influenza, Seasonal, Injecte, Preservative Fre 10/10/2014  . Influenza-Unspecified 12/07/2015  . PPD Test 02/10/2016, 02/26/2016  . Pneumococcal Conjugate-13 12/20/2014  . Td 08/26/2014    Social History  Substance Use Topics  . Smoking status: Former Smoker    Quit date: 12/17/1990  . Smokeless tobacco: Never Used  . Alcohol use No    Review of Systems  DATA OBTAINED: from patient-Limited participation; nurse-no concerns GENERAL:  no fevers, fatigue, appetite changes SKIN: No itching, rash HEENT: No  complaint RESPIRATORY: No cough, wheezing, SOB CARDIAC: No chest pain, palpitations, lower extremity edema  GI: No abdominal pain, No N/V/D or constipation, No heartburn or reflux  GU: No dysuria, frequency or urgency, or incontinence  MUSCULOSKELETAL: No unrelieved bone/joint pain NEUROLOGIC: No headache, dizziness  PSYCHIATRIC: No overt anxiety or sadness  Vitals:   10/22/16 1604  BP: 106/72  Pulse: 80  Resp: 20  Temp: (!) 97.1 F (36.2 C)   Body mass index is 27.62 kg/m. Physical Exam  GENERAL APPEARANCE: Alert, conversant, No acute distress  SKIN: No diaphoresis rash HEENT: Unremarkable RESPIRATORY: Breathing is even, unlabored. Lung sounds are clear   CARDIOVASCULAR: Heart RRR no murmurs, rubs or gallops. No peripheral edema  GASTROINTESTINAL: Abdomen is soft, non-tender, not distended w/ normal bowel sounds.  GENITOURINARY: Bladder non tender, not distended  MUSCULOSKELETAL: No abnormal joints or musculature  NEUROLOGIC: Cranial nerves 2-12 grossly intact. Moves all extremities PSYCHIATRIC: Mood and affect with dementia, no behavioral issues  Patient Active Problem List   Diagnosis Date Noted  . Hypotension 08/31/2016  . Postural dizziness with presyncope 08/31/2016  . Chest pain 08/31/2016  . Paroxysmal atrial fibrillation (HCC) 08/31/2016  . Hypokalemia 08/31/2016  . Depression 08/31/2016  . Vitamin D deficiency 08/31/2016  . Mood disorder (HCC) 08/22/2016  . PNA (pneumonia) 05/10/2016  . COPD exacerbation (HCC) 05/10/2016  . UTI (urinary tract infection) 05/10/2016  . Paroxysmal atrial fibrillation with RVR (HCC) 05/10/2016  . Metabolic encephalopathy 05/10/2016  . Elevated amylase and lipase 02/13/2016  . Acute encephalopathy 02/13/2016  . Psychosis (HCC) 02/13/2016  . Dementia with behavioral disturbance 02/13/2016  . Overactive bladder 02/13/2016  . Chronic kidney disease   . COPD (chronic obstructive pulmonary disease) (HCC)   . Anemia   . Anxiety     . Barrett's esophagus   . Hypothyroidism   . GERD (gastroesophageal reflux disease)   . Hypertension   . Memory difficulties   . Osteoporosis   . Psoriasis (a type of skin inflammation)     CMP     Component Value Date/Time   NA 139 08/28/2016   K 3.4 08/28/2016   CL 100 (L) 01/26/2016 1935   CO2 30 01/26/2016 1935   GLUCOSE 115 (H) 01/26/2016 1935   BUN 7 08/28/2016   CREATININE 1.0 08/28/2016   CREATININE 0.83 01/26/2016 1935   CALCIUM 9.6 01/26/2016 1935   AST 40 (A) 08/23/2016   ALT 14 08/23/2016   ALKPHOS 79 08/23/2016   GFRNONAA >60 01/26/2016 1935   GFRAA >60 01/26/2016 1935    Recent Labs  01/26/16 1935 01/26/16 1940  05/09/16 05/24/16 08/28/16  NA 139  --   < > 142 140 139  K 2.3*  --   < > 4.7 4.3 3.4  CL 100*  --   --   --   --   --   CO2 30  --   --   --   --   --   GLUCOSE 115*  --   --   --   --   --   BUN 10  --   < > CREATININE 0.83  --   < > 1.0 0.8 1.0  CALCIUM 9.6  --   --   --   --   --   MG  --  2.2  --   --   --   --   < > = values in this interval not displayed.  Recent Labs  04/29/16 05/24/16 08/23/16  AST 24 23 40*  ALT ALKPHOS 105 84 79    Recent Labs  01/26/16 1935  05/24/16 05/30/16 08/26/16  WBC 8.1  < > 13.2 11.8 6.3  NEUTROABS 5.7  --   --   --   --   HGB 11.8*  < > 14.2 13.9 11.5*  HCT 34.6*  < > 43 42 35*  MCV 96.4  --   --   --   --   PLT 286  < > 201 190 171  < > = values in this interval not displayed. No results for input(s): CHOL, LDLCALC, TRIG in the last 8760 hours.  Invalid input(s): HCL No results found for: MICROALBUR Lab Results  Component Value Date   TSH 11.31 (A) 09/18/2016   No results found for: HGBA1C No results  found for: CHOL, HDL, LDLCALC, LDLDIRECT, TRIG, CHOLHDL  Significant Diagnostic Results in last 30 days:  No results found.  Assessment and Plan  Hypertension Controlled on no medications; will continue to monitor  COPD (chronic obstructive pulmonary disease)  (HCC) No exacerbations; control Theodur 300 mg by mouth every 12; plan to continue when necessary albuterol and DuoNeb's  GERD (gastroesophageal reflux disease) Reports of reflux; plan to continue Protonix 40 mg by mouth twice a day and Zantac 75 mg by mouth daily at bedtime    Thurston Hole D. [name], MD

## 2016-11-19 ENCOUNTER — Encounter: Payer: Self-pay | Admitting: Internal Medicine

## 2016-11-19 ENCOUNTER — Non-Acute Institutional Stay (SKILLED_NURSING_FACILITY): Payer: Medicare Other | Admitting: Internal Medicine

## 2016-11-19 DIAGNOSIS — G301 Alzheimer's disease with late onset: Secondary | ICD-10-CM | POA: Diagnosis not present

## 2016-11-19 DIAGNOSIS — N3281 Overactive bladder: Secondary | ICD-10-CM

## 2016-11-19 DIAGNOSIS — F0281 Dementia in other diseases classified elsewhere with behavioral disturbance: Secondary | ICD-10-CM | POA: Diagnosis not present

## 2016-11-19 DIAGNOSIS — E034 Atrophy of thyroid (acquired): Secondary | ICD-10-CM

## 2016-11-19 NOTE — Progress Notes (Signed)
Location:  [facility] Room Number: 213D Place of Service:  SNF (31)  [name], MD  Patient Care Team: [name], MD as PCP - General (Internal Medicine)  Extended Emergency Contact Information Primary Emergency Contact: [name] [phone] [name] Home Phone: [phone] Relation: Niece Secondary Emergency Contact: Dorice Lamas [place] Home Phone: [phone] Relation: Niece    Allergies: Azithromycin; Cephalexin; Cephalosporins; Ciprofloxacin; Darvon [propoxyphene]; Flagyl [metronidazole]; Librax [chlordiazepoxide-clidinium]; Macrodantin [nitrofurantoin macrocrystal]; Nitrofuran derivatives; Other; Oxycodone-acetaminophen; Percocet [oxycodone-acetaminophen]; Quetiapine; Sulfa antibiotics; and Sulfamethoxazole  Chief Complaint  Patient presents with  . Medical Management of Chronic Issues    routine visit    HPI: Patient is 76 y.o. female who Is being seen for routine issues of hypothyroidism dementia and overactive bladder.  Past Medical History:  Diagnosis Date  . Acute encephalopathy 02/13/2016  . Anemia   . Anxiety   . Arthritis   . Asthma   . Barrett's esophagus   . Chronic kidney disease   . COPD (chronic obstructive pulmonary disease) (HCC)   . Dementia   . Dementia with behavioral disturbance 02/13/2016  . Depression   . Disease of thyroid gland   . Diverticulitis   . GERD (gastroesophageal reflux disease)   . Hyperlipemia   . Hypertension   . Memory difficulties   . Osteoporosis   . Pancreatitis   . Paroxysmal atrial fibrillation with RVR (HCC) 05/10/2016  . Psoriasis (a type of skin inflammation)   . Vertigo   . Vitamin D deficiency 08/31/2016    Past Surgical History:  Procedure Laterality Date  . BACK SURGERY    . brain shun    . CHOLECYSTECTOMY    . ROTATOR CUFF REPAIR    . SHOULDER SURGERY    . UPPER GI ENDOSCOPY  12/19/2015   [facility],  Include esophagus, stomach & duodenum or / Jejunum: Dx w/wo biopsys and dilatation; Surgeon : Leonia Reeves Rhoton MD    Allergies as of 11/19/2016      Reactions   Azithromycin Other (See Comments)   unknown   Cephalexin Other (See Comments)   unknown   Cephalosporins    Ciprofloxacin Other (See Comments)   unknown   Darvon [propoxyphene]    Flagyl [metronidazole] Other (See Comments)   unknown   Librax [chlordiazepoxide-clidinium] Other (See Comments)   unknown   Macrodantin [nitrofurantoin Macrocrystal]    Nitrofuran Derivatives Other (See Comments)   unknown   Other Other (See Comments)   unknown   Oxycodone-acetaminophen Other (See Comments)   unknown   Percocet [oxycodone-acetaminophen]    Quetiapine Other (See Comments)   unknown Potassium level dropped.   Sulfa Antibiotics    Sulfamethoxazole Other (See Comments)   unknown      Medication List       Accurate as of 11/19/16 11:59 PM. Always use your most recent med list.          acetaminophen 500 MG tablet Commonly known as:  TYLENOL Take 500 mg by mouth. Take 2 tablets twice daily for pain   acetaminophen 325 MG tablet Commonly known as:  TYLENOL Take 650 mg by mouth every 8 (eight) hours as needed.   albuterol 108 (90 Base) MCG/ACT inhaler Commonly known as:  PROVENTIL HFA;VENTOLIN HFA Inhale 2 puffs into the lungs every 6 (six) hours as needed for wheezing.   carbamide peroxide 6.5 % OTIC solution Commonly  known as:  DEBROX Place 5 drops into the left ear. Every other night to loosen excessive ear wax   clonazePAM 1 MG tablet Commonly known as:  KLONOPIN Give 1 mg by mouth at bedtime   D3-1000 1000 units tablet Generic drug:  Cholecalciferol Take 1,000 Units by mouth daily.   donepezil 10 MG tablet Commonly known as:  ARICEPT Take 10 mg by mouth at bedtime.   ENSURE CLEAR PO Take by mouth. Take twice daily between meal to support weight status   ipratropium-albuterol 0.5-2.5 (3) MG/3ML  Soln Commonly known as:  DUONEB Take 3 mLs by nebulization every 4 (four) hours as needed.   levothyroxine 112 MCG tablet Commonly known as:  SYNTHROID, LEVOTHROID Take 112 mcg by mouth daily before breakfast.   oxybutynin 5 MG tablet Commonly known as:  DITROPAN Take 5 mg by mouth 3 (three) times daily.   pantoprazole 40 MG tablet Commonly known as:  PROTONIX Take 40 mg by mouth 2 (two) times daily.   ranitidine 75 MG tablet Commonly known as:  ZANTAC Take 75 mg by mouth at bedtime.   theophylline 300 MG 12 hr tablet Commonly known as:  THEODUR Take 300 mg by mouth every 12 (twelve) hours.   venlafaxine 37.5 MG tablet Commonly known as:  EFFEXOR Take 37.5 mg by mouth daily.       Meds ordered this encounter  Medications  . carbamide peroxide (DEBROX) 6.5 % OTIC solution    Sig: Place 5 drops into the left ear. Every other night to loosen excessive ear wax    Immunization History  Administered Date(s) Administered  . Influenza, High Dose Seasonal PF 12/20/2014  . Influenza, Seasonal, Injecte, Preservative Fre 10/10/2014  . Influenza-Unspecified 12/07/2015  . PPD Test 02/10/2016, 02/26/2016  . Pneumococcal Conjugate-13 12/20/2014  . Td 08/26/2014    Social History  Substance Use Topics  . Smoking status: Former Smoker    Quit date: 12/17/1990  . Smokeless tobacco: Never Used  . Alcohol use No    Review of Systems  DATA OBTAINED: from patient-Limited participation;Nursing-no concerns GENERAL:  no fevers, fatigue, appetite changes SKIN: No itching, rash HEENT: No complaint RESPIRATORY: No cough, wheezing, SOB CARDIAC: No chest pain, palpitations, lower extremity edema  GI: No abdominal pain, No N/V/D or constipation, No heartburn or reflux  GU: No dysuria, frequency or urgency, or incontinence  MUSCULOSKELETAL: No unrelieved bone/joint pain NEUROLOGIC: No headache, dizziness  PSYCHIATRIC: No overt anxiety or sadness  Vitals:   11/19/16 1346  BP:  124/62  Pulse: 78  Resp: 20  Temp: (!) 97.5 F (36.4 C)   Body mass index is 27.42 kg/m. Physical Exam  GENERAL APPEARANCE: Alert, conversant, No acute distress  SKIN: No diaphoresis rash HEENT: Unremarkable RESPIRATORY: Breathing is even, unlabored. Lung sounds are clear   CARDIOVASCULAR: Heart RRR no murmurs, rubs or gallops. No peripheral edema  GASTROINTESTINAL: Abdomen is soft, non-tender, not distended w/ normal bowel sounds.  GENITOURINARY: Bladder non tender, not distended  MUSCULOSKELETAL: No abnormal joints or musculature NEUROLOGIC: Cranial nerves 2-12 grossly intact. Moves all extremities PSYCHIATRIC: Mood and affect with dementia, no behavioral issues  Patient Active Problem List   Diagnosis Date Noted  . Hypotension 08/31/2016  . Postural dizziness with presyncope 08/31/2016  . Chest pain 08/31/2016  . Paroxysmal atrial fibrillation (HCC) 08/31/2016  . Hypokalemia 08/31/2016  . Depression 08/31/2016  . Vitamin D deficiency 08/31/2016  . Mood disorder (HCC) 08/22/2016  . PNA (pneumonia) 05/10/2016  . COPD exacerbation (  HCC) 05/10/2016  . UTI (urinary tract infection) 05/10/2016  . Paroxysmal atrial fibrillation with RVR (HCC) 05/10/2016  . Metabolic encephalopathy 05/10/2016  . Elevated amylase and lipase 02/13/2016  . Acute encephalopathy 02/13/2016  . Psychosis (HCC) 02/13/2016  . Dementia with behavioral disturbance 02/13/2016  . Overactive bladder 02/13/2016  . Chronic kidney disease   . COPD (chronic obstructive pulmonary disease) (HCC)   . Anemia   . Anxiety   . Barrett's esophagus   . Hypothyroidism   . GERD (gastroesophageal reflux disease)   . Hypertension   . Memory difficulties   . Osteoporosis   . Psoriasis (a type of skin inflammation)     CMP     Component Value Date/Time   NA 139 08/28/2016   K 3.4 08/28/2016   CL 100 (L) 01/26/2016 1935   CO2 30 01/26/2016 1935   GLUCOSE 115 (H) 01/26/2016 1935   BUN 7 08/28/2016   CREATININE  1.0 08/28/2016   CREATININE 0.83 01/26/2016 1935   CALCIUM 9.6 01/26/2016 1935   AST 40 (A) 08/23/2016   ALT 14 08/23/2016   ALKPHOS 79 08/23/2016   GFRNONAA >60 01/26/2016 1935   GFRAA >60 01/26/2016 1935    Recent Labs  01/26/16 1935 01/26/16 1940  05/09/16 05/24/16 08/28/16  NA 139  --   < > 142 140 139  K 2.3*  --   < > 4.7 4.3 3.4  CL 100*  --   --   --   --   --   CO2 30  --   --   --   --   --   GLUCOSE 115*  --   --   --   --   --   BUN 10  --   < > CREATININE 0.83  --   < > 1.0 0.8 1.0  CALCIUM 9.6  --   --   --   --   --   MG  --  2.2  --   --   --   --   < > = values in this interval not displayed.  Recent Labs  04/29/16 05/24/16 08/23/16  AST 24 23 40*  ALT ALKPHOS 105 84 79    Recent Labs  01/26/16 1935  05/24/16 05/30/16 08/26/16  WBC 8.1  < > 13.2 11.8 6.3  NEUTROABS 5.7  --   --   --   --   HGB 11.8*  < > 14.2 13.9 11.5*  HCT 34.6*  < > 43 42 35*  MCV 96.4  --   --   --   --   PLT 286  < > 201 190 171  < > = values in this interval not displayed. No results for input(s): CHOL, LDLCALC, TRIG in the last 8760 hours.  Invalid input(s): HCL No results found for: MICROALBUR Lab Results  Component Value Date   TSH 11.31 (A) 09/18/2016   No results found for: HGBA1C No results found for: CHOL, HDL, LDLCALC, LDLDIRECT, TRIG, CHOLHDL  Significant Diagnostic Results in last 30 days:  No results found.  Assessment and Plan  Hypothyroidism TSH is 11; we will increase levothyroxine from 112 g daily to 125 g daily and repeat in one month  Dementia with behavioral disturbance Stable and chronic; continue Aricept 10 mg by mouth daily  Overactive bladder Stable; no recent infections; plan to continue Ditropan 5 mg by mouth 3 times a day  Noah Delaine. Sheppard Coil, MD

## 2016-11-21 ENCOUNTER — Encounter: Payer: Self-pay | Admitting: Internal Medicine

## 2016-11-21 NOTE — Assessment & Plan Note (Signed)
Stable; no recent infections; plan to continue Ditropan 5 mg by mouth 3 times a day

## 2016-11-21 NOTE — Assessment & Plan Note (Signed)
TSH is 11; we will increase levothyroxine from 112 g daily to 125 g daily and repeat in one month

## 2016-11-21 NOTE — Assessment & Plan Note (Signed)
No exacerbations; control Theodur 300 mg by mouth every 12; plan to continue when necessary albuterol and DuoNeb's

## 2016-11-21 NOTE — Assessment & Plan Note (Signed)
Controlled on no medications; will continue to monitor

## 2016-11-21 NOTE — Assessment & Plan Note (Signed)
Reports of reflux; plan to continue Protonix 40 mg by mouth twice a day and Zantac 75 mg by mouth daily at bedtime

## 2016-11-21 NOTE — Assessment & Plan Note (Signed)
Stable and chronic; continue Aricept 10 mg by mouth daily

## 2016-12-23 ENCOUNTER — Non-Acute Institutional Stay (SKILLED_NURSING_FACILITY): Payer: Medicare Other | Admitting: Internal Medicine

## 2016-12-23 ENCOUNTER — Encounter: Payer: Self-pay | Admitting: Internal Medicine

## 2016-12-23 DIAGNOSIS — J40 Bronchitis, not specified as acute or chronic: Secondary | ICD-10-CM | POA: Diagnosis not present

## 2016-12-23 DIAGNOSIS — R0989 Other specified symptoms and signs involving the circulatory and respiratory systems: Secondary | ICD-10-CM

## 2016-12-23 NOTE — Progress Notes (Signed)
[facility] Room Number: 213D Place of Service:  SNF (31)  Margit HanksAlexander, Kaysha Parsell D, MD  Patient Care Team: Margit HanksAlexander, Jamonica Schoff D, MD as PCP - General (Internal Medicine)  Extended Emergency Contact Information Primary Emergency Contact: Docia BarrierHopkins,Tammy          [name] [phone] [place] Home Phone: [phone] Relation: Niece Secondary Emergency Contact: [name],Donna  [place] Home Phone: [phone] Relation: Niece    Allergies: Azithromycin; Cephalexin; Cephalosporins; Ciprofloxacin; Darvon [propoxyphene]; Flagyl [metronidazole]; Librax [chlordiazepoxide-clidinium]; Macrodantin [nitrofurantoin macrocrystal]; Nitrofuran derivatives; Other; Oxycodone-acetaminophen; Percocet [oxycodone-acetaminophen]; Quetiapine; Sulfa antibiotics; and Sulfamethoxazole  Chief Complaint  Patient presents with  . Acute Visit    painful cough, congestion    HPI: Patient is 76 y.o. female who is being seen today for a painful cough with congestion. Patient had a little bit of upper respiratory congestion for 5 days and  on Friday and was started on Mucinex. Today she is worse. Her roommate says she coughs all night, patient says she does cough up dark sputum and she does have central chest pain with her cough. Her O2 saturation on room air is 95%. Nothing makes it better and nothing makes it worse.   Past Medical History:  Diagnosis Date  . Acute encephalopathy 02/13/2016  . Anemia   . Anxiety   . Arthritis   . Asthma   . Barrett's esophagus   . Chronic kidney disease   . COPD (chronic obstructive pulmonary disease) (HCC)   . Dementia   . Dementia with behavioral disturbance 02/13/2016  . Depression   . Disease of thyroid gland   . Diverticulitis   . GERD (gastroesophageal reflux disease)   . Hyperlipemia   . Hypertension   . Memory difficulties   . Osteoporosis   . Pancreatitis   . Paroxysmal atrial fibrillation with RVR (HCC)  05/10/2016  . Psoriasis (a type of skin inflammation)   . Vertigo   . Vitamin D deficiency 08/31/2016    Past Surgical History:  Procedure Laterality Date  . BACK SURGERY    . brain shun    . CHOLECYSTECTOMY    . ROTATOR CUFF REPAIR    . SHOULDER SURGERY    . UPPER GI ENDOSCOPY  12/19/2015   [facility], Include esophagus, stomach & duodenum or / Jejunum: Dx w/wo biopsys and dilatation; Surgeon : Leonia ReevesAlbert John Rhoton MD    Allergies as of 12/23/2016      Reactions   Azithromycin Other (See Comments)   unknown   Cephalexin Other (See Comments)   unknown   Cephalosporins    Ciprofloxacin Other (See Comments)   unknown   Darvon [propoxyphene]    Flagyl [metronidazole] Other (See Comments)   unknown   Librax [chlordiazepoxide-clidinium] Other (See Comments)   unknown   Macrodantin [nitrofurantoin Macrocrystal]    Nitrofuran Derivatives Other (See Comments)   unknown   Other Other (See Comments)   unknown   Oxycodone-acetaminophen Other (See Comments)   unknown   Percocet [oxycodone-acetaminophen]    Quetiapine Other (See Comments)   unknown Potassium level dropped.   Sulfa Antibiotics    Sulfamethoxazole Other (See Comments)   unknown      Medication List        Accurate as of 12/23/16 12:14 PM. Always use your most recent med list.          acetaminophen 500 MG tablet Commonly known as:  TYLENOL Take 500 mg by mouth. Take 2  tablets twice daily for pain   acetaminophen 325 MG tablet Commonly known as:  TYLENOL Take 650 mg by mouth every 8 (eight) hours as needed.   albuterol 108 (90 Base) MCG/ACT inhaler Commonly known as:  PROVENTIL HFA;VENTOLIN HFA Inhale 2 puffs into the lungs every 6 (six) hours as needed for wheezing.   carbamide peroxide 6.5 % OTIC solution Commonly known as:  DEBROX Place 5 drops into the left ear. Every other night to loosen excessive ear wax   clonazePAM 1 MG tablet Commonly known as:  KLONOPIN Give 1 mg by mouth at bedtime     D3-1000 1000 units tablet Generic drug:  Cholecalciferol Take 1,000 Units by mouth daily.   donepezil 10 MG tablet Commonly known as:  ARICEPT Take 10 mg by mouth at bedtime.   ENSURE CLEAR PO Take by mouth. Take  daily  to support weight status   guaiFENesin 600 MG 12 hr tablet Commonly known as:  MUCINEX Take 2 (two) times daily by mouth. For one cough/congestion for one week   ipratropium-albuterol 0.5-2.5 (3) MG/3ML Soln Commonly known as:  DUONEB Take 3 mLs by nebulization every 4 (four) hours as needed.   levothyroxine 125 MCG tablet Commonly known as:  SYNTHROID, LEVOTHROID Take 125 mcg daily before breakfast by mouth.   oxybutynin 5 MG tablet Commonly known as:  DITROPAN Take 5 mg by mouth 3 (three) times daily.   pantoprazole 40 MG tablet Commonly known as:  PROTONIX Take 40 mg by mouth 2 (two) times daily.   ranitidine 75 MG tablet Commonly known as:  ZANTAC Take 75 mg by mouth at bedtime.   sodium chloride 0.65 % Soln nasal spray Commonly known as:  OCEAN Place 1 spray into both nostrils. Apply 2 sprays to each nostrils twice daily as needed for dry nasal membrane   theophylline 100 MG 12 hr tablet Commonly known as:  THEODUR Take 100 mg by mouth. Take one tablet twice daily for COPD   venlafaxine 37.5 MG tablet Commonly known as:  EFFEXOR Take 37.5 mg by mouth daily.       Meds ordered this encounter  Medications  . guaiFENesin (MUCINEX) 600 MG 12 hr tablet    Sig: Take 2 (two) times daily by mouth. For one cough/congestion for one week  . levothyroxine (SYNTHROID, LEVOTHROID) 125 MCG tablet    Sig: Take 125 mcg daily before breakfast by mouth.  . sodium chloride (OCEAN) 0.65 % SOLN nasal spray    Sig: Place 1 spray into both nostrils. Apply 2 sprays to each nostrils twice daily as needed for dry nasal membrane  . theophylline (THEODUR) 100 MG 12 hr tablet    Sig: Take 100 mg by mouth. Take one tablet twice daily for COPD    Immunization  History  Administered Date(s) Administered  . Influenza, High Dose Seasonal PF 12/20/2014  . Influenza, Seasonal, Injecte, Preservative Fre 10/10/2014  . Influenza-Unspecified 12/07/2015, 11/21/2016  . PPD Test 02/10/2016, 02/26/2016  . Pneumococcal Conjugate-13 12/20/2014  . Td 08/26/2014    Social History   Tobacco Use  . Smoking status: Former Smoker    Last attempt to quit: 12/17/1990    Years since quitting: 26.0  . Smokeless tobacco: Never Used  Substance Use Topics  . Alcohol use: No    Review of Systems  DATA OBTAINED: from patient, nurse GENERAL:  no fevers,+ fatigue, appetite changes SKIN: No itching, rash HEENT: No complaint RESPIRATORY: + cough, no wheezing, no SOB CARDIAC: No  chest pain, palpitations, lower extremity edema  GI: No abdominal pain, No N/V/D or constipation, No heartburn or reflux  GU: No dysuria, frequency or urgency, or incontinence  MUSCULOSKELETAL: No unrelieved bone/joint pain NEUROLOGIC: No headache, dizziness  PSYCHIATRIC: No overt anxiety or sadness  Vitals:   12/23/16 1158  BP: 102/68  Pulse: 81  Resp: 18  Temp: 99.8 F (37.7 C)  SpO2: 95%   Body mass index is 26.93 kg/m. Physical Exam  GENERAL APPEARANCE: Alert, conversant, No acute distress; looks sick ,not toxic SKIN: No diaphoresis rash HEENT: Unremarkable RESPIRATORY: Breathing is even, unlabored. Lung sounds are increased breath sounds right base, no radials, no wheezing; O2 saturation at bedside room air 95%   CARDIOVASCULAR: Heart RRR no murmurs, rubs or gallops. No peripheral edema  GASTROINTESTINAL: Abdomen is soft, non-tender, not distended w/ normal bowel sounds.  GENITOURINARY: Bladder non tender, not distended  MUSCULOSKELETAL: No abnormal joints or musculature NEUROLOGIC: Cranial nerves 2-12 grossly intact. Moves all extremities PSYCHIATRIC: Mood and affect appropriate to situation, no behavioral issues  Patient Active Problem List   Diagnosis Date Noted  .  Hypotension 08/31/2016  . Postural dizziness with presyncope 08/31/2016  . Chest pain 08/31/2016  . Paroxysmal atrial fibrillation (HCC) 08/31/2016  . Hypokalemia 08/31/2016  . Depression 08/31/2016  . Vitamin D deficiency 08/31/2016  . Mood disorder (HCC) 08/22/2016  . PNA (pneumonia) 05/10/2016  . COPD exacerbation (HCC) 05/10/2016  . UTI (urinary tract infection) 05/10/2016  . Paroxysmal atrial fibrillation with RVR (HCC) 05/10/2016  . Metabolic encephalopathy 05/10/2016  . Elevated amylase and lipase 02/13/2016  . Acute encephalopathy 02/13/2016  . Psychosis (HCC) 02/13/2016  . Dementia with behavioral disturbance 02/13/2016  . Overactive bladder 02/13/2016  . Chronic kidney disease   . COPD (chronic obstructive pulmonary disease) (HCC)   . Anemia   . Anxiety   . Barrett's esophagus   . Hypothyroidism   . GERD (gastroesophageal reflux disease)   . Hypertension   . Memory difficulties   . Osteoporosis   . Psoriasis (a type of skin inflammation)     CMP     Component Value Date/Time   NA 139 08/28/2016   K 3.4 08/28/2016   CL 100 (L) 01/26/2016 1935   CO2 30 01/26/2016 1935   GLUCOSE 115 (H) 01/26/2016 1935   BUN 7 08/28/2016   CREATININE 1.0 08/28/2016   CREATININE 0.83 01/26/2016 1935   CALCIUM 9.6 01/26/2016 1935   AST 40 (A) 08/23/2016   ALT 14 08/23/2016   ALKPHOS 79 08/23/2016   GFRNONAA >60 01/26/2016 1935   GFRAA >60 01/26/2016 1935   Recent Labs    01/26/16 1935 01/26/16 1940  05/09/16 05/24/16 08/28/16  NA 139  --    < > 142 140 139  K 2.3*  --    < > 4.7 4.3 3.4  CL 100*  --   --   --   --   --   CO2 30  --   --   --   --   --   GLUCOSE 115*  --   --   --   --   --   BUN 10  --    < > 9 17 7   CREATININE 0.83  --    < > 1.0 0.8 1.0  CALCIUM 9.6  --   --   --   --   --   MG  --  2.2  --   --   --   --    < > =  values in this interval not displayed.   Recent Labs    04/29/16 05/24/16 08/23/16  AST 24 23 40*  ALT 15 12 14   ALKPHOS 105 84 79    Recent Labs    01/26/16 1935  05/24/16 05/30/16 08/26/16  WBC 8.1   < > 13.2 11.8 6.3  NEUTROABS 5.7  --   --   --   --   HGB 11.8*   < > 14.2 13.9 11.5*  HCT 34.6*   < > 43 42 35*  MCV 96.4  --   --   --   --   PLT 286   < > 201 190 171   < > = values in this interval not displayed.   No results for input(s): CHOL, LDLCALC, TRIG in the last 8760 hours.  Invalid input(s): HCL No results found for: MICROALBUR Lab Results  Component Value Date   TSH 11.31 (A) 09/18/2016   No results found for: HGBA1C No results found for: CHOL, HDL, LDLCALC, LDLDIRECT, TRIG, CHOLHDL  Significant Diagnostic Results in last 30 days:  No results found.  Assessment and Plan  LOWER RESPIRATORY SYMPTOMS-I will order chest x-ray PA and lateral; have ordered Robitussin-DM 3 teaspoons 3 times a day and albuterol nebs 3 times a day, both for 7 days. I think patient has bronchitis. I would be tempted  to place her on an antibiotic since she has been sick for a week that she is allergic to everything will continue to monitor  Later entry - CXR - NAD   Jamian Andujo D. [name], MD

## 2016-12-26 ENCOUNTER — Other Ambulatory Visit: Payer: Self-pay

## 2016-12-26 LAB — TSH: TSH: 0.33 — AB (ref 0.41–5.90)

## 2017-01-07 ENCOUNTER — Non-Acute Institutional Stay (SKILLED_NURSING_FACILITY): Payer: Medicare Other | Admitting: Internal Medicine

## 2017-01-07 ENCOUNTER — Encounter: Payer: Self-pay | Admitting: Internal Medicine

## 2017-01-07 DIAGNOSIS — R05 Cough: Secondary | ICD-10-CM

## 2017-01-07 DIAGNOSIS — J40 Bronchitis, not specified as acute or chronic: Secondary | ICD-10-CM

## 2017-01-07 DIAGNOSIS — R059 Cough, unspecified: Secondary | ICD-10-CM

## 2017-01-07 NOTE — Progress Notes (Signed)
[facility] Room Number: 213D Place of Service:  SNF (31)  Margit HanksAlexander, Talik Casique D, MD  Patient Care Team: Margit HanksAlexander, Kamali Nephew D, MD as PCP - General (Internal Medicine)  Extended Emergency Contact Information Primary Emergency Contact: Docia BarrierHopkins,Tammy          [name] [phone] [place] Home Phone: [phone] Relation: Niece Secondary Emergency Contact: [name],Donna  [place] Home Phone: [phone] Relation: Niece    Allergies: Azithromycin; Cephalexin; Cephalosporins; Ciprofloxacin; Darvon [propoxyphene]; Flagyl [metronidazole]; Librax [chlordiazepoxide-clidinium]; Macrodantin [nitrofurantoin macrocrystal]; Nitrofuran derivatives; Other; Oxycodone-acetaminophen; Percocet [oxycodone-acetaminophen]; Quetiapine; Sulfa antibiotics; and Sulfamethoxazole  Chief Complaint  Patient presents with  . Acute Visit    cough    HPI: Patient is 76 y.o. female who is being seen acutely for still having a cough. Patient is coughing up productive sputum. Patient was seen 2 weeks ago for cough with a negative x-ray and was treated with cough syrup and nebs. I see patient in the hall for the last 3 days and I have not heard her cough once. Per nursing no change in mental status, no fever no change in diet.  Past Medical History:  Diagnosis Date  . Acute encephalopathy 02/13/2016  . Anemia   . Anxiety   . Arthritis   . Asthma   . Barrett's esophagus   . Chronic kidney disease   . COPD (chronic obstructive pulmonary disease) (HCC)   . Dementia   . Dementia with behavioral disturbance 02/13/2016  . Depression   . Disease of thyroid gland   . Diverticulitis   . GERD (gastroesophageal reflux disease)   . Hyperlipemia   . Hypertension   . Memory difficulties   . Osteoporosis   . Pancreatitis   . Paroxysmal atrial fibrillation with RVR (HCC) 05/10/2016  . Psoriasis (a type of skin inflammation)   . Vertigo   . Vitamin D  deficiency 08/31/2016    Past Surgical History:  Procedure Laterality Date  . BACK SURGERY    . brain shun    . CHOLECYSTECTOMY    . ROTATOR CUFF REPAIR    . SHOULDER SURGERY    . UPPER GI ENDOSCOPY  12/19/2015   [facility], Include esophagus, stomach & duodenum or / Jejunum: Dx w/wo biopsys and dilatation; Surgeon : Leonia ReevesAlbert John Rhoton MD    Allergies as of 01/07/2017      Reactions   Azithromycin Other (See Comments)   unknown   Cephalexin Other (See Comments)   unknown   Cephalosporins    Ciprofloxacin Other (See Comments)   unknown   Darvon [propoxyphene]    Flagyl [metronidazole] Other (See Comments)   unknown   Librax [chlordiazepoxide-clidinium] Other (See Comments)   unknown   Macrodantin [nitrofurantoin Macrocrystal]    Nitrofuran Derivatives Other (See Comments)   unknown   Other Other (See Comments)   unknown   Oxycodone-acetaminophen Other (See Comments)   unknown   Percocet [oxycodone-acetaminophen]    Quetiapine Other (See Comments)   unknown Potassium level dropped.   Sulfa Antibiotics    Sulfamethoxazole Other (See Comments)   unknown      Medication List        Accurate as of 01/07/17 12:02 PM. Always use your most recent med list.          acetaminophen 500 MG tablet Commonly known as:  TYLENOL Take 500 mg by mouth. Take 2 tablets twice daily for pain   acetaminophen 325 MG tablet Commonly known  as:  TYLENOL Take 650 mg by mouth every 8 (eight) hours as needed.   albuterol 108 (90 Base) MCG/ACT inhaler Commonly known as:  PROVENTIL HFA;VENTOLIN HFA Inhale 2 puffs into the lungs every 6 (six) hours as needed for wheezing.   carbamide peroxide 6.5 % OTIC solution Commonly known as:  DEBROX Place 5 drops into the left ear. Every other night to loosen excessive ear wax   clonazePAM 1 MG tablet Commonly known as:  KLONOPIN Give 1 mg by mouth at bedtime   D3-1000 1000 units tablet Generic drug:  Cholecalciferol Take 1,000 Units by  mouth daily.   donepezil 10 MG tablet Commonly known as:  ARICEPT Take 10 mg by mouth at bedtime.   ENSURE CLEAR PO Take by mouth. Take  daily  to support weight status   guaiFENesin 600 MG 12 hr tablet Commonly known as:  MUCINEX Take 2 (two) times daily by mouth. For one cough/congestion for one week   guaiFENesin 100 MG/5ML Soln Commonly known as:  ROBITUSSIN Take 5 mLs by mouth. 10 cc every 6 hours routinely for 3 days for cough. Stop 01/09/17   ipratropium-albuterol 0.5-2.5 (3) MG/3ML Soln Commonly known as:  DUONEB Take 3 mLs by nebulization every 4 (four) hours as needed.   ipratropium-albuterol 0.5-2.5 (3) MG/3ML Soln Commonly known as:  DUONEB Take 3 mLs by nebulization. Inhale 3 ml via nebulzer every 4 hours as neede   levothyroxine 112 MCG tablet Commonly known as:  SYNTHROID, LEVOTHROID Take 112 mcg by mouth daily before breakfast.   oxybutynin 5 MG tablet Commonly known as:  DITROPAN Take 5 mg by mouth 3 (three) times daily.   pantoprazole 40 MG tablet Commonly known as:  PROTONIX Take 40 mg by mouth 2 (two) times daily.   ranitidine 75 MG tablet Commonly known as:  ZANTAC Take 75 mg by mouth at bedtime.   ROBAFEN DM CLEAR 10-100 MG/5ML liquid Generic drug:  Dextromethorphan-Guaifenesin Take 5 mLs by mouth. Take 15 ml twice daily and at bedtime for 7 days. Stop 12/30/16   sodium chloride 0.65 % Soln nasal spray Commonly known as:  OCEAN Place 1 spray into both nostrils. Apply 2 sprays to each nostrils twice daily as needed for dry nasal membrane   theophylline 100 MG 12 hr tablet Commonly known as:  THEODUR Take 100 mg by mouth. Take one tablet twice daily for COPD   venlafaxine 37.5 MG tablet Commonly known as:  EFFEXOR Take 37.5 mg by mouth daily.       No orders of the defined types were placed in this encounter.   Immunization History  Administered Date(s) Administered  . Influenza, High Dose Seasonal PF 12/20/2014  . Influenza,  Seasonal, Injecte, Preservative Fre 10/10/2014  . Influenza-Unspecified 12/07/2015, 11/21/2016  . PPD Test 02/10/2016, 02/26/2016  . Pneumococcal Conjugate-13 12/20/2014  . Td 08/26/2014    Social History   Tobacco Use  . Smoking status: Former Smoker    Last attempt to quit: 12/17/1990    Years since quitting: 26.0  . Smokeless tobacco: Never Used  Substance Use Topics  . Alcohol use: No    Review of Systems  DATA OBTAINED: from patient, nurse GENERAL:  no fevers, fatigue, appetite changes SKIN: No itching, rash HEENT: No complaint RESPIRATORY: +no  cough, wheezing, no SOB CARDIAC: No chest pain, palpitations, lower extremity edema  GI: No abdominal pain, No N/V/D or constipation, No heartburn or reflux  GU: No dysuria, frequency or urgency, or incontinence  MUSCULOSKELETAL: No unrelieved bone/joint pain NEUROLOGIC: No headache, dizziness  PSYCHIATRIC: No overt anxiety or sadness  Vitals:   01/07/17 1123  BP: 126/78  Pulse: 76  Resp: 20  Temp: (!) 97.5 F (36.4 C)  SpO2: 98%   Body mass index is 26.93 kg/m. Physical Exam  GENERAL APPEARANCE: Alert, conversant, No acute distress ; dressed nicely sitting in hall in [facility] SKIN: No diaphoresis rash HEENT: Unremarkable RESPIRATORY: Breathing is even, unlabored. Lung sounds are slightly coarse; room air O2 saturation 99%   CARDIOVASCULAR: Heart RRR no murmurs, rubs or gallops. No peripheral edema  GASTROINTESTINAL: Abdomen is soft, non-tender, not distended w/ normal bowel sounds.  GENITOURINARY: Bladder non tender, not distended  MUSCULOSKELETAL: No abnormal joints or musculature NEUROLOGIC: Cranial nerves 2-12 grossly intact. Moves all extremities PSYCHIATRIC: Mood and affect appropriate to situation, no behavioral issues  Patient Active Problem List   Diagnosis Date Noted  . Hypotension 08/31/2016  . Postural dizziness with presyncope 08/31/2016  . Chest pain 08/31/2016  . Paroxysmal atrial fibrillation (HCC)  08/31/2016  . Hypokalemia 08/31/2016  . Depression 08/31/2016  . Vitamin D deficiency 08/31/2016  . Mood disorder (HCC) 08/22/2016  . PNA (pneumonia) 05/10/2016  . COPD exacerbation (HCC) 05/10/2016  . UTI (urinary tract infection) 05/10/2016  . Paroxysmal atrial fibrillation with RVR (HCC) 05/10/2016  . Metabolic encephalopathy 05/10/2016  . Elevated amylase and lipase 02/13/2016  . Acute encephalopathy 02/13/2016  . Psychosis (HCC) 02/13/2016  . Dementia with behavioral disturbance 02/13/2016  . Overactive bladder 02/13/2016  . Chronic kidney disease   . COPD (chronic obstructive pulmonary disease) (HCC)   . Anemia   . Anxiety   . Barrett's esophagus   . Hypothyroidism   . GERD (gastroesophageal reflux disease)   . Hypertension   . Memory difficulties   . Osteoporosis   . Psoriasis (a type of skin inflammation)     CMP     Component Value Date/Time   NA 139 08/28/2016   K 3.4 08/28/2016   CL 100 (L) 01/26/2016 1935   CO2 30 01/26/2016 1935   GLUCOSE 115 (H) 01/26/2016 1935   BUN 7 08/28/2016   CREATININE 1.0 08/28/2016   CREATININE 0.83 01/26/2016 1935   CALCIUM 9.6 01/26/2016 1935   AST 40 (A) 08/23/2016   ALT 14 08/23/2016   ALKPHOS 79 08/23/2016   GFRNONAA >60 01/26/2016 1935   GFRAA >60 01/26/2016 1935   Recent Labs    01/26/16 1935 01/26/16 1940  05/09/16 05/24/16 08/28/16  NA 139  --    < > 142 140 139  K 2.3*  --    < > 4.7 4.3 3.4  CL 100*  --   --   --   --   --   CO2 30  --   --   --   --   --   GLUCOSE 115*  --   --   --   --   --   BUN 10  --    < > 9 17 7   CREATININE 0.83  --    < > 1.0 0.8 1.0  CALCIUM 9.6  --   --   --   --   --   MG  --  2.2  --   --   --   --    < > = values in this interval not displayed.   Recent Labs    04/29/16 05/24/16 08/23/16  AST 24 23 40*  ALT 15 12  14  ALKPHOS 105 84 79   Recent Labs    01/26/16 1935  05/24/16 05/30/16 08/26/16  WBC 8.1   < > 13.2 11.8 6.3  NEUTROABS 5.7  --   --   --   --   HGB  11.8*   < > 14.2 13.9 11.5*  HCT 34.6*   < > 43 42 35*  MCV 96.4  --   --   --   --   PLT 286   < > 201 190 171   < > = values in this interval not displayed.   No results for input(s): CHOL, LDLCALC, TRIG in the last 8760 hours.  Invalid input(s): HCL No results found for: MICROALBUR Lab Results  Component Value Date   TSH 0.33 (A) 12/26/2016   No results found for: HGBA1C No results found for: CHOL, HDL, LDLCALC, LDLDIRECT, TRIG, CHOLHDL  Significant Diagnostic Results in last 30 days:  No results found.  Assessment and Plan  BRONCHITIS-resolving; patient looks much better her oxygen saturation is better I think her cough is just left over; we will add cough syrup routinely for another week    Thurston Hole D. [name], MD

## 2017-01-09 ENCOUNTER — Non-Acute Institutional Stay (SKILLED_NURSING_FACILITY): Payer: Medicare Other | Admitting: Internal Medicine

## 2017-01-09 ENCOUNTER — Encounter: Payer: Self-pay | Admitting: Internal Medicine

## 2017-01-09 DIAGNOSIS — I10 Essential (primary) hypertension: Secondary | ICD-10-CM | POA: Diagnosis not present

## 2017-01-09 DIAGNOSIS — J449 Chronic obstructive pulmonary disease, unspecified: Secondary | ICD-10-CM

## 2017-01-09 DIAGNOSIS — K2100 Gastro-esophageal reflux disease with esophagitis, without bleeding: Secondary | ICD-10-CM

## 2017-01-09 NOTE — Progress Notes (Signed)
Location:  [facility] Room Number: 213D Place of Service:  SNF (31)  Rachel Hanks, MD  Patient Care Team: Rachel Hanks, MD as PCP - General (Internal Medicine)  Extended Emergency Contact Information Primary Emergency Contact: Rachel Dickerson [phone] Rachel Dickerson of [place] Home Phone: [phone] Relation: Niece Secondary Emergency Contact: Rachel Dickerson [place] Home Phone: [phone] Relation: Niece    Allergies: Azithromycin; Cephalexin; Cephalosporins; Ciprofloxacin; Darvon [propoxyphene]; Flagyl [metronidazole]; Librax [chlordiazepoxide-clidinium]; Macrodantin [nitrofurantoin macrocrystal]; Nitrofuran derivatives; Other; Oxycodone-acetaminophen; Percocet [oxycodone-acetaminophen]; Quetiapine; Sulfa antibiotics; and Sulfamethoxazole  Chief Complaint  Patient presents with  . Medical Management of Chronic Issues    routine visit    HPI: Patient is 76 y.o. female who is being seen for routine issues of hypertension, COPD, and GERD.  Past Medical History:  Diagnosis Date  . Acute encephalopathy 02/13/2016  . Anemia   . Anxiety   . Arthritis   . Asthma   . Barrett's esophagus   . Chronic kidney disease   . COPD (chronic obstructive pulmonary disease) (HCC)   . Dementia   . Dementia with behavioral disturbance 02/13/2016  . Depression   . Disease of thyroid gland   . Diverticulitis   . GERD (gastroesophageal reflux disease)   . Hyperlipemia   . Hypertension   . Memory difficulties   . Osteoporosis   . Pancreatitis   . Paroxysmal atrial fibrillation with RVR (HCC) 05/10/2016  . Psoriasis (a type of skin inflammation)   . Vertigo   . Vitamin D deficiency 08/31/2016    Past Surgical History:  Procedure Laterality Date  . BACK SURGERY    . brain shun    . CHOLECYSTECTOMY    . ROTATOR CUFF REPAIR    . SHOULDER SURGERY    . UPPER GI ENDOSCOPY  12/19/2015   [facility] End[facility], Include esophagus,  stomach & duodenum or / Jejunum: Dx w/wo biopsys and dilatation; Surgeon : Leonia Reeves Rhoton MD    Allergies as of 01/09/2017      Reactions   Azithromycin Other (See Comments)   unknown   Cephalexin Other (See Comments)   unknown   Cephalosporins    Ciprofloxacin Other (See Comments)   unknown   Darvon [propoxyphene]    Flagyl [metronidazole] Other (See Comments)   unknown   Librax [chlordiazepoxide-clidinium] Other (See Comments)   unknown   Macrodantin [nitrofurantoin Macrocrystal]    Nitrofuran Derivatives Other (See Comments)   unknown   Other Other (See Comments)   unknown   Oxycodone-acetaminophen Other (See Comments)   unknown   Percocet [oxycodone-acetaminophen]    Quetiapine Other (See Comments)   unknown Potassium level dropped.   Sulfa Antibiotics    Sulfamethoxazole Other (See Comments)   unknown      Medication List        Accurate as of 01/09/17 11:59 PM. Always use your most recent med list.          acetaminophen 500 MG tablet Commonly known as:  TYLENOL Take 500 mg by mouth. Take 2 tablets twice daily for pain   acetaminophen 325 MG tablet Commonly known as:  TYLENOL Take 650 mg by mouth every 8 (eight) hours as needed.   carbamide peroxide 6.5 % OTIC solution Commonly known as:  DEBROX Place 5 drops into the left ear. Every other night to loosen excessive ear wax   clonazePAM 1 MG tablet Commonly known as:  KLONOPIN Give 1 mg by mouth at bedtime   D3-1000 1000 units tablet Generic drug:  Cholecalciferol Take 1,000 Units by mouth daily.   donepezil 10 MG tablet Commonly known as:  ARICEPT Take 10 mg by mouth at bedtime.   ENSURE CLEAR PO Take by mouth. Take  daily  to support weight status   ipratropium-albuterol 0.5-2.5 (3) MG/3ML Soln Commonly known as:  DUONEB Take 3 mLs by nebulization every 4 (four) hours as needed.   levothyroxine 112 MCG tablet Commonly known as:  SYNTHROID, LEVOTHROID Take 112 mcg by mouth daily  before breakfast.   oxybutynin 5 MG tablet Commonly known as:  DITROPAN Take 5 mg by mouth 3 (three) times daily.   pantoprazole 40 MG tablet Commonly known as:  PROTONIX Take 40 mg by mouth 2 (two) times daily.   ranitidine 75 MG tablet Commonly known as:  ZANTAC Take 75 mg by mouth at bedtime.   sodium chloride 0.65 % Soln nasal spray Commonly known as:  OCEAN Place 1 spray into both nostrils. Apply 2 sprays to each nostrils twice daily as needed for dry nasal membrane   theophylline 100 MG 12 hr tablet Commonly known as:  THEODUR Take 100 mg by mouth. Take one tablet twice daily for COPD   venlafaxine 37.5 MG tablet Commonly known as:  EFFEXOR Take 37.5 mg by mouth daily.       No orders of the defined types were placed in this encounter.   Immunization History  Administered Date(s) Administered  . Influenza, High Dose Seasonal PF 12/20/2014  . Influenza, Seasonal, Injecte, Preservative Fre 10/10/2014  . Influenza-Unspecified 12/07/2015, 11/21/2016  . PPD Test 02/10/2016, 02/26/2016  . Pneumococcal Conjugate-13 12/20/2014  . Td 08/26/2014    Social History   Tobacco Use  . Smoking status: Former Smoker    Last attempt to quit: 12/17/1990    Years since quitting: 26.1  . Smokeless tobacco: Never Used  Substance Use Topics  . Alcohol use: No    Review of Systems  DATA OBTAINED: from patient, nurse GENERAL:  no fevers, fatigue, appetite changes SKIN: No itching, rash HEENT: No complaint RESPIRATORY: No cough, wheezing, SOB CARDIAC: No chest pain, palpitations, lower extremity edema  GI: No abdominal pain, No N/V/D or constipation, No heartburn or reflux  GU: No dysuria, frequency or urgency, or incontinence  MUSCULOSKELETAL: No unrelieved bone/joint pain NEUROLOGIC: No headache, dizziness  PSYCHIATRIC: No overt anxiety or sadness  Vitals:   01/09/17 1136  BP: 126/78  Pulse: 76  Resp: 20  Temp: (!) 97.5 F (36.4 C)  SpO2: 98%   Body mass index  is 26.93 kg/m. Physical Exam  GENERAL APPEARANCE: Alert, conversant, No acute distress  SKIN: No diaphoresis rash HEENT: Unremarkable RESPIRATORY: Breathing is even, unlabored. Lung sounds are clear   CARDIOVASCULAR: Heart RRR no murmurs, rubs or gallops. No peripheral edema  GASTROINTESTINAL: Abdomen is soft, non-tender, not distended w/ normal bowel sounds.  GENITOURINARY: Bladder non tender, not distended  MUSCULOSKELETAL: No abnormal joints or musculature NEUROLOGIC: Cranial nerves 2-12 grossly intact. Moves all extremities PSYCHIATRIC: Mood and affect appropriate to situation, no behavioral issues  Patient Active Problem List   Diagnosis Date Noted  . Hypotension 08/31/2016  . Postural dizziness with presyncope 08/31/2016  . Chest pain 08/31/2016  . Paroxysmal atrial fibrillation (HCC) 08/31/2016  . Hypokalemia 08/31/2016  . Depression 08/31/2016  . Vitamin D deficiency 08/31/2016  . Mood disorder (HCC) 08/22/2016  . PNA (pneumonia) 05/10/2016  . COPD exacerbation (  HCC) 05/10/2016  . UTI (urinary tract infection) 05/10/2016  . Paroxysmal atrial fibrillation with RVR (HCC) 05/10/2016  . Metabolic encephalopathy 05/10/2016  . Elevated amylase and lipase 02/13/2016  . Acute encephalopathy 02/13/2016  . Psychosis (HCC) 02/13/2016  . Dementia with behavioral disturbance 02/13/2016  . Overactive bladder 02/13/2016  . Chronic kidney disease   . COPD (chronic obstructive pulmonary disease) (HCC)   . Anemia   . Anxiety   . Barrett's esophagus   . Hypothyroidism   . GERD (gastroesophageal reflux disease)   . Hypertension   . Memory difficulties   . Osteoporosis   . Psoriasis (a type of skin inflammation)     CMP     Component Value Date/Time   NA 139 08/28/2016   K 3.4 08/28/2016   CL 100 (L) 01/26/2016 1935   CO2 30 01/26/2016 1935   GLUCOSE 115 (H) 01/26/2016 1935   BUN 7 08/28/2016   CREATININE 1.0 08/28/2016   CREATININE 0.83 01/26/2016 1935   CALCIUM 9.6  01/26/2016 1935   AST 40 (A) 08/23/2016   ALT 14 08/23/2016   ALKPHOS 79 08/23/2016   GFRNONAA >60 01/26/2016 1935   GFRAA >60 01/26/2016 1935   Recent Labs    05/09/16 05/24/16 08/28/16  NA 142 140 139  K 4.7 4.3 3.4  BUN 9 17 7   CREATININE 1.0 0.8 1.0   Recent Labs    04/29/16 05/24/16 08/23/16  AST 24 23 40*  ALT 15 12 14   ALKPHOS 105 84 79   Recent Labs    05/24/16 05/30/16 08/26/16  WBC 13.2 11.8 6.3  HGB 14.2 13.9 11.5*  HCT 43 42 35*  PLT 201 190 171   No results for input(s): CHOL, LDLCALC, TRIG in the last 8760 hours.  Invalid input(s): HCL No results found for: MICROALBUR Lab Results  Component Value Date   TSH 0.53 01/22/2017   No results found for: HGBA1C No results found for: CHOL, HDL, LDLCALC, LDLDIRECT, TRIG, CHOLHDL  Significant Diagnostic Results in last 30 days:  No results found.  Assessment and Plan  Hypertension BP is controlled on her medications; plan to monitor intervals  COPD (chronic obstructive pulmonary disease) (HCC) No known exacerbations; continue theophylline 100 mg by mouth twice a day with when necessary albuterol and DuoNeb  GERD (gastroesophageal reflux disease) No reports of reflux, stable; continue Zantac 75 mg by mouth daily at bedtime and Protonix 40 mg by mouth daily     Rachel Shams D. Lyn HollingsheadAlexander, MD

## 2017-01-10 ENCOUNTER — Encounter: Payer: Self-pay | Admitting: Internal Medicine

## 2017-01-22 LAB — TSH: TSH: 0.53 (ref 0.41–5.90)

## 2017-01-24 ENCOUNTER — Other Ambulatory Visit: Payer: Self-pay

## 2017-02-06 ENCOUNTER — Encounter: Payer: Self-pay | Admitting: Internal Medicine

## 2017-02-06 ENCOUNTER — Non-Acute Institutional Stay (SKILLED_NURSING_FACILITY): Payer: Medicare Other | Admitting: Internal Medicine

## 2017-02-06 DIAGNOSIS — F0281 Dementia in other diseases classified elsewhere with behavioral disturbance: Secondary | ICD-10-CM

## 2017-02-06 DIAGNOSIS — E034 Atrophy of thyroid (acquired): Secondary | ICD-10-CM

## 2017-02-06 DIAGNOSIS — N3281 Overactive bladder: Secondary | ICD-10-CM | POA: Diagnosis not present

## 2017-02-06 DIAGNOSIS — F02818 Dementia in other diseases classified elsewhere, unspecified severity, with other behavioral disturbance: Secondary | ICD-10-CM

## 2017-02-06 DIAGNOSIS — G301 Alzheimer's disease with late onset: Secondary | ICD-10-CM | POA: Diagnosis not present

## 2017-02-06 NOTE — Progress Notes (Signed)
Location:  [facility] Room Number: 213D Place of Service:  SNF (31)  Margit HanksAlexander, Antone Summons D, MD  Patient Care Team: Margit HanksAlexander, Mate Alegria D, MD as PCP - General (Internal Medicine)  Extended Emergency Contact Information Primary Emergency Contact: Docia BarrierHopkins,Tammy          [name] [phone] [place] Home Phone: [phone] Relation: Niece Secondary Emergency Contact: [name],Donna  [place] Home Phone: [phone] Relation: Niece    Allergies: Azithromycin; Cephalexin; Cephalosporins; Ciprofloxacin; Darvon [propoxyphene]; Flagyl [metronidazole]; Librax [chlordiazepoxide-clidinium]; Macrodantin [nitrofurantoin macrocrystal]; Nitrofuran derivatives; Other; Oxycodone-acetaminophen; Percocet [oxycodone-acetaminophen]; Quetiapine; Sulfa antibiotics; and Sulfamethoxazole  Chief Complaint  Patient presents with  . Medical Management of Chronic Issues    routine visit  . Health Maintenance    orders written to give patient Pneumonia vaccine 23    HPI: Patient is 76 y.o. female who is being seen for routine issues of hypothyroidism dementia and overactive bladder.  Past Medical History:  Diagnosis Date  . Acute encephalopathy 02/13/2016  . Anemia   . Anxiety   . Arthritis   . Asthma   . Barrett's esophagus   . Chronic kidney disease   . COPD (chronic obstructive pulmonary disease) (HCC)   . Dementia   . Dementia with behavioral disturbance 02/13/2016  . Depression   . Disease of thyroid gland   . Diverticulitis   . GERD (gastroesophageal reflux disease)   . Hyperlipemia   . Hypertension   . Memory difficulties   . Osteoporosis   . Pancreatitis   . Paroxysmal atrial fibrillation with RVR (HCC) 05/10/2016  . Psoriasis (a type of skin inflammation)   . Vertigo   . Vitamin D deficiency 08/31/2016    Past Surgical History:  Procedure Laterality Date  . BACK SURGERY    . brain shun    . CHOLECYSTECTOMY    . ROTATOR CUFF  REPAIR    . SHOULDER SURGERY    . UPPER GI ENDOSCOPY  12/19/2015   [facility] End[facility], Include esophagus, stomach & duodenum or / Jejunum: Dx w/wo biopsys and dilatation; Surgeon : Leonia ReevesAlbert John Rhoton MD    Allergies as of 02/06/2017      Reactions   Azithromycin Other (See Comments)   unknown   Cephalexin Other (See Comments)   unknown   Cephalosporins    Ciprofloxacin Other (See Comments)   unknown   Darvon [propoxyphene]    Flagyl [metronidazole] Other (See Comments)   unknown   Librax [chlordiazepoxide-clidinium] Other (See Comments)   unknown   Macrodantin [nitrofurantoin Macrocrystal]    Nitrofuran Derivatives Other (See Comments)   unknown   Other Other (See Comments)   unknown   Oxycodone-acetaminophen Other (See Comments)   unknown   Percocet [oxycodone-acetaminophen]    Quetiapine Other (See Comments)   unknown Potassium level dropped.   Sulfa Antibiotics    Sulfamethoxazole Other (See Comments)   unknown      Medication List        Accurate as of 02/06/17 11:59 PM. Always use your most recent med list.          acetaminophen 500 MG tablet Commonly known as:  TYLENOL Take 500 mg by mouth. Take 2 tablets twice daily for pain   acetaminophen 325 MG tablet Commonly known as:  TYLENOL Take 650 mg by mouth every 8 (eight) hours as needed.   albuterol 108 (90 Base) MCG/ACT inhaler Commonly known as:  PROVENTIL HFA;VENTOLIN HFA Inhale into the lungs. Inhale 2 puffs  every 6 hours as needed for wheezing   carbamide peroxide 6.5 % OTIC solution Commonly known as:  DEBROX Place 5 drops into the left ear. Every other night to loosen excessive ear wax   clonazePAM 1 MG tablet Commonly known as:  KLONOPIN Give 1 mg by mouth at bedtime   D3-1000 1000 units tablet Generic drug:  Cholecalciferol Take 1,000 Units by mouth daily.   donepezil 10 MG tablet Commonly known as:  ARICEPT Take 10 mg by mouth at bedtime.   guaiFENesin 100 MG/5ML liquid Commonly known as:   ROBITUSSIN Take 200 mg by mouth. Take 15cc at bedtime for coughing at night   ipratropium-albuterol 0.5-2.5 (3) MG/3ML Soln Commonly known as:  DUONEB Take 3 mLs by nebulization every 4 (four) hours as needed.   levothyroxine 112 MCG tablet Commonly known as:  SYNTHROID, LEVOTHROID Take 112 mcg by mouth daily before breakfast.   oxybutynin 5 MG tablet Commonly known as:  DITROPAN Take 5 mg by mouth 3 (three) times daily.   pantoprazole 40 MG tablet Commonly known as:  PROTONIX Take 40 mg by mouth 2 (two) times daily.   ranitidine 75 MG tablet Commonly known as:  ZANTAC Take 75 mg by mouth at bedtime.   sodium chloride 0.65 % Soln nasal spray Commonly known as:  OCEAN Place 1 spray into both nostrils. Apply 2 sprays to each nostrils twice daily as needed for dry nasal membrane   theophylline 100 MG 12 hr tablet Commonly known as:  THEODUR Take 100 mg by mouth. Take one tablet twice daily for COPD   venlafaxine 37.5 MG tablet Commonly known as:  EFFEXOR Take 37.5 mg by mouth daily.       No orders of the defined types were placed in this encounter.   Immunization History  Administered Date(s) Administered  . Influenza, High Dose Seasonal PF 12/20/2014  . Influenza, Seasonal, Injecte, Preservative Fre 10/10/2014  . Influenza-Unspecified 12/07/2015, 11/21/2016  . PPD Test 02/10/2016, 02/26/2016  . Pneumococcal Conjugate-13 12/20/2014  . Td 08/26/2014    Social History   Tobacco Use  . Smoking status: Former Smoker    Last attempt to quit: 12/17/1990    Years since quitting: 26.1  . Smokeless tobacco: Never Used  Substance Use Topics  . Alcohol use: No    Review of Systems  DATA OBTAINED: from patient, nurse GENERAL:  no fevers, fatigue, appetite changes SKIN: No itching, rash HEENT: No complaint RESPIRATORY: No cough, wheezing, SOB CARDIAC: No chest pain, palpitations, lower extremity edema  GI: No abdominal pain, No N/V/D or constipation, No heartburn  or reflux  GU: No dysuria, frequency or urgency, or incontinence  MUSCULOSKELETAL: No unrelieved bone/joint pain NEUROLOGIC: No headache, dizziness  PSYCHIATRIC: No overt anxiety or sadness  Vitals:   02/06/17 1535  BP: 105/69  Pulse: 66  Resp: 19  Temp: (!) 97.4 F (36.3 C)  SpO2: 97%   Body mass index is 26.57 kg/m. Physical Exam  GENERAL APPEARANCE: Alert, conversant, No acute distress  SKIN: No diaphoresis rash HEENT: Unremarkable RESPIRATORY: Breathing is even, unlabored. Lung sounds are clear   CARDIOVASCULAR: Heart RRR no murmurs, rubs or gallops. No peripheral edema  GASTROINTESTINAL: Abdomen is soft, non-tender, not distended w/ normal bowel sounds.  GENITOURINARY: Bladder non tender, not distended  MUSCULOSKELETAL: No abnormal joints or musculature NEUROLOGIC: Cranial nerves 2-12 grossly intact. Moves all extremities PSYCHIATRIC: Mood and affect appropriate to situation, no behavioral issues  Patient Active Problem List   Diagnosis Date  Noted  . Hypotension 08/31/2016  . Postural dizziness with presyncope 08/31/2016  . Chest pain 08/31/2016  . Paroxysmal atrial fibrillation (HCC) 08/31/2016  . Hypokalemia 08/31/2016  . Depression 08/31/2016  . Vitamin D deficiency 08/31/2016  . Mood disorder (HCC) 08/22/2016  . PNA (pneumonia) 05/10/2016  . COPD exacerbation (HCC) 05/10/2016  . UTI (urinary tract infection) 05/10/2016  . Paroxysmal atrial fibrillation with RVR (HCC) 05/10/2016  . Metabolic encephalopathy 05/10/2016  . Elevated amylase and lipase 02/13/2016  . Acute encephalopathy 02/13/2016  . Psychosis (HCC) 02/13/2016  . Dementia with behavioral disturbance 02/13/2016  . Overactive bladder 02/13/2016  . Chronic kidney disease   . COPD (chronic obstructive pulmonary disease) (HCC)   . Anemia   . Anxiety   . Barrett's esophagus   . Hypothyroidism   . GERD (gastroesophageal reflux disease)   . Hypertension   . Memory difficulties   . Osteoporosis     . Psoriasis (a type of skin inflammation)     CMP     Component Value Date/Time   NA 139 08/28/2016   K 3.4 08/28/2016   CL 100 (L) 01/26/2016 1935   CO2 30 01/26/2016 1935   GLUCOSE 115 (H) 01/26/2016 1935   BUN 7 08/28/2016   CREATININE 1.0 08/28/2016   CREATININE 0.83 01/26/2016 1935   CALCIUM 9.6 01/26/2016 1935   AST 40 (A) 08/23/2016   ALT 14 08/23/2016   ALKPHOS 79 08/23/2016   GFRNONAA >60 01/26/2016 1935   GFRAA >60 01/26/2016 1935   Recent Labs    05/09/16 05/24/16 08/28/16  NA 142 140 139  K 4.7 4.3 3.4  BUN 9 17 7   CREATININE 1.0 0.8 1.0   Recent Labs    04/29/16 05/24/16 08/23/16  AST 24 23 40*  ALT 15 12 14   ALKPHOS 105 84 79   Recent Labs    05/24/16 05/30/16 08/26/16  WBC 13.2 11.8 6.3  HGB 14.2 13.9 11.5*  HCT 43 42 35*  PLT 201 190 171   No results for input(s): CHOL, LDLCALC, TRIG in the last 8760 hours.  Invalid input(s): HCL No results found for: MICROALBUR Lab Results  Component Value Date   TSH 0.53 01/22/2017   No results found for: HGBA1C No results found for: CHOL, HDL, LDLCALC, LDLDIRECT, TRIG, CHOLHDL  Significant Diagnostic Results in last 30 days:  No results found.  Assessment and Plan  Hypothyroidism Most recent TSH 0.53; plan to continue Synthroid 112 micrograms daily  Dementia with behavioral disturbance Stable and mild; continue Aricept 10 mg by mouth daily  Overactive bladder No recent infections; continue Ditropan 5 mg by mouth 3 times a day    Angelize Ryce D. Lyn HollingsheadAlexander, MD

## 2017-02-09 ENCOUNTER — Encounter: Payer: Self-pay | Admitting: Internal Medicine

## 2017-02-09 NOTE — Assessment & Plan Note (Signed)
No known exacerbations; continue theophylline 100 mg by mouth twice a day with when necessary albuterol and DuoNeb

## 2017-02-09 NOTE — Assessment & Plan Note (Signed)
No recent infections; continue Ditropan 5 mg by mouth 3 times a day

## 2017-02-09 NOTE — Assessment & Plan Note (Signed)
Stable and mild; continue Aricept 10 mg by mouth daily

## 2017-02-09 NOTE — Assessment & Plan Note (Signed)
BP is controlled on her medications; plan to monitor intervals

## 2017-02-09 NOTE — Assessment & Plan Note (Signed)
No reports of reflux, stable; continue Zantac 75 mg by mouth daily at bedtime and Protonix 40 mg by mouth daily

## 2017-02-09 NOTE — Assessment & Plan Note (Signed)
Most recent TSH 0.53; plan to continue Synthroid 112 micrograms daily

## 2017-03-10 ENCOUNTER — Encounter: Payer: Self-pay | Admitting: Internal Medicine

## 2017-03-10 ENCOUNTER — Non-Acute Institutional Stay (SKILLED_NURSING_FACILITY): Payer: Medicare Other | Admitting: Internal Medicine

## 2017-03-10 DIAGNOSIS — E559 Vitamin D deficiency, unspecified: Secondary | ICD-10-CM

## 2017-03-10 DIAGNOSIS — F39 Unspecified mood [affective] disorder: Secondary | ICD-10-CM

## 2017-03-10 DIAGNOSIS — F419 Anxiety disorder, unspecified: Secondary | ICD-10-CM

## 2017-03-10 NOTE — Progress Notes (Signed)
Location:  [facility] Room Number: 213D Place of Service:  SNF (31)  [name], MD  Patient Care Team: [name], MD as PCP - General (Internal Medicine)  Extended Emergency Contact Information Primary Emergency Contact: [name] [phone] [name] Home Phone: [phone] Relation: Niece Secondary Emergency Contact: Dorice Lamas [place] Home Phone: [phone] Relation: Niece    Allergies: Azithromycin; Cephalexin; Cephalosporins; Ciprofloxacin; Darvon [propoxyphene]; Flagyl [metronidazole]; Librax [chlordiazepoxide-clidinium]; Macrodantin [nitrofurantoin macrocrystal]; Nitrofuran derivatives; Other; Oxycodone-acetaminophen; Percocet [oxycodone-acetaminophen]; Quetiapine; Sulfa antibiotics; and Sulfamethoxazole  Chief Complaint  Patient presents with  . Medical Management of Chronic Issues    Routine Visit    HPI: Patient is 77 y.o. female who is being seen for routine issues of vitamin D deficiency, mood disorder, and anxiety.  Past Medical History:  Diagnosis Date  . Acute encephalopathy 02/13/2016  . Anemia   . Anxiety   . Arthritis   . Asthma   . Barrett's esophagus   . Chronic kidney disease   . COPD (chronic obstructive pulmonary disease) (HCC)   . Dementia   . Dementia with behavioral disturbance 02/13/2016  . Depression   . Disease of thyroid gland   . Diverticulitis   . GERD (gastroesophageal reflux disease)   . Hyperlipemia   . Hypertension   . Memory difficulties   . Osteoporosis   . Pancreatitis   . Paroxysmal atrial fibrillation with RVR (HCC) 05/10/2016  . Psoriasis (a type of skin inflammation)   . Vertigo   . Vitamin D deficiency 08/31/2016    Past Surgical History:  Procedure Laterality Date  . BACK SURGERY    . brain shun    . CHOLECYSTECTOMY    . ROTATOR CUFF REPAIR    . SHOULDER SURGERY    . UPPER GI ENDOSCOPY  12/19/2015   [facility],  Include esophagus, stomach & duodenum or / Jejunum: Dx w/wo biopsys and dilatation; Surgeon : Leonia Reeves Rhoton MD    Allergies as of 03/10/2017      Reactions   Azithromycin Other (See Comments)   unknown   Cephalexin Other (See Comments)   unknown   Cephalosporins    Ciprofloxacin Other (See Comments)   unknown   Darvon [propoxyphene]    Flagyl [metronidazole] Other (See Comments)   unknown   Librax [chlordiazepoxide-clidinium] Other (See Comments)   unknown   Macrodantin [nitrofurantoin Macrocrystal]    Nitrofuran Derivatives Other (See Comments)   unknown   Other Other (See Comments)   unknown   Oxycodone-acetaminophen Other (See Comments)   unknown   Percocet [oxycodone-acetaminophen]    Quetiapine Other (See Comments)   unknown Potassium level dropped.   Sulfa Antibiotics    Sulfamethoxazole Other (See Comments)   unknown      Medication List        Accurate as of 03/10/17 11:59 PM. Always use your most recent med list.          acetaminophen 500 MG tablet Commonly known as:  TYLENOL Take 500 mg by mouth. Take 2 tablets twice daily for pain   acetaminophen 325 MG tablet Commonly known as:  TYLENOL Take 650 mg by mouth every 8 (eight) hours as needed.   albuterol 108 (90 Base) MCG/ACT inhaler Commonly known as:  PROVENTIL HFA;VENTOLIN HFA Inhale 2 puffs into the lungs every 6 (six) hours as needed for wheezing.   carbamide peroxide 6.5 %  OTIC solution Commonly known as:  DEBROX Place 5 drops into the left ear at bedtime. loosen excessive ear wax   clonazePAM 1 MG tablet Commonly known as:  KLONOPIN Give 1 mg by mouth at bedtime   D3-1000 1000 units tablet Generic drug:  Cholecalciferol Take 1,000 Units by mouth daily.   donepezil 10 MG tablet Commonly known as:  ARICEPT Take 10 mg by mouth at bedtime.   guaiFENesin 100 MG/5ML liquid Commonly known as:  ROBITUSSIN Take 200 mg by mouth. Take 15cc at bedtime for coughing at night     ipratropium-albuterol 0.5-2.5 (3) MG/3ML Soln Commonly known as:  DUONEB Take 3 mLs by nebulization every 4 (four) hours as needed.   levothyroxine 112 MCG tablet Commonly known as:  SYNTHROID, LEVOTHROID Take 112 mcg by mouth daily before breakfast.   oxybutynin 5 MG tablet Commonly known as:  DITROPAN Take 5 mg by mouth 3 (three) times daily.   pantoprazole 40 MG tablet Commonly known as:  PROTONIX Take 40 mg by mouth 2 (two) times daily.   ranitidine 75 MG tablet Commonly known as:  ZANTAC Take 75 mg by mouth at bedtime.   sodium chloride 0.65 % Soln nasal spray Commonly known as:  OCEAN Place 2 sprays into both nostrils 2 (two) times daily as needed. dry nasal membrane   theophylline 100 MG 12 hr tablet Commonly known as:  THEODUR Take 100 mg by mouth 2 (two) times daily. COPD   venlafaxine 37.5 MG tablet Commonly known as:  EFFEXOR Take 37.5 mg by mouth daily.       No orders of the defined types were placed in this encounter.   Immunization History  Administered Date(s) Administered  . Influenza, High Dose Seasonal PF 12/20/2014  . Influenza, Seasonal, Injecte, Preservative Fre 10/10/2014  . Influenza-Unspecified 12/07/2015, 11/21/2016  . PPD Test 02/10/2016, 02/26/2016  . Pneumococcal Conjugate-13 12/20/2014  . Pneumococcal Polysaccharide-23 02/07/2017  . Td 08/26/2014    Social History   Tobacco Use  . Smoking status: Former Smoker    Last attempt to quit: 12/17/1990    Years since quitting: 26.2  . Smokeless tobacco: Never Used  Substance Use Topics  . Alcohol use: No    Review of Systems  DATA OBTAINED: from patient, nurse GENERAL:  no fevers, fatigue, appetite changes SKIN: No itching, rash HEENT: No complaint RESPIRATORY: No cough, wheezing, SOB CARDIAC: No chest pain, palpitations, lower extremity edema  GI: No abdominal pain, No N/V/D or constipation, No heartburn or reflux  GU: No dysuria, frequency or urgency, or incontinence   MUSCULOSKELETAL: No unrelieved bone/joint pain NEUROLOGIC: No headache, dizziness  PSYCHIATRIC: No overt anxiety or sadness  Vitals:   03/10/17 1045  BP: 110/64  Pulse: 69  Resp: 18  Temp: (!) 97 F (36.1 C)  SpO2: 97%   Body mass index is 27.1 kg/m. Physical Exam  GENERAL APPEARANCE: Alert, conversant, No acute distress  SKIN: No diaphoresis rash HEENT: Unremarkable RESPIRATORY: Breathing is even, unlabored. Lung sounds are clear   CARDIOVASCULAR: Heart RRR no murmurs, rubs or gallops. No peripheral edema  GASTROINTESTINAL: Abdomen is soft, non-tender, not distended w/ normal bowel sounds.  GENITOURINARY: Bladder non tender, not distended  MUSCULOSKELETAL: No abnormal joints or musculature NEUROLOGIC: Cranial nerves 2-12 grossly intact. Moves all extremities PSYCHIATRIC: Mood and affect appropriate to situation, no behavioral issues  Patient Active Problem List   Diagnosis Date Noted  . Hypotension 08/31/2016  . Postural dizziness with presyncope 08/31/2016  . Chest pain  08/31/2016  . Paroxysmal atrial fibrillation (HCC) 08/31/2016  . Hypokalemia 08/31/2016  . Depression 08/31/2016  . Vitamin D deficiency 08/31/2016  . Mood disorder (HCC) 08/22/2016  . PNA (pneumonia) 05/10/2016  . COPD exacerbation (HCC) 05/10/2016  . UTI (urinary tract infection) 05/10/2016  . Paroxysmal atrial fibrillation with RVR (HCC) 05/10/2016  . Metabolic encephalopathy 05/10/2016  . Elevated amylase and lipase 02/13/2016  . Acute encephalopathy 02/13/2016  . Psychosis (HCC) 02/13/2016  . Dementia with behavioral disturbance 02/13/2016  . Overactive bladder 02/13/2016  . Chronic kidney disease   . COPD (chronic obstructive pulmonary disease) (HCC)   . Anemia   . Anxiety   . Barrett's esophagus   . Hypothyroidism   . GERD (gastroesophageal reflux disease)   . Hypertension   . Memory difficulties   . Osteoporosis   . Psoriasis (a type of skin inflammation)     CMP     Component  Value Date/Time   NA 139 08/28/2016   K 3.4 08/28/2016   CL 100 (L) 01/26/2016 1935   CO2 30 01/26/2016 1935   GLUCOSE 115 (H) 01/26/2016 1935   BUN 7 08/28/2016   CREATININE 1.0 08/28/2016   CREATININE 0.83 01/26/2016 1935   CALCIUM 9.6 01/26/2016 1935   AST 40 (A) 08/23/2016   ALT 14 08/23/2016   ALKPHOS 79 08/23/2016   GFRNONAA >60 01/26/2016 1935   GFRAA >60 01/26/2016 1935   Recent Labs    05/09/16 05/24/16 08/28/16  NA 142 140 139  K 4.7 4.3 3.4  BUN 9 17 7   CREATININE 1.0 0.8 1.0   Recent Labs    04/29/16 05/24/16 08/23/16  AST 24 23 40*  ALT 15 12 14   ALKPHOS 105 84 79   Recent Labs    05/24/16 05/30/16 08/26/16  WBC 13.2 11.8 6.3  HGB 14.2 13.9 11.5*  HCT 43 42 35*  PLT 201 190 171   No results for input(s): CHOL, LDLCALC, TRIG in the last 8760 hours.  Invalid input(s): HCL No results found for: MICROALBUR Lab Results  Component Value Date   TSH 0.53 01/22/2017   No results found for: HGBA1C No results found for: CHOL, HDL, LDLCALC, LDLDIRECT, TRIG, CHOLHDL  Significant Diagnostic Results in last 30 days:  No results found.  Assessment and Plan  Vitamin D deficiency  Pending; in the meantime continue 1000 units daily  Mood disorder (HCC) Patient calm; continue Effexor 37.5 mg by mouth daily  Anxiety Controlled; continue clonazepam 1 mg by mouth daily at bedtime    Thurston HoleAnne D. Lyn HollingsheadAlexander, MD

## 2017-03-15 ENCOUNTER — Encounter: Payer: Self-pay | Admitting: Internal Medicine

## 2017-03-15 NOTE — Assessment & Plan Note (Signed)
Patient calm; continue Effexor 37.5 mg by mouth daily

## 2017-03-15 NOTE — Assessment & Plan Note (Signed)
Controlled; continue clonazepam 1 mg by mouth daily at bedtime

## 2017-03-15 NOTE — Assessment & Plan Note (Signed)
  Pending; in the meantime continue 1000 units daily

## 2017-03-18 LAB — BASIC METABOLIC PANEL
Creatinine: 0.6 (ref 0.5–1.1)
Glucose: 91
POTASSIUM: 3.8 (ref 3.4–5.3)
SODIUM: 143 (ref 137–147)

## 2017-03-18 LAB — CBC
HCT: 34.9
HGB: 11.4
WBC: 5.6
platelet count: 203

## 2017-03-18 LAB — LIPID PANEL
CHOLESTEROL: 178
Cholesterol: 178 (ref 0–200)
HDL: 37
HDL: 37 (ref 35–70)
LDL CALC: 110
LDL: 110
Triglycerides: 156
Triglycerides: 156 (ref 40–160)

## 2017-03-18 LAB — COMPLETE METABOLIC PANEL WITH GFR
ALBUMIN: 3.8
ALK PHOS: 99
ALT: 7
AST: 9
BILIRUBIN TOTAL: 0.2
BUN: 12 (ref 4–21)
Calcium: 9.2
Creat: 0.64
EGFR (Non-African Amer.): 85.41
GLUCOSE: 91
POTASSIUM: 3.8
Sodium: 143

## 2017-03-18 LAB — CBC AND DIFFERENTIAL
HCT: 35 — AB (ref 36–46)
Hemoglobin: 11.4 — AB (ref 12.0–16.0)
Platelets: 203 (ref 150–399)
WBC: 5.6

## 2017-03-18 LAB — HEPATIC FUNCTION PANEL
ALT: 7 (ref 7–35)
AST: 9 — AB (ref 13–35)
Alkaline Phosphatase: 99 (ref 25–125)
Bilirubin, Total: 0.2

## 2017-03-18 LAB — TSH: TSH: 0.25 — AB (ref 0.41–5.90)

## 2017-03-18 LAB — VITAMIN D 25 HYDROXY (VIT D DEFICIENCY, FRACTURES): Vit D, 25-Hydroxy: 29.92

## 2017-03-18 LAB — HEMOGLOBIN A1C: Hemoglobin A1C: 5.3

## 2017-03-19 ENCOUNTER — Encounter: Payer: Self-pay | Admitting: *Deleted

## 2017-04-07 ENCOUNTER — Encounter: Payer: Self-pay | Admitting: Internal Medicine

## 2017-04-07 ENCOUNTER — Non-Acute Institutional Stay (SKILLED_NURSING_FACILITY): Payer: Medicare Other | Admitting: Internal Medicine

## 2017-04-07 DIAGNOSIS — J189 Pneumonia, unspecified organism: Secondary | ICD-10-CM | POA: Diagnosis not present

## 2017-04-07 NOTE — Progress Notes (Signed)
Location:  [facility] Room Number: 213D Place of Service:  SNF (31)  Margit HanksAlexander, Keiarra Charon D, MD  Patient Care Team: Margit HanksAlexander, Zymere Patlan D, MD as PCP - General (Internal Medicine)  Extended Emergency Contact Information Primary Emergency Contact: Docia BarrierHopkins,Tammy          [name] [phone] [place] Home Phone: [phone] Relation: Niece Secondary Emergency Contact: [name],Donna  [place] Home Phone: [phone] Relation: Niece    Allergies: Azithromycin; Cephalexin; Cephalosporins; Ciprofloxacin; Darvon [propoxyphene]; Flagyl [metronidazole]; Librax [chlordiazepoxide-clidinium]; Macrodantin [nitrofurantoin macrocrystal]; Nitrofuran derivatives; Other; Oxycodone-acetaminophen; Percocet [oxycodone-acetaminophen]; Quetiapine; Sulfa antibiotics; and Sulfamethoxazole  Chief Complaint  Patient presents with  . Medical Management of Chronic Issues    Routine Visit    HPI: Patient is 77 y.o. female who is being seen acutely for cough productive of green and yellow sputum and bilateral shoulder pain with the cough. Patient has not had any fever patient denies shortness of breath. Patient's O2 sat at bedside is 98%, patient is in the dining room for lunch.  Past Medical History:  Diagnosis Date  . Acute encephalopathy 02/13/2016  . Anemia   . Anxiety   . Arthritis   . Asthma   . Barrett's esophagus   . Chronic kidney disease   . COPD (chronic obstructive pulmonary disease) (HCC)   . Dementia   . Dementia with behavioral disturbance 02/13/2016  . Depression   . Disease of thyroid gland   . Diverticulitis   . GERD (gastroesophageal reflux disease)   . Hyperlipemia   . Hypertension   . Memory difficulties   . Osteoporosis   . Pancreatitis   . Paroxysmal atrial fibrillation with RVR (HCC) 05/10/2016  . Psoriasis (a type of skin inflammation)   . Vertigo   . Vitamin D deficiency 08/31/2016    Past Surgical History:  Procedure  Laterality Date  . BACK SURGERY    . brain shun    . CHOLECYSTECTOMY    . ROTATOR CUFF REPAIR    . SHOULDER SURGERY    . UPPER GI ENDOSCOPY  12/19/2015   [facility], Include esophagus, stomach & duodenum or / Jejunum: Dx w/wo biopsys and dilatation; Surgeon : Leonia ReevesAlbert John Rhoton MD    Allergies as of 04/07/2017      Reactions   Azithromycin Other (See Comments)   unknown   Cephalexin Other (See Comments)   unknown   Cephalosporins    Ciprofloxacin Other (See Comments)   unknown   Darvon [propoxyphene]    Flagyl [metronidazole] Other (See Comments)   unknown   Librax [chlordiazepoxide-clidinium] Other (See Comments)   unknown   Macrodantin [nitrofurantoin Macrocrystal]    Nitrofuran Derivatives Other (See Comments)   unknown   Other Other (See Comments)   unknown   Oxycodone-acetaminophen Other (See Comments)   unknown   Percocet [oxycodone-acetaminophen]    Quetiapine Other (See Comments)   unknown Potassium level dropped.   Sulfa Antibiotics    Sulfamethoxazole Other (See Comments)   unknown      Medication List        Accurate as of 04/07/17  2:18 PM. Always use your most recent med list.          acetaminophen 500 MG tablet Commonly known as:  TYLENOL Take 500 mg by mouth 2 (two) times daily as needed. pain   acetaminophen 325 MG tablet Commonly known as:  TYLENOL Take 650 mg by mouth every 8 (eight) hours as needed.   albuterol  108 (90 Base) MCG/ACT inhaler Commonly known as:  PROVENTIL HFA;VENTOLIN HFA Inhale 2 puffs into the lungs every 6 (six) hours as needed for wheezing.   carbamide peroxide 6.5 % OTIC solution Commonly known as:  DEBROX Place 5 drops into the left ear at bedtime. loosen excessive ear wax   clonazePAM 1 MG tablet Commonly known as:  KLONOPIN Give 1 mg by mouth at bedtime   D3-1000 1000 units tablet Generic drug:  Cholecalciferol Take 1,000 Units by mouth daily.   donepezil 10 MG tablet Commonly known as:  ARICEPT Take 10  mg by mouth at bedtime.   guaiFENesin 100 MG/5ML liquid Commonly known as:  ROBITUSSIN Take 200 mg by mouth at bedtime. 15cc coughing at night   guaiFENesin 600 MG 12 hr tablet Commonly known as:  MUCINEX Take 600 mg by mouth every 12 (twelve) hours.   ipratropium-albuterol 0.5-2.5 (3) MG/3ML Soln Commonly known as:  DUONEB Take 3 mLs by nebulization every 4 (four) hours as needed.   levothyroxine 112 MCG tablet Commonly known as:  SYNTHROID, LEVOTHROID Take 112 mcg by mouth daily before breakfast.   oxybutynin 5 MG tablet Commonly known as:  DITROPAN Take 5 mg by mouth 3 (three) times daily.   pantoprazole 40 MG tablet Commonly known as:  PROTONIX Take 40 mg by mouth 2 (two) times daily.   ranitidine 75 MG tablet Commonly known as:  ZANTAC Take 75 mg by mouth at bedtime.   sodium chloride 0.65 % Soln nasal spray Commonly known as:  OCEAN Place 2 sprays into both nostrils 2 (two) times daily as needed. dry nasal membrane   theophylline 100 MG 12 hr tablet Commonly known as:  THEODUR Take 100 mg by mouth 2 (two) times daily. COPD   venlafaxine 37.5 MG tablet Commonly known as:  EFFEXOR Take 37.5 mg by mouth daily.       No orders of the defined types were placed in this encounter.   Immunization History  Administered Date(s) Administered  . Influenza, High Dose Seasonal PF 12/20/2014  . Influenza, Seasonal, Injecte, Preservative Fre 10/10/2014  . Influenza-Unspecified 12/07/2015, 11/21/2016  . PPD Test 02/10/2016, 02/26/2016  . Pneumococcal Conjugate-13 12/20/2014  . Pneumococcal Polysaccharide-23 02/07/2017  . Td 08/26/2014    Social History   Tobacco Use  . Smoking status: Former Smoker    Last attempt to quit: 12/17/1990    Years since quitting: 26.3  . Smokeless tobacco: Never Used  Substance Use Topics  . Alcohol use: No    Review of Systems  DATA OBTAINED: from patient, nurse GENERAL:  no fevers, fatigue, appetite changes SKIN: No itching,  rash HEENT: No complaint RESPIRATORY: :+ cough, No wheezing, SOB CARDIAC: No chest pain, palpitations, lower extremity edema but bilateral shoulder pain GI: No abdominal pain, No N/V/D or constipation, No heartburn or reflux  GU: No dysuria, frequency or urgency, or incontinence  MUSCULOSKELETAL: No unrelieved bone/joint pain NEUROLOGIC: No headache, dizziness  PSYCHIATRIC: No overt anxiety or sadness  Vitals:   04/07/17 1400  BP: 101/66  Pulse: 77  Resp: (!) 22  Temp: (!) 97.1 F (36.2 C)  SpO2: 96%   Body mass index is 27 kg/m. Physical Exam  GENERAL APPEARANCE: Alert, conversant, No acute distress  SKIN: No diaphoresis rash HEENT: Unremarkable RESPIRATORY: Breathing is even, unlabored. Lung sounds are moderate rhonchi, no wheezing or rales; O2 saturation 98% pulse ox CARDIOVASCULAR: Heart RRR no murmurs, rubs or gallops. No peripheral edema  GASTROINTESTINAL: Abdomen is soft, non-tender,  not distended w/ normal bowel sounds.  GENITOURINARY: Bladder non tender, not distended  MUSCULOSKELETAL: No abnormal joints or musculature NEUROLOGIC: Cranial nerves 2-12 grossly intact. Moves all extremities PSYCHIATRIC: Mood and affect appropriate to situation, no behavioral issues  Patient Active Problem List   Diagnosis Date Noted  . Hypotension 08/31/2016  . Postural dizziness with presyncope 08/31/2016  . Chest pain 08/31/2016  . Paroxysmal atrial fibrillation (HCC) 08/31/2016  . Hypokalemia 08/31/2016  . Depression 08/31/2016  . Vitamin D deficiency 08/31/2016  . Mood disorder (HCC) 08/22/2016  . PNA (pneumonia) 05/10/2016  . COPD exacerbation (HCC) 05/10/2016  . UTI (urinary tract infection) 05/10/2016  . Paroxysmal atrial fibrillation with RVR (HCC) 05/10/2016  . Metabolic encephalopathy 05/10/2016  . Elevated amylase and lipase 02/13/2016  . Acute encephalopathy 02/13/2016  . Psychosis (HCC) 02/13/2016  . Dementia with behavioral disturbance 02/13/2016  . Overactive  bladder 02/13/2016  . Chronic kidney disease   . COPD (chronic obstructive pulmonary disease) (HCC)   . Anemia   . Anxiety   . Barrett's esophagus   . Hypothyroidism   . GERD (gastroesophageal reflux disease)   . Hypertension   . Memory difficulties   . Osteoporosis   . Psoriasis (a type of skin inflammation)     CMP     Component Value Date/Time   NA 143 03/18/2017   NA 143 03/18/2017   K 3.8 03/18/2017   K 3.8 03/18/2017   CL 100 (L) 01/26/2016 1935   CO2 30 01/26/2016 1935   GLUCOSE 115 (H) 01/26/2016 1935   BUN 12 03/18/2017   CREATININE 0.64 03/18/2017   CREATININE 0.6 03/18/2017   CALCIUM 9.2 03/18/2017   ALBUMIN 3.8 03/18/2017   AST 9 03/18/2017   AST 9 (A) 03/18/2017   ALT 7 03/18/2017   ALT 7 03/18/2017   ALKPHOS 99 03/18/2017   ALKPHOS 99 03/18/2017   BILITOT 0.2 03/18/2017   GFRNONAA 85.41 03/18/2017   GFRAA >60 01/26/2016 1935   Recent Labs    05/24/16 08/28/16 03/18/17  NA 140 139 143  143  K 4.3 3.4 3.8  3.8  BUN 17 7 12   CREATININE 0.8 1.0 0.6  0.64  CALCIUM  --   --  9.2   Recent Labs    08/23/16 03/18/17  AST 40* 9*  9  ALT 14 7  7   ALKPHOS 79 99  99  BILITOT  --  0.2  ALBUMIN  --  3.8   Recent Labs    05/30/16 08/26/16 03/18/17  WBC 11.8 6.3 5.6  5.6  HGB 13.9 11.5* 11.4*  11.4  HCT 42 35* 35*  34.9  PLT 190 171 203   Recent Labs    03/18/17  CHOL 178  178  LDLCALC 110  TRIG 156  156   No results found for: MICROALBUR Lab Results  Component Value Date   TSH 0.25 (A) 03/18/2017   Lab Results  Component Value Date   HGBA1C 5.3 03/18/2017   Lab Results  Component Value Date   CHOL 178 03/18/2017   CHOL 178 03/18/2017   HDL 37 03/18/2017   LDLCALC 110 03/18/2017   TRIG 156 03/18/2017   TRIG 156 03/18/2017    Significant Diagnostic Results in last 30 days:  No results found.  Assessment and Plan  Left hilar pneumonia-chest x-ray shows a left hilar infiltrate since 11 2018; patient presents with  pneumonia but the presence of this infiltrate for 4 months is disturbing; patient will need a  CT scan in about 4 weeks to determine whether something underlying this pneumonia; patient is allergic to multiple antibiotics but she says has taken Levaquin in the past; I have looked through her record and she has indeed taken Levaquin in the past so will be using Levaquin 750 mg by mouth daily for 5 days along with Mucinex 600 mg twice a day for 10 days; will continue to monitor     Lysette Lindenbaum D. [name], MD

## 2017-04-12 ENCOUNTER — Encounter: Payer: Self-pay | Admitting: Internal Medicine

## 2017-04-30 ENCOUNTER — Encounter: Payer: Self-pay | Admitting: Internal Medicine

## 2017-04-30 ENCOUNTER — Non-Acute Institutional Stay (SKILLED_NURSING_FACILITY): Payer: Medicare Other | Admitting: Internal Medicine

## 2017-04-30 DIAGNOSIS — J449 Chronic obstructive pulmonary disease, unspecified: Secondary | ICD-10-CM

## 2017-04-30 DIAGNOSIS — F329 Major depressive disorder, single episode, unspecified: Secondary | ICD-10-CM | POA: Diagnosis not present

## 2017-04-30 DIAGNOSIS — F32A Depression, unspecified: Secondary | ICD-10-CM

## 2017-04-30 DIAGNOSIS — I480 Paroxysmal atrial fibrillation: Secondary | ICD-10-CM | POA: Diagnosis not present

## 2017-04-30 NOTE — Progress Notes (Signed)
Location:  [facility] Room Number: 213D Place of Service:  SNF (31)  [name], MD  Patient Care Team: [name], MD as PCP - General (Internal Medicine)  Extended Emergency Contact Information Primary Emergency Contact: [name] [id number] [name] Home Phone: [phone] Relation: Niece Secondary Emergency Contact: Dorice Lamas [place] Home Phone: [phone] Relation: Niece    Allergies: Azithromycin; Cephalexin; Cephalosporins; Ciprofloxacin; Darvon [propoxyphene]; Flagyl [metronidazole]; Librax [chlordiazepoxide-clidinium]; Macrodantin [nitrofurantoin macrocrystal]; Nitrofuran derivatives; Other; Oxycodone-acetaminophen; Percocet [oxycodone-acetaminophen]; Quetiapine; Sulfa antibiotics; and Sulfamethoxazole  Chief Complaint  Patient presents with  . Medical Management of Chronic Issues    HPI: Patient is 77 y.o. female who is seen for routine issues of paroxysmal atrial fibrillation, COPD, and depression.  Past Medical History:  Diagnosis Date  . Acute encephalopathy 02/13/2016  . Anemia   . Anxiety   . Arthritis   . Asthma   . Barrett's esophagus   . Chronic kidney disease   . COPD (chronic obstructive pulmonary disease) (HCC)   . Dementia   . Dementia with behavioral disturbance 02/13/2016  . Depression   . Disease of thyroid gland   . Diverticulitis   . GERD (gastroesophageal reflux disease)   . Hyperlipemia   . Hypertension   . Memory difficulties   . Osteoporosis   . Pancreatitis   . Paroxysmal atrial fibrillation with RVR (HCC) 05/10/2016  . Psoriasis (a type of skin inflammation)   . Vertigo   . Vitamin D deficiency 08/31/2016    Past Surgical History:  Procedure Laterality Date  . BACK SURGERY    . brain shun    . CHOLECYSTECTOMY    . ROTATOR CUFF REPAIR    . SHOULDER SURGERY    . UPPER GI ENDOSCOPY  12/19/2015   [facility], Include esophagus,  stomach & duodenum or / Jejunum: Dx w/wo biopsys and dilatation; Surgeon : Leonia Reeves Rhoton MD    Allergies as of 04/30/2017      Reactions   Azithromycin Other (See Comments)   unknown   Cephalexin Other (See Comments)   unknown   Cephalosporins    Ciprofloxacin Other (See Comments)   unknown   Darvon [propoxyphene]    Flagyl [metronidazole] Other (See Comments)   unknown   Librax [chlordiazepoxide-clidinium] Other (See Comments)   unknown   Macrodantin [nitrofurantoin Macrocrystal]    Nitrofuran Derivatives Other (See Comments)   unknown   Other Other (See Comments)   unknown   Oxycodone-acetaminophen Other (See Comments)   unknown   Percocet [oxycodone-acetaminophen]    Quetiapine Other (See Comments)   unknown Potassium level dropped.   Sulfa Antibiotics    Sulfamethoxazole Other (See Comments)   unknown      Medication List        Accurate as of 04/30/17 11:59 PM. Always use your most recent med list.          acetaminophen 500 MG tablet Commonly known as:  TYLENOL Take 500 mg by mouth 2 (two) times daily as needed. pain   acetaminophen 325 MG tablet Commonly known as:  TYLENOL Take 650 mg by mouth every 8 (eight) hours as needed.   albuterol 108 (90 Base) MCG/ACT inhaler Commonly known as:  PROVENTIL HFA;VENTOLIN HFA Inhale 2 puffs into the lungs every 6 (six) hours as needed for wheezing.   carbamide peroxide 6.5 % OTIC solution Commonly known as:  DEBROX  Place 5 drops into the left ear at bedtime. loosen excessive ear wax   clonazePAM 1 MG tablet Commonly known as:  KLONOPIN Give 1 mg by mouth at bedtime   D3-1000 1000 units tablet Generic drug:  Cholecalciferol Take 1,000 Units by mouth daily.   donepezil 10 MG tablet Commonly known as:  ARICEPT Take 10 mg by mouth at bedtime.   ipratropium-albuterol 0.5-2.5 (3) MG/3ML Soln Commonly known as:  DUONEB Take 3 mLs by nebulization every 4 (four) hours as needed.   levothyroxine 100 MCG  tablet Commonly known as:  SYNTHROID, LEVOTHROID Take 100 mcg by mouth daily before breakfast.   oxybutynin 5 MG tablet Commonly known as:  DITROPAN Take 5 mg by mouth 3 (three) times daily.   pantoprazole 40 MG tablet Commonly known as:  PROTONIX Take 40 mg by mouth 2 (two) times daily.   ranitidine 75 MG tablet Commonly known as:  ZANTAC Take 75 mg by mouth at bedtime.   sodium chloride 0.65 % Soln nasal spray Commonly known as:  OCEAN Place 2 sprays into both nostrils 2 (two) times daily as needed. dry nasal membrane   theophylline 100 MG 12 hr tablet Commonly known as:  THEODUR Take 100 mg by mouth 2 (two) times daily. COPD   traMADol 50 MG tablet Commonly known as:  ULTRAM Take 50 mg by mouth 2 (two) times daily.   venlafaxine 37.5 MG tablet Commonly known as:  EFFEXOR Take 37.5 mg by mouth daily.       No orders of the defined types were placed in this encounter.   Immunization History  Administered Date(s) Administered  . Influenza, High Dose Seasonal PF 12/20/2014  . Influenza, Seasonal, Injecte, Preservative Fre 10/10/2014  . Influenza-Unspecified 12/07/2015, 11/21/2016  . PPD Test 02/10/2016, 02/26/2016  . Pneumococcal Conjugate-13 12/20/2014  . Pneumococcal Polysaccharide-23 02/07/2017  . Td 08/26/2014    Social History   Tobacco Use  . Smoking status: Former Smoker    Last attempt to quit: 12/17/1990    Years since quitting: 26.3  . Smokeless tobacco: Never Used  Substance Use Topics  . Alcohol use: No    Review of Systems  DATA OBTAINED: from patient, nurse GENERAL:  no fevers, fatigue, appetite changes SKIN: No itching, rash HEENT: No complaint RESPIRATORY: No cough, wheezing, SOB CARDIAC: No chest pain, palpitations, lower extremity edema  GI: No abdominal pain, No N/V/D or constipation, No heartburn or reflux  GU: No dysuria, frequency or urgency, or incontinence  MUSCULOSKELETAL: No unrelieved bone/joint pain NEUROLOGIC: No  headache, dizziness  PSYCHIATRIC: No overt anxiety or sadness  Vitals:   04/30/17 0941  BP: 101/65  Pulse: 68  Resp: (!) 22  Temp: 98 F (36.7 C)  SpO2: 96%   Body mass index is 26.93 kg/m. Physical Exam  GENERAL APPEARANCE: Alert, conversant, No acute distress  SKIN: No diaphoresis rash HEENT: Unremarkable RESPIRATORY: Breathing is even, unlabored. Lung sounds are clear   CARDIOVASCULAR: Heart RRR no murmurs, rubs or gallops. No peripheral edema  GASTROINTESTINAL: Abdomen is soft, non-tender, not distended w/ normal bowel sounds.  GENITOURINARY: Bladder non tender, not distended  MUSCULOSKELETAL: No abnormal joints or musculature NEUROLOGIC: Cranial nerves 2-12 grossly intact. Moves all extremities PSYCHIATRIC: Mood and affect appropriate to situation, no behavioral issues  Patient Active Problem List   Diagnosis Date Noted  . Hypotension 08/31/2016  . Postural dizziness with presyncope 08/31/2016  . Chest pain 08/31/2016  . Paroxysmal atrial fibrillation (HCC) 08/31/2016  . Hypokalemia 08/31/2016  .  Depression 08/31/2016  . Vitamin D deficiency 08/31/2016  . Mood disorder (HCC) 08/22/2016  . PNA (pneumonia) 05/10/2016  . COPD exacerbation (HCC) 05/10/2016  . UTI (urinary tract infection) 05/10/2016  . Paroxysmal atrial fibrillation with RVR (HCC) 05/10/2016  . Metabolic encephalopathy 05/10/2016  . Elevated amylase and lipase 02/13/2016  . Acute encephalopathy 02/13/2016  . Psychosis (HCC) 02/13/2016  . Dementia with behavioral disturbance 02/13/2016  . Overactive bladder 02/13/2016  . Chronic kidney disease   . COPD (chronic obstructive pulmonary disease) (HCC)   . Anemia   . Anxiety   . Barrett's esophagus   . Hypothyroidism   . GERD (gastroesophageal reflux disease)   . Hypertension   . Memory difficulties   . Osteoporosis   . Psoriasis (a type of skin inflammation)     CMP     Component Value Date/Time   NA 143 03/18/2017   NA 143 03/18/2017   K  3.8 03/18/2017   K 3.8 03/18/2017   CL 100 (L) 01/26/2016 1935   CO2 30 01/26/2016 1935   GLUCOSE 115 (H) 01/26/2016 1935   BUN 12 03/18/2017   CREATININE 0.64 03/18/2017   CREATININE 0.6 03/18/2017   CALCIUM 9.2 03/18/2017   ALBUMIN 3.8 03/18/2017   AST 9 03/18/2017   AST 9 (A) 03/18/2017   ALT 7 03/18/2017   ALT 7 03/18/2017   ALKPHOS 99 03/18/2017   ALKPHOS 99 03/18/2017   BILITOT 0.2 03/18/2017   GFRNONAA 85.41 03/18/2017   GFRAA >60 01/26/2016 1935   Recent Labs    05/24/16 08/28/16 03/18/17  NA 140 139 143  143  K 4.3 3.4 3.8  3.8  BUN 17 7 12   CREATININE 0.8 1.0 0.6  0.64  CALCIUM  --   --  9.2   Recent Labs    08/23/16 03/18/17  AST 40* 9*  9  ALT 14 7  7   ALKPHOS 79 99  99  BILITOT  --  0.2  ALBUMIN  --  3.8   Recent Labs    05/30/16 08/26/16 03/18/17  WBC 11.8 6.3 5.6  5.6  HGB 13.9 11.5* 11.4*  11.4  HCT 42 35* 35*  34.9  PLT 190 171 203   Recent Labs    03/18/17  CHOL 178  178  LDLCALC 110  TRIG 156  156   No results found for: MICROALBUR Lab Results  Component Value Date   TSH 0.25 (A) 03/18/2017   Lab Results  Component Value Date   HGBA1C 5.3 03/18/2017   Lab Results  Component Value Date   CHOL 178 03/18/2017   CHOL 178 03/18/2017   HDL 37 03/18/2017   LDLCALC 110 03/18/2017   TRIG 156 03/18/2017   TRIG 156 03/18/2017    Significant Diagnostic Results in last 30 days:  No results found.  Assessment and Plan  Paroxysmal atrial fibrillation (HCC) Stays in normal sinus rhythm: Patient has never been on any medication for rate or prophylaxis; monitor  COPD (chronic obstructive pulmonary disease) (HCC) Controlled on theophylline 100 mg twice daily as needed albuterol and duo nebs; continue current medications  Depression Very well controlled on Effexor 37.5 mg daily; continue current medication     Thurston Holenne D. Lyn HollingsheadAlexander, MD

## 2017-05-04 ENCOUNTER — Encounter: Payer: Self-pay | Admitting: Internal Medicine

## 2017-05-04 NOTE — Assessment & Plan Note (Signed)
Stays in normal sinus rhythm: Patient has never been on any medication for rate or prophylaxis; monitor

## 2017-05-04 NOTE — Assessment & Plan Note (Signed)
Very well controlled on Effexor 37.5 mg daily; continue current medication

## 2017-05-04 NOTE — Assessment & Plan Note (Signed)
Controlled on theophylline 100 mg twice daily as needed albuterol and duo nebs; continue current medications

## 2017-05-05 ENCOUNTER — Non-Acute Institutional Stay (SKILLED_NURSING_FACILITY): Payer: Medicare Other | Admitting: Internal Medicine

## 2017-05-05 DIAGNOSIS — R21 Rash and other nonspecific skin eruption: Secondary | ICD-10-CM

## 2017-05-06 ENCOUNTER — Encounter: Payer: Self-pay | Admitting: Internal Medicine

## 2017-05-06 NOTE — Progress Notes (Signed)
Location:  [facility] Room Number: 213D Place of Service:  SNF (31)  Rachel HanksAlexander, Genifer Lazenby D, MD  Patient Care Team: Rachel HanksAlexander, Les Longmore D, MD as PCP - General (Internal Medicine)  Extended Emergency Contact Information Primary Emergency Contact: Docia BarrierHopkins,Tammy          [name] [phone] [place] Home Phone: [phone] Relation: Niece Secondary Emergency Contact: [name],Donna  [place] Home Phone: [phone] Relation: Niece    Allergies: Azithromycin; Cephalexin; Cephalosporins; Ciprofloxacin; Darvon [propoxyphene]; Flagyl [metronidazole]; Librax [chlordiazepoxide-clidinium]; Macrodantin [nitrofurantoin macrocrystal]; Nitrofuran derivatives; Other; Oxycodone-acetaminophen; Percocet [oxycodone-acetaminophen]; Quetiapine; Sulfa antibiotics; and Sulfamethoxazole  Chief Complaint  Patient presents with  . Acute Visit    Rash on face     HPI: Patient is 77 y.o. female who came up to me in the hall today to ask me about a rash on the tip of her nose.  She said it had been there for several days, was a little itchy, did not hurt.  She denied any pain or discharge from her eyes.  I did note some other lesions on the right side of her forehead.  Very nonspecific but the rash on her nose was in clusters.  Patient denied fever, muscle aches, nausea, vomiting or any other systemic symptoms.  Nothing makes it better, and nothing makes it worse.  Past Medical History:  Diagnosis Date  . Acute encephalopathy 02/13/2016  . Anemia   . Anxiety   . Arthritis   . Asthma   . Barrett's esophagus   . Chronic kidney disease   . COPD (chronic obstructive pulmonary disease) (HCC)   . Dementia   . Dementia with behavioral disturbance 02/13/2016  . Depression   . Disease of thyroid gland   . Diverticulitis   . GERD (gastroesophageal reflux disease)   . Hyperlipemia   . Hypertension   . Memory difficulties   . Osteoporosis   . Pancreatitis   .  Paroxysmal atrial fibrillation with RVR (HCC) 05/10/2016  . Psoriasis (a type of skin inflammation)   . Vertigo   . Vitamin D deficiency 08/31/2016    Past Surgical History:  Procedure Laterality Date  . BACK SURGERY    . brain shun    . CHOLECYSTECTOMY    . ROTATOR CUFF REPAIR    . SHOULDER SURGERY    . UPPER GI ENDOSCOPY  12/19/2015   [facility] End[facility], Include esophagus, stomach & duodenum or / Jejunum: Dx w/wo biopsys and dilatation; Surgeon : Leonia ReevesAlbert John Rhoton MD    Allergies as of 05/05/2017      Reactions   Azithromycin Other (See Comments)   unknown   Cephalexin Other (See Comments)   unknown   Cephalosporins    Ciprofloxacin Other (See Comments)   unknown   Darvon [propoxyphene]    Flagyl [metronidazole] Other (See Comments)   unknown   Librax [chlordiazepoxide-clidinium] Other (See Comments)   unknown   Macrodantin [nitrofurantoin Macrocrystal]    Nitrofuran Derivatives Other (See Comments)   unknown   Other Other (See Comments)   unknown   Oxycodone-acetaminophen Other (See Comments)   unknown   Percocet [oxycodone-acetaminophen]    Quetiapine Other (See Comments)   unknown Potassium level dropped.   Sulfa Antibiotics    Sulfamethoxazole Other (See Comments)   unknown      Medication List        Accurate as of 05/05/17 11:59 PM. Always use your most recent med list.  acetaminophen 500 MG tablet Commonly known as:  TYLENOL Take 500 mg by mouth 2 (two) times daily as needed. pain   acetaminophen 325 MG tablet Commonly known as:  TYLENOL Take 650 mg by mouth every 8 (eight) hours as needed.   albuterol 108 (90 Base) MCG/ACT inhaler Commonly known as:  PROVENTIL HFA;VENTOLIN HFA Inhale 2 puffs into the lungs every 6 (six) hours as needed for wheezing.   carbamide peroxide 6.5 % OTIC solution Commonly known as:  DEBROX Place 5 drops into the left ear at bedtime. loosen excessive ear wax   clonazePAM 1 MG tablet Commonly known as:   KLONOPIN Give 1 mg by mouth at bedtime   D3-1000 1000 units tablet Generic drug:  Cholecalciferol Take 1,000 Units by mouth daily.   donepezil 10 MG tablet Commonly known as:  ARICEPT Take 10 mg by mouth at bedtime.   ipratropium-albuterol 0.5-2.5 (3) MG/3ML Soln Commonly known as:  DUONEB Take 3 mLs by nebulization every 4 (four) hours as needed.   levothyroxine 100 MCG tablet Commonly known as:  SYNTHROID, LEVOTHROID Take 100 mcg by mouth daily before breakfast.   oxybutynin 5 MG tablet Commonly known as:  DITROPAN Take 5 mg by mouth 3 (three) times daily.   pantoprazole 40 MG tablet Commonly known as:  PROTONIX Take 40 mg by mouth 2 (two) times daily.   ranitidine 75 MG tablet Commonly known as:  ZANTAC Take 75 mg by mouth at bedtime.   sodium chloride 0.65 % Soln nasal spray Commonly known as:  OCEAN Place 2 sprays into both nostrils 2 (two) times daily as needed. dry nasal membrane   theophylline 100 MG 12 hr tablet Commonly known as:  THEODUR Take 100 mg by mouth 2 (two) times daily. COPD   traMADol 50 MG tablet Commonly known as:  ULTRAM Take 50 mg by mouth 2 (two) times daily.   venlafaxine 37.5 MG tablet Commonly known as:  EFFEXOR Take 37.5 mg by mouth daily.       No orders of the defined types were placed in this encounter.   Immunization History  Administered Date(s) Administered  . Influenza, High Dose Seasonal PF 12/20/2014  . Influenza, Seasonal, Injecte, Preservative Fre 10/10/2014  . Influenza-Unspecified 12/07/2015, 11/21/2016  . PPD Test 02/10/2016, 02/26/2016  . Pneumococcal Conjugate-13 12/20/2014  . Pneumococcal Polysaccharide-23 02/07/2017  . Td 08/26/2014    Social History   Tobacco Use  . Smoking status: Former Smoker    Last attempt to quit: 12/17/1990    Years since quitting: 26.4  . Smokeless tobacco: Never Used  Substance Use Topics  . Alcohol use: No    Review of Systems  DATA OBTAINED: from patient GENERAL:   no fevers, fatigue, appetite changes SKIN: As per history of present illness HEENT: No complaint RESPIRATORY: No cough, wheezing, SOB CARDIAC: No chest pain, palpitations, lower extremity edema  GI: No abdominal pain, No N/V/D or constipation, No heartburn or reflux  GU: No dysuria, frequency or urgency, or incontinence  MUSCULOSKELETAL: No unrelieved bone/joint pain NEUROLOGIC: No headache, dizziness  PSYCHIATRIC: No overt anxiety or sadness  Vitals:   05/05/17 1428  BP: 101/65  Pulse: 76  Resp: 18  Temp: (!) 97.5 F (36.4 C)  SpO2: 98%   Body mass index is 26.93 kg/m. Physical Exam  GENERAL APPEARANCE: Alert, conversant, No acute distress  SKIN: Nonspecific pinkish scaly papules and a cholesterol of the tip of patient's nose; there were some other lesions on patient's right forehead  HEENT: There is no redness discharge or conjunctivitis RESPIRATORY: Breathing is even, unlabored. Lung sounds are clear   CARDIOVASCULAR: Heart RRR no murmurs, rubs or gallops. No peripheral edema  GASTROINTESTINAL: Abdomen is soft, non-tender, not distended w/ normal bowel sounds.  GENITOURINARY: Bladder non tender, not distended  MUSCULOSKELETAL: No abnormal joints or musculature NEUROLOGIC: Cranial nerves 2-12 grossly intact. Moves all extremities PSYCHIATRIC: Mood and affect appropriate to situation, no behavioral issues  Patient Active Problem List   Diagnosis Date Noted  . Hypotension 08/31/2016  . Postural dizziness with presyncope 08/31/2016  . Chest pain 08/31/2016  . Paroxysmal atrial fibrillation (HCC) 08/31/2016  . Hypokalemia 08/31/2016  . Depression 08/31/2016  . Vitamin D deficiency 08/31/2016  . Mood disorder (HCC) 08/22/2016  . PNA (pneumonia) 05/10/2016  . COPD exacerbation (HCC) 05/10/2016  . UTI (urinary tract infection) 05/10/2016  . Paroxysmal atrial fibrillation with RVR (HCC) 05/10/2016  . Metabolic encephalopathy 05/10/2016  . Elevated amylase and lipase  02/13/2016  . Acute encephalopathy 02/13/2016  . Psychosis (HCC) 02/13/2016  . Dementia with behavioral disturbance 02/13/2016  . Overactive bladder 02/13/2016  . Chronic kidney disease   . COPD (chronic obstructive pulmonary disease) (HCC)   . Anemia   . Anxiety   . Barrett's esophagus   . Hypothyroidism   . GERD (gastroesophageal reflux disease)   . Hypertension   . Memory difficulties   . Osteoporosis   . Psoriasis (a type of skin inflammation)     CMP     Component Value Date/Time   NA 143 03/18/2017   NA 143 03/18/2017   K 3.8 03/18/2017   K 3.8 03/18/2017   CL 100 (L) 01/26/2016 1935   CO2 30 01/26/2016 1935   GLUCOSE 115 (H) 01/26/2016 1935   BUN 12 03/18/2017   CREATININE 0.64 03/18/2017   CREATININE 0.6 03/18/2017   CALCIUM 9.2 03/18/2017   ALBUMIN 3.8 03/18/2017   AST 9 03/18/2017   AST 9 (A) 03/18/2017   ALT 7 03/18/2017   ALT 7 03/18/2017   ALKPHOS 99 03/18/2017   ALKPHOS 99 03/18/2017   BILITOT 0.2 03/18/2017   GFRNONAA 85.41 03/18/2017   GFRAA >60 01/26/2016 1935   Recent Labs    05/24/16 08/28/16 03/18/17  NA 140 139 143  143  K 4.3 3.4 3.8  3.8  BUN 17 7 12   CREATININE 0.8 1.0 0.6  0.64  CALCIUM  --   --  9.2   Recent Labs    08/23/16 03/18/17  AST 40* 9*  9  ALT 14 7  7   ALKPHOS 79 99  99  BILITOT  --  0.2  ALBUMIN  --  3.8   Recent Labs    05/30/16 08/26/16 03/18/17  WBC 11.8 6.3 5.6  5.6  HGB 13.9 11.5* 11.4*  11.4  HCT 42 35* 35*  34.9  PLT 190 171 203   Recent Labs    03/18/17  CHOL 178  178  LDLCALC 110  TRIG 156  156   No results found for: MICROALBUR Lab Results  Component Value Date   TSH 0.25 (A) 03/18/2017   Lab Results  Component Value Date   HGBA1C 5.3 03/18/2017   Lab Results  Component Value Date   CHOL 178 03/18/2017   CHOL 178 03/18/2017   HDL 37 03/18/2017   LDLCALC 110 03/18/2017   TRIG 156 03/18/2017   TRIG 156 03/18/2017    Significant Diagnostic Results in last 30 days:  No  results found.  Assessment and Plan  Rash on right face and tip of nose-I told the patient that while her rash seemed very nonspecific there was one thing that would be unfortunate if we did not treated and that thing would be zoster.  The fact that is on the tip of her nose and on the right side of her face is worrisome and the treatment is simple.  Patient has not had a Zostavax.  Knowing all this patient agreed so I have written her for Valtrex 1000 mg every 8 times 7 days     Randon Goldsmith. [name], MD

## 2017-05-06 NOTE — Progress Notes (Signed)
Opened in error; Disregard.

## 2017-05-11 ENCOUNTER — Encounter: Payer: Self-pay | Admitting: Internal Medicine

## 2017-05-20 ENCOUNTER — Encounter: Payer: Self-pay | Admitting: Internal Medicine

## 2017-05-20 ENCOUNTER — Non-Acute Institutional Stay (SKILLED_NURSING_FACILITY): Payer: Medicare Other | Admitting: Internal Medicine

## 2017-05-20 DIAGNOSIS — W57XXXA Bitten or stung by nonvenomous insect and other nonvenomous arthropods, initial encounter: Secondary | ICD-10-CM | POA: Diagnosis not present

## 2017-05-20 DIAGNOSIS — S40262A Insect bite (nonvenomous) of left shoulder, initial encounter: Secondary | ICD-10-CM | POA: Diagnosis not present

## 2017-05-20 DIAGNOSIS — S40862A Insect bite (nonvenomous) of left upper arm, initial encounter: Secondary | ICD-10-CM | POA: Diagnosis not present

## 2017-05-20 DIAGNOSIS — L089 Local infection of the skin and subcutaneous tissue, unspecified: Secondary | ICD-10-CM | POA: Diagnosis not present

## 2017-05-20 DIAGNOSIS — I10 Essential (primary) hypertension: Secondary | ICD-10-CM

## 2017-05-20 NOTE — Progress Notes (Signed)
[facility] Room Number: 213/W Place of Service:  SNF [phone] Provider:  Caleb PoppAnjali, [name]  [name], [name], MD  Patient Care Team: Margit HanksAlexander, [name], MD as PCP - General (Internal Medicine)  Extended Emergency Contact Information Primary Emergency Contact: Docia BarrierHopkins,Tammy          [name] [phone] [place] Home Phone: [phone] Relation: Niece Secondary Emergency Contact: [name],Donna  [place] Home Phone: [phone] Relation: Niece  Code Status:  DNR Goals of care: Advanced Directive information Advanced Directives 05/20/2017  Does Patient Have a Medical Advance Directive? Yes  Type of Advance Directive Out of facility DNR (pink MOST or yellow form)  Does patient want to make changes to medical advance directive? No - Patient declined  Copy of Healthcare Power of Attorney in Chart? -  Pre-existing out of facility DNR order (yellow form or pink MOST form) -     Chief Complaint  Patient presents with  . Acute Visit    Patients c/o Spider bite to left elbow    HPI:  Pt is a 77 y.o. female seen today for an acute visit for ? Bug Bite on her Left Elbow  Nurses wanted me to see the patient today has they had noticed a bug bite on her left elbow possible spider bite with some swelling and redness around it. Patient does not remember  getting the bite.  She thinks it happened in her bed She does not have any fever or chills.  She did have pain in that area.  She said that she had something similar on her stomach to which got resolved itself.  Past Medical History:  Diagnosis Date  . Acute encephalopathy 02/13/2016  . Anemia   . Anxiety   . Arthritis   . Asthma   . Barrett's esophagus   . Chronic kidney disease   . COPD (chronic obstructive pulmonary disease) (HCC)   . Dementia   . Dementia with behavioral disturbance 02/13/2016  . Depression   . Disease of thyroid gland   . Diverticulitis   . GERD  (gastroesophageal reflux disease)   . Hyperlipemia   . Hypertension   . Memory difficulties   . Osteoporosis   . Pancreatitis   . Paroxysmal atrial fibrillation with RVR (HCC) 05/10/2016  . Psoriasis (a type of skin inflammation)   . Vertigo   . Vitamin D deficiency 08/31/2016   Past Surgical History:  Procedure Laterality Date  . BACK SURGERY    . brain shun    . CHOLECYSTECTOMY    . ROTATOR CUFF REPAIR    . SHOULDER SURGERY    . UPPER GI ENDOSCOPY  12/19/2015   [facility], Include esophagus, stomach & duodenum or / Jejunum: Dx w/wo biopsys and dilatation; Surgeon : Leonia ReevesAlbert John Rhoton MD    Allergies  Allergen Reactions  . Azithromycin Other (See Comments)    unknown  . Cephalexin Other (See Comments)    unknown  . Cephalosporins   . Ciprofloxacin Other (See Comments)    unknown  . Darvon [Propoxyphene]   . Flagyl [Metronidazole] Other (See Comments)    unknown  . Librax [Chlordiazepoxide-Clidinium] Other (See Comments)    unknown  . Macrodantin [Nitrofurantoin Macrocrystal]   . Nitrofuran Derivatives Other (See Comments)    unknown  . Other Other (See Comments)    unknown  . Oxycodone-Acetaminophen Other (See Comments)    unknown  . Percocet [Oxycodone-Acetaminophen]   .  Quetiapine Other (See Comments)    unknown Potassium level dropped.  . Sulfa Antibiotics   . Sulfamethoxazole Other (See Comments)    unknown    Allergies as of 05/20/2017      Reactions   Azithromycin Other (See Comments)   unknown   Cephalexin Other (See Comments)   unknown   Cephalosporins    Ciprofloxacin Other (See Comments)   unknown   Darvon [propoxyphene]    Flagyl [metronidazole] Other (See Comments)   unknown   Librax [chlordiazepoxide-clidinium] Other (See Comments)   unknown   Macrodantin [nitrofurantoin Macrocrystal]    Nitrofuran Derivatives Other (See Comments)   unknown   Other Other (See Comments)   unknown   Oxycodone-acetaminophen Other (See Comments)   unknown    Percocet [oxycodone-acetaminophen]    Quetiapine Other (See Comments)   unknown Potassium level dropped.   Sulfa Antibiotics    Sulfamethoxazole Other (See Comments)   unknown      Medication List        Accurate as of 05/20/17 11:49 AM. Always use your most recent med list.          acetaminophen 500 MG tablet Commonly known as:  TYLENOL Take 500 mg by mouth 2 (two) times daily as needed. pain   acetaminophen 325 MG tablet Commonly known as:  TYLENOL Take 650 mg by mouth every 8 (eight) hours as needed.   albuterol 108 (90 Base) MCG/ACT inhaler Commonly known as:  PROVENTIL HFA;VENTOLIN HFA Inhale 2 puffs into the lungs every 6 (six) hours as needed for wheezing.   carbamide peroxide 6.5 % OTIC solution Commonly known as:  DEBROX Place 5 drops into the left ear at bedtime. loosen excessive ear wax   clonazePAM 1 MG tablet Commonly known as:  KLONOPIN Give 1 mg by mouth at bedtime   D3-1000 1000 units tablet Generic drug:  Cholecalciferol Take 1,000 Units by mouth daily.   donepezil 10 MG tablet Commonly known as:  ARICEPT Take 10 mg by mouth at bedtime.   ipratropium-albuterol 0.5-2.5 (3) MG/3ML Soln Commonly known as:  DUONEB Take 3 mLs by nebulization every 4 (four) hours as needed.   levothyroxine 100 MCG tablet Commonly known as:  SYNTHROID, LEVOTHROID Take 100 mcg by mouth daily before breakfast.   oxybutynin 5 MG tablet Commonly known as:  DITROPAN Take 5 mg by mouth 3 (three) times daily.   pantoprazole 40 MG tablet Commonly known as:  PROTONIX Take 40 mg by mouth 2 (two) times daily.   ranitidine 75 MG tablet Commonly known as:  ZANTAC Take 75 mg by mouth at bedtime.   sodium chloride 0.65 % Soln nasal spray Commonly known as:  OCEAN Place 2 sprays into both nostrils 2 (two) times daily as needed. dry nasal membrane   theophylline 100 MG 12 hr tablet Commonly known as:  THEODUR Take 100 mg by mouth 2 (two) times daily. COPD   traMADol 50  MG tablet Commonly known as:  ULTRAM Take 50 mg by mouth 2 (two) times daily.   venlafaxine 37.5 MG tablet Commonly known as:  EFFEXOR Take 37.5 mg by mouth daily.       Review of Systems  Review of Systems  Constitutional: Negative for activity change, appetite change, chills, diaphoresis, fatigue and fever.  HENT: Negative for mouth sores, postnasal drip, rhinorrhea, sinus pain and sore throat.   Respiratory: Negative for apnea, cough, chest tightness, shortness of breath and wheezing.   Cardiovascular: Negative for chest pain, palpitations  and leg swelling.  Gastrointestinal: Negative for abdominal distention, abdominal pain, constipation, diarrhea, nausea and vomiting.  Genitourinary: Negative for dysuria and frequency.  Musculoskeletal: Negative for arthralgias, joint swelling and myalgias.  Neurological: Negative for dizziness, syncope, weakness, light-headedness and numbness.  Psychiatric/Behavioral: Negative for behavioral problems, confusion and sleep disturbance.     Immunization History  Administered Date(s) Administered  . Influenza, High Dose Seasonal PF 12/20/2014  . Influenza, Seasonal, Injecte, Preservative Fre 10/10/2014  . Influenza-Unspecified 12/07/2015, 11/21/2016  . PPD Test 02/10/2016, 02/26/2016  . Pneumococcal Conjugate-13 12/20/2014  . Pneumococcal Polysaccharide-23 02/07/2017  . Td 08/26/2014   Pertinent  Health Maintenance Due  Topic Date Due  . INFLUENZA VACCINE  09/11/2017  . DEXA SCAN  Completed  . PNA vac Low Risk Adult  Completed   Fall Risk  07/31/2016  Falls in the past year? Yes  Number falls in past yr: 2 or more  Injury with Fall? No   Functional Status Survey:    Vitals:   05/20/17 1136  BP: 130/75  Pulse: 78  Resp: 18  Temp: 97.9 F (36.6 C)  TempSrc: Oral   There is no height or weight on file to calculate BMI. Physical Exam  Constitutional: She appears well-developed and well-nourished.  HENT:  Head: Normocephalic.   Mouth/Throat: Oropharynx is clear and moist.  Eyes: Pupils are equal, round, and reactive to light.  Neck: Neck supple.  Cardiovascular: Normal rate and regular rhythm.  Pulmonary/Chest: Effort normal and breath sounds normal.  Abdominal: Soft. Bowel sounds are normal. She exhibits no distension. There is no tenderness. There is no guarding.  Musculoskeletal: She exhibits no edema.  Neurological: She is alert.  Skin:  Patient has a boil-like lesion on her left elbow with swelling around which is little tender.  She does not have any redness or any signs of cellulitis    Labs reviewed: Recent Labs    05/24/16 08/28/16 03/18/17  NA 140 139 143  143  K 4.3 3.4 3.8  3.8  BUN 17 7 12   CREATININE 0.8 1.0 0.6  0.64  CALCIUM  --   --  9.2   Recent Labs    08/23/16 03/18/17  AST 40* 9*  9  ALT 14 7  7   ALKPHOS 79 99  99  BILITOT  --  0.2  ALBUMIN  --  3.8   Recent Labs    05/30/16 08/26/16 03/18/17  WBC 11.8 6.3 5.6  5.6  HGB 13.9 11.5* 11.4*  11.4  HCT 42 35* 35*  34.9  PLT 190 171 203   Lab Results  Component Value Date   TSH 0.25 (A) 03/18/2017   Lab Results  Component Value Date   HGBA1C 5.3 03/18/2017   Lab Results  Component Value Date   CHOL 178 03/18/2017   CHOL 178 03/18/2017   HDL 37 03/18/2017   LDLCALC 110 03/18/2017   TRIG 156 03/18/2017   TRIG 156 03/18/2017    Significant Diagnostic Results in last 30 days:  No results found.  Assessment/Plan  Possible bug bite with swelling Discussed with the nurse will try some Mesalt on the Boil. Will try to avoid antibiotics right now  mark the swelling and will follow closely any worsening or any redness the provider needs to be informed Also discussed with the nurse check her bed and Clean the Sheets   Family/ staff Communication:   Labs/tests ordered:   Total time spent in this patient care encounter was _25 minutes; greater  than 50% of the visit spent counseling patient, reviewing records  , Labs and coordinating care for problems addressed at this encounter.

## 2017-05-23 ENCOUNTER — Encounter: Payer: Self-pay | Admitting: Adult Health

## 2017-05-23 NOTE — Progress Notes (Signed)
[facility] Room Number: 213 W Place of Service:  SNF (31)   CODE STATUS: DNR  Allergies  Allergen Reactions  . Azithromycin Other (See Comments)    unknown  . Cephalexin Other (See Comments)    unknown  . Cephalosporins   . Ciprofloxacin Other (See Comments)    unknown  . Darvon [Propoxyphene]   . Flagyl [Metronidazole] Other (See Comments)    unknown  . Librax [Chlordiazepoxide-Clidinium] Other (See Comments)    unknown  . Macrodantin [Nitrofurantoin Macrocrystal]   . Nitrofuran Derivatives Other (See Comments)    unknown  . Other Other (See Comments)    unknown  . Oxycodone-Acetaminophen Other (See Comments)    unknown  . Percocet [Oxycodone-Acetaminophen]   . Quetiapine Other (See Comments)    unknown Potassium level dropped.  . Sulfa Antibiotics   . Sulfamethoxazole Other (See Comments)    unknown    Chief Complaint  Patient presents with  . Acute Visit    Left arm boil    HPI:  Staff has asked me to look at her left lower arm boil. The area is red hot and inflamed. There is some purulent drainage present. There are no reports of fever present. No changes in appetite present.   Past Medical History:  Diagnosis Date  . Acute encephalopathy 02/13/2016  . Anemia   . Anxiety   . Arthritis   . Asthma   . Barrett's esophagus   . Chronic kidney disease   . COPD (chronic obstructive pulmonary disease) (HCC)   . Dementia   . Dementia with behavioral disturbance 02/13/2016  . Depression   . Disease of thyroid gland   . Diverticulitis   . GERD (gastroesophageal reflux disease)   . Hyperlipemia   . Hypertension   . Memory difficulties   . Osteoporosis   . Pancreatitis   . Paroxysmal atrial fibrillation with RVR (HCC) 05/10/2016  . Psoriasis (a type of skin inflammation)   . Vertigo   . Vitamin D deficiency 08/31/2016    Past Surgical History:  Procedure Laterality Date  . BACK SURGERY    . brain shun    .  CHOLECYSTECTOMY    . ROTATOR CUFF REPAIR    . SHOULDER SURGERY    . UPPER GI ENDOSCOPY  12/19/2015   [facility], Include esophagus, stomach & duodenum or / Jejunum: Dx w/wo biopsys and dilatation; Surgeon : Stasia CavalierAlbert John Rhoton MD    Social History   Socioeconomic History  . Marital status: Widowed    Spouse name: Not on file  . Number of children: Not on file  . Years of education: Not on file  . Highest education level: Not on file  Occupational History  . Not on file  Social Needs  . Financial resource strain: Not on file  . Food insecurity:    Worry: Not on file    Inability: Not on file  . Transportation needs:    Medical: Not on file    Non-medical: Not on file  Tobacco Use  . Smoking status: Former Smoker    Last attempt to quit: 12/17/1990    Years since quitting: 26.4  . Smokeless tobacco: Never Used  Substance and Sexual Activity  . Alcohol use: No  . Drug use: No  . Sexual activity: Never  Lifestyle  . Physical activity:    Days per week: Not on file    Minutes per session: Not on file  .  Stress: Not on file  Relationships  . Social connections:    Talks on phone: Not on file    Gets together: Not on file    Attends religious service: Not on file    Active member of club or organization: Not on file    Attends meetings of clubs or organizations: Not on file    Relationship status: Not on file  . Intimate partner violence:    Fear of current or ex partner: Not on file    Emotionally abused: Not on file    Physically abused: Not on file    Forced sexual activity: Not on file  Other Topics Concern  . Not on file  Social History Narrative   [facility]/10/[facility]   Widowed   Former smoker-stopped 1992   Alcohol none   DNR   Family History  Problem Relation Age of Onset  . Arthritis Mother   . Heart disease Mother   . Hypertension Mother   . Cancer Father   . Arthritis Father   . Heart disease Father   . Alzheimer's  disease Sister   . Arthritis Sister   . Heart disease Sister   . Memory loss Sister   . Cancer Sister   . Heart attack Sister   . Cancer Brother   . Lung disease Brother   . Heart disease Brother       VITAL SIGNS BP 107/67   Pulse 70   Temp 97.7 F (36.5 C)   Resp 18   Ht 5\' 3"  (1.6 m)   Wt 152 lb (68.9 kg)   LMP  (LMP Unknown)   BMI 26.93 kg/m   Outpatient Encounter Medications as of 05/23/2017  Medication Sig  . acetaminophen (TYLENOL) 325 MG tablet Take 650 mg by mouth every 8 (eight) hours as needed.   Marland Kitchen acetaminophen (TYLENOL) 500 MG tablet Take 500 mg by mouth 2 (two) times daily as needed. pain  . albuterol (PROVENTIL HFA;VENTOLIN HFA) 108 (90 Base) MCG/ACT inhaler Inhale 2 puffs into the lungs every 6 (six) hours as needed for wheezing.   . carbamide peroxide (DEBROX) 6.5 % OTIC solution Place 5 drops into the left ear at bedtime. loosen excessive ear wax  . Cholecalciferol (D3-1000) 1000 units tablet Take 1,000 Units by mouth daily.   . clonazePAM (KLONOPIN) 1 MG tablet Give 1 mg by mouth at bedtime  . donepezil (ARICEPT) 10 MG tablet Take 10 mg by mouth at bedtime.  Marland Kitchen ipratropium-albuterol (DUONEB) 0.5-2.5 (3) MG/3ML SOLN Take 3 mLs by nebulization every 4 (four) hours as needed.   Marland Kitchen levothyroxine (SYNTHROID, LEVOTHROID) 100 MCG tablet Take 100 mcg by mouth daily before breakfast.  . oxybutynin (DITROPAN) 5 MG tablet Take 5 mg by mouth 3 (three) times daily.  . pantoprazole (PROTONIX) 40 MG tablet Take 40 mg by mouth 2 (two) times daily.   . ranitidine (ZANTAC) 75 MG tablet Take 75 mg by mouth at bedtime.   . sodium chloride (OCEAN) 0.65 % SOLN nasal spray Place 2 sprays into both nostrils 2 (two) times daily as needed. dry nasal membrane  . theophylline (THEODUR) 100 MG 12 hr tablet Take 100 mg by mouth 2 (two) times daily. COPD  . venlafaxine (EFFEXOR) 37.5 MG tablet Take 37.5 mg by mouth daily.   . [DISCONTINUED] traMADol (ULTRAM) 50 MG tablet Take 50 mg by  mouth 2 (two) times daily.   No facility-administered encounter medications on file as of  05/23/2017.      SIGNIFICANT DIAGNOSTIC EXAMS  LABS REVIEWED: TODAY:   03-18-17: hgb a1c 5.3      ASSESSMENT/ PLAN:    MD is aware of resident's narcotic use and is in agreement with current plan of care. We will attempt to wean resident as apropriate   Synthia Innocent NP [facility]  Contact [phone] Monday through Friday 8am- 5pm  After hours call [phone]  This encounter was created in error - please disregard.

## 2017-06-03 ENCOUNTER — Encounter: Payer: Self-pay | Admitting: Internal Medicine

## 2017-06-03 ENCOUNTER — Non-Acute Institutional Stay (SKILLED_NURSING_FACILITY): Payer: Medicare Other | Admitting: Internal Medicine

## 2017-06-03 DIAGNOSIS — I10 Essential (primary) hypertension: Secondary | ICD-10-CM

## 2017-06-03 DIAGNOSIS — K2100 Gastro-esophageal reflux disease with esophagitis, without bleeding: Secondary | ICD-10-CM

## 2017-06-03 DIAGNOSIS — F0281 Dementia in other diseases classified elsewhere with behavioral disturbance: Secondary | ICD-10-CM

## 2017-06-03 DIAGNOSIS — G301 Alzheimer's disease with late onset: Secondary | ICD-10-CM

## 2017-06-03 DIAGNOSIS — F02818 Dementia in other diseases classified elsewhere, unspecified severity, with other behavioral disturbance: Secondary | ICD-10-CM

## 2017-06-03 NOTE — Progress Notes (Signed)
Location:  [facility] Room Number: 213W Place of Service:  SNF (31)  [name], MD  Patient Care Team: [name], MD as PCP - General (Internal Medicine)  Extended Emergency Contact Information Primary Emergency Contact: [name] [phone] [name] Home Phone: [phone] Relation: Niece Secondary Emergency Contact: Dorice Lamas [place] Home Phone: [phone] Relation: Niece    Allergies: Azithromycin; Cephalexin; Cephalosporins; Ciprofloxacin; Darvon [propoxyphene]; Flagyl [metronidazole]; Librax [chlordiazepoxide-clidinium]; Macrodantin [nitrofurantoin macrocrystal]; Nitrofuran derivatives; Other; Oxycodone-acetaminophen; Percocet [oxycodone-acetaminophen]; Quetiapine; Sulfa antibiotics; and Sulfamethoxazole  Chief Complaint  Patient presents with  . Medical Management of Chronic Issues    Routine Visit    HPI: Patient is 77 y.o. female who is being seen for routine issues of dementia, GERD, and hypertension.  Past Medical History:  Diagnosis Date  . Acute encephalopathy 02/13/2016  . Anemia   . Anxiety   . Arthritis   . Asthma   . Barrett's esophagus   . Chronic kidney disease   . COPD (chronic obstructive pulmonary disease) (HCC)   . Dementia   . Dementia with behavioral disturbance 02/13/2016  . Depression   . Disease of thyroid gland   . Diverticulitis   . GERD (gastroesophageal reflux disease)   . Hyperlipemia   . Hypertension   . Memory difficulties   . Osteoporosis   . Pancreatitis   . Paroxysmal atrial fibrillation with RVR (HCC) 05/10/2016  . Psoriasis (a type of skin inflammation)   . Vertigo   . Vitamin D deficiency 08/31/2016    Past Surgical History:  Procedure Laterality Date  . BACK SURGERY    . brain shun    . CHOLECYSTECTOMY    . ROTATOR CUFF REPAIR    . SHOULDER SURGERY    . UPPER GI ENDOSCOPY  12/19/2015   [facility], Include esophagus,  stomach & duodenum or / Jejunum: Dx w/wo biopsys and dilatation; Surgeon : Leonia Reeves Rhoton MD    Allergies as of 06/03/2017      Reactions   Azithromycin Other (See Comments)   unknown   Cephalexin Other (See Comments)   unknown   Cephalosporins    Ciprofloxacin Other (See Comments)   unknown   Darvon [propoxyphene]    Flagyl [metronidazole] Other (See Comments)   unknown   Librax [chlordiazepoxide-clidinium] Other (See Comments)   unknown   Macrodantin [nitrofurantoin Macrocrystal]    Nitrofuran Derivatives Other (See Comments)   unknown   Other Other (See Comments)   unknown   Oxycodone-acetaminophen Other (See Comments)   unknown   Percocet [oxycodone-acetaminophen]    Quetiapine Other (See Comments)   unknown Potassium level dropped.   Sulfa Antibiotics    Sulfamethoxazole Other (See Comments)   unknown      Medication List        Accurate as of 06/03/17 11:59 PM. Always use your most recent med list.          acetaminophen 500 MG tablet Commonly known as:  TYLENOL Take 500 mg by mouth 2 (two) times daily as needed. pain   acetaminophen 325 MG tablet Commonly known as:  TYLENOL Take 650 mg by mouth every 8 (eight) hours as needed.   albuterol 108 (90 Base) MCG/ACT inhaler Commonly known as:  PROVENTIL HFA;VENTOLIN HFA Inhale 2 puffs into the lungs every 6 (six) hours as needed for wheezing.   carbamide peroxide 6.5 % OTIC solution Commonly  known as:  DEBROX Place 5 drops into the left ear at bedtime. loosen excessive ear wax   clonazePAM 1 MG tablet Commonly known as:  KLONOPIN Give 1 mg by mouth at bedtime   D3-1000 1000 units tablet Generic drug:  Cholecalciferol Take 1,000 Units by mouth daily.   donepezil 10 MG tablet Commonly known as:  ARICEPT Take 10 mg by mouth at bedtime.   ipratropium-albuterol 0.5-2.5 (3) MG/3ML Soln Commonly known as:  DUONEB Take 3 mLs by nebulization every 4 (four) hours as needed.   levothyroxine 100 MCG  tablet Commonly known as:  SYNTHROID, LEVOTHROID Take 100 mcg by mouth daily before breakfast.   oxybutynin 5 MG tablet Commonly known as:  DITROPAN Take 5 mg by mouth 3 (three) times daily.   pantoprazole 40 MG tablet Commonly known as:  PROTONIX Take 40 mg by mouth 2 (two) times daily.   ranitidine 75 MG tablet Commonly known as:  ZANTAC Take 75 mg by mouth at bedtime.   sodium chloride 0.65 % Soln nasal spray Commonly known as:  OCEAN Place 2 sprays into both nostrils 2 (two) times daily as needed. dry nasal membrane   theophylline 100 MG 12 hr tablet Commonly known as:  THEODUR Take 100 mg by mouth 2 (two) times daily. COPD   traMADol 50 MG tablet Commonly known as:  ULTRAM Take 50 mg by mouth 2 (two) times daily.   venlafaxine 37.5 MG tablet Commonly known as:  EFFEXOR Take 37.5 mg by mouth daily.       No orders of the defined types were placed in this encounter.   Immunization History  Administered Date(s) Administered  . Influenza, High Dose Seasonal PF 12/20/2014  . Influenza, Seasonal, Injecte, Preservative Fre 10/10/2014  . Influenza-Unspecified 12/07/2015, 11/21/2016  . PPD Test 02/10/2016, 02/26/2016  . Pneumococcal Conjugate-13 12/20/2014  . Pneumococcal Polysaccharide-23 02/07/2017  . Td 08/26/2014    Social History   Tobacco Use  . Smoking status: Former Smoker    Last attempt to quit: 12/17/1990    Years since quitting: 26.5  . Smokeless tobacco: Never Used  Substance Use Topics  . Alcohol use: No    Review of Systems  DATA OBTAINED: from patient, nurse GENERAL:  no fevers, fatigue, appetite changes SKIN: No itching, rash HEENT: No complaint RESPIRATORY: No cough, wheezing, SOB CARDIAC: No chest pain, palpitations, lower extremity edema  GI: No abdominal pain, No N/V/D or constipation, No heartburn or reflux  GU: No dysuria, frequency or urgency, or incontinence  MUSCULOSKELETAL: No unrelieved bone/joint pain NEUROLOGIC: No  headache, dizziness  PSYCHIATRIC: No overt anxiety or sadness  Vitals:   06/03/17 0957  BP: 99/62  Pulse: 68  Resp: 18  Temp: 97.8 F (36.6 C)  SpO2: 96%   Body mass index is 27 kg/m. Physical Exam  GENERAL APPEARANCE: Alert, conversant, No acute distress  SKIN: No diaphoresis rash HEENT: Unremarkable RESPIRATORY: Breathing is even, unlabored. Lung sounds are clear   CARDIOVASCULAR: Heart RRR no murmurs, rubs or gallops. No peripheral edema  GASTROINTESTINAL: Abdomen is soft, non-tender, not distended w/ normal bowel sounds.  GENITOURINARY: Bladder non tender, not distended  MUSCULOSKELETAL: No abnormal joints or musculature NEUROLOGIC: Cranial nerves 2-12 grossly intact. Moves all extremities PSYCHIATRIC: Mood and affect appropriate to situation, no behavioral issues  Patient Active Problem List   Diagnosis Date Noted  . Hypotension 08/31/2016  . Postural dizziness with presyncope 08/31/2016  . Chest pain 08/31/2016  . Paroxysmal atrial fibrillation (HCC) 08/31/2016  .  Hypokalemia 08/31/2016  . Depression 08/31/2016  . Vitamin D deficiency 08/31/2016  . Mood disorder (HCC) 08/22/2016  . PNA (pneumonia) 05/10/2016  . COPD exacerbation (HCC) 05/10/2016  . UTI (urinary tract infection) 05/10/2016  . Paroxysmal atrial fibrillation with RVR (HCC) 05/10/2016  . Metabolic encephalopathy 05/10/2016  . Elevated amylase and lipase 02/13/2016  . Acute encephalopathy 02/13/2016  . Psychosis (HCC) 02/13/2016  . Dementia with behavioral disturbance 02/13/2016  . Overactive bladder 02/13/2016  . Chronic kidney disease   . COPD (chronic obstructive pulmonary disease) (HCC)   . Anemia   . Anxiety   . Barrett's esophagus   . Hypothyroidism   . GERD (gastroesophageal reflux disease)   . Hypertension   . Memory difficulties   . Osteoporosis   . Psoriasis (a type of skin inflammation)     CMP     Component Value Date/Time   NA 143 03/18/2017   NA 143 03/18/2017   K 3.8  03/18/2017   K 3.8 03/18/2017   CL 100 (L) 01/26/2016 1935   CO2 30 01/26/2016 1935   GLUCOSE 115 (H) 01/26/2016 1935   BUN 12 03/18/2017   CREATININE 0.64 03/18/2017   CREATININE 0.6 03/18/2017   CALCIUM 9.2 03/18/2017   ALBUMIN 3.8 03/18/2017   AST 9 03/18/2017   AST 9 (A) 03/18/2017   ALT 7 03/18/2017   ALT 7 03/18/2017   ALKPHOS 99 03/18/2017   ALKPHOS 99 03/18/2017   BILITOT 0.2 03/18/2017   GFRNONAA 85.41 03/18/2017   GFRAA >60 01/26/2016 1935   Recent Labs    08/28/16 03/18/17  NA 139 143  143  K 3.4 3.8  3.8  BUN 7 12  CREATININE 1.0 0.6  0.64  CALCIUM  --  9.2   Recent Labs    08/23/16 03/18/17  AST 40* 9*  9  ALT 14 7  7   ALKPHOS 79 99  99  BILITOT  --  0.2  ALBUMIN  --  3.8   Recent Labs    08/26/16 03/18/17  WBC 6.3 5.6  5.6  HGB 11.5* 11.4*  11.4  HCT 35* 35*  34.9  PLT 171 203   Recent Labs    03/18/17  CHOL 178  178  LDLCALC 110  TRIG 156  156   No results found for: MICROALBUR Lab Results  Component Value Date   TSH 0.25 (A) 03/18/2017   Lab Results  Component Value Date   HGBA1C 5.3 03/18/2017   Lab Results  Component Value Date   CHOL 178 03/18/2017   CHOL 178 03/18/2017   HDL 37 03/18/2017   LDLCALC 110 03/18/2017   TRIG 156 03/18/2017   TRIG 156 03/18/2017    Significant Diagnostic Results in last 30 days:  No results found.  Assessment and Plan  Dementia with behavioral disturbance Continue stable and mild; continue Aricept 10 mg p.o. daily  GERD (gastroesophageal reflux disease) No reports of reflux; continue tonics 40 mg twice daily and Zantac 75 mg nightly  Hypertension Blood pressure is controlled on no medications; continue to monitor    Peachie Barkalow D. [name], MD

## 2017-06-22 ENCOUNTER — Encounter: Payer: Self-pay | Admitting: Internal Medicine

## 2017-06-22 NOTE — Assessment & Plan Note (Signed)
No reports of reflux; continue tonics 40 mg twice daily and Zantac 75 mg nightly

## 2017-06-22 NOTE — Assessment & Plan Note (Signed)
Continue stable and mild; continue Aricept 10 mg p.o. daily

## 2017-06-22 NOTE — Assessment & Plan Note (Signed)
Blood pressure is controlled on no medications; continue to monitor

## 2017-06-24 ENCOUNTER — Encounter: Payer: Self-pay | Admitting: Internal Medicine

## 2017-06-24 NOTE — Progress Notes (Signed)
Opened in error; Disregard.

## 2017-06-25 ENCOUNTER — Encounter: Payer: Self-pay | Admitting: Internal Medicine

## 2017-06-25 ENCOUNTER — Non-Acute Institutional Stay (SKILLED_NURSING_FACILITY): Payer: Medicare Other | Admitting: Internal Medicine

## 2017-06-25 DIAGNOSIS — E034 Atrophy of thyroid (acquired): Secondary | ICD-10-CM

## 2017-06-25 DIAGNOSIS — N3281 Overactive bladder: Secondary | ICD-10-CM

## 2017-06-25 DIAGNOSIS — E559 Vitamin D deficiency, unspecified: Secondary | ICD-10-CM | POA: Diagnosis not present

## 2017-06-25 NOTE — Progress Notes (Signed)
Location:  [facility] Room Number: [id number] Place of Service:  SNF (31)  [name], MD  Patient Care Team: [name], MD as PCP - General (Internal Medicine)  Extended Emergency Contact Information Primary Emergency Contact: [name] [phone] [name] Home Phone: [phone] Relation: Niece Secondary Emergency Contact: Dorice Lamas [place] Home Phone: [phone] Relation: Niece    Allergies: Azithromycin; Cephalexin; Cephalosporins; Ciprofloxacin; Darvon [propoxyphene]; Flagyl [metronidazole]; Librax [chlordiazepoxide-clidinium]; Macrodantin [nitrofurantoin macrocrystal]; Nitrofuran derivatives; Other; Oxycodone-acetaminophen; Percocet [oxycodone-acetaminophen]; Quetiapine; Sulfa antibiotics; and Sulfamethoxazole  Chief Complaint  Patient presents with  . Medical Management of Chronic Issues    HPI: Patient is 77 y.o. female who is being seen for routine issues of vitamin D deficiency, overactive bladder, and hypothyroidism.  Past Medical History:  Diagnosis Date  . Acute encephalopathy 02/13/2016  . Anemia   . Anxiety   . Arthritis   . Asthma   . Barrett's esophagus   . Chronic kidney disease   . COPD (chronic obstructive pulmonary disease) (HCC)   . Dementia   . Dementia with behavioral disturbance 02/13/2016  . Depression   . Disease of thyroid gland   . Diverticulitis   . GERD (gastroesophageal reflux disease)   . Hyperlipemia   . Hypertension   . Memory difficulties   . Osteoporosis   . Pancreatitis   . Paroxysmal atrial fibrillation with RVR (HCC) 05/10/2016  . Psoriasis (a type of skin inflammation)   . Vertigo   . Vitamin D deficiency 08/31/2016    Past Surgical History:  Procedure Laterality Date  . BACK SURGERY    . brain shun    . CHOLECYSTECTOMY    . ROTATOR CUFF REPAIR    . SHOULDER SURGERY    . UPPER GI ENDOSCOPY  12/19/2015   [facility], Include  esophagus, stomach & duodenum or / Jejunum: Dx w/wo biopsys and dilatation; Surgeon : Leonia Reeves Rhoton MD    Allergies as of 06/25/2017      Reactions   Azithromycin Other (See Comments)   unknown   Cephalexin Other (See Comments)   unknown   Cephalosporins    Ciprofloxacin Other (See Comments)   unknown   Darvon [propoxyphene]    Flagyl [metronidazole] Other (See Comments)   unknown   Librax [chlordiazepoxide-clidinium] Other (See Comments)   unknown   Macrodantin [nitrofurantoin Macrocrystal]    Nitrofuran Derivatives Other (See Comments)   unknown   Other Other (See Comments)   unknown   Oxycodone-acetaminophen Other (See Comments)   unknown   Percocet [oxycodone-acetaminophen]    Quetiapine Other (See Comments)   unknown Potassium level dropped.   Sulfa Antibiotics    Sulfamethoxazole Other (See Comments)   unknown      Medication List        Accurate as of 06/25/17 11:59 PM. Always use your most recent med list.          acetaminophen 500 MG tablet Commonly known as:  TYLENOL Take 500 mg by mouth 2 (two) times daily as needed. pain   acetaminophen 325 MG tablet Commonly known as:  TYLENOL Take 650 mg by mouth every 8 (eight) hours as needed.   albuterol 108 (90 Base) MCG/ACT inhaler Commonly known as:  PROVENTIL HFA;VENTOLIN HFA Inhale 2 puffs into the lungs every 6 (six) hours as needed for wheezing.   carbamide peroxide 6.5 % OTIC solution Commonly known as:  DEBROX Place 5 drops into the left ear. Every other night to loosen excessive ear wax   cetirizine 10 MG tablet Commonly known as:  ZYRTEC Take 10 mg by mouth at bedtime.   clonazePAM 1 MG tablet Commonly known as:  KLONOPIN Give 1 mg by mouth at bedtime   D3-1000 1000 units tablet Generic drug:  Cholecalciferol Take 1,000 Units by mouth daily.   donepezil 10 MG tablet Commonly known as:  ARICEPT Take 10 mg by mouth at bedtime.   ipratropium-albuterol 0.5-2.5 (3) MG/3ML  Soln Commonly known as:  DUONEB Take 3 mLs by nebulization every 4 (four) hours as needed.   levothyroxine 100 MCG tablet Commonly known as:  SYNTHROID, LEVOTHROID Take 100 mcg by mouth daily before breakfast.   oxybutynin 5 MG tablet Commonly known as:  DITROPAN Take 5 mg by mouth 3 (three) times daily.   pantoprazole 40 MG tablet Commonly known as:  PROTONIX Take 40 mg by mouth 2 (two) times daily.   ranitidine 75 MG tablet Commonly known as:  ZANTAC Take 75 mg by mouth at bedtime.   sodium chloride 0.65 % Soln nasal spray Commonly known as:  OCEAN Place 2 sprays into both nostrils 2 (two) times daily as needed. dry nasal membrane   theophylline 100 MG 12 hr tablet Commonly known as:  THEODUR Take 100 mg by mouth 2 (two) times daily. COPD   traMADol 50 MG tablet Commonly known as:  ULTRAM Take 50 mg by mouth 2 (two) times daily.   venlafaxine 37.5 MG tablet Commonly known as:  EFFEXOR Take 37.5 mg by mouth daily.       No orders of the defined types were placed in this encounter.   Immunization History  Administered Date(s) Administered  . Influenza, High Dose Seasonal PF 12/20/2014  . Influenza, Seasonal, Injecte, Preservative Fre 10/10/2014  . Influenza-Unspecified 12/07/2015, 11/21/2016  . PPD Test 02/10/2016, 02/26/2016  . Pneumococcal Conjugate-13 12/20/2014  . Pneumococcal Polysaccharide-23 02/07/2017  . Td 08/26/2014    Social History   Tobacco Use  . Smoking status: Former Smoker    Last attempt to quit: 12/17/1990    Years since quitting: 26.5  . Smokeless tobacco: Never Used  Substance Use Topics  . Alcohol use: No    Review of Systems  DATA OBTAINED: from patient, nurse GENERAL:  no fevers, fatigue, appetite changes SKIN: No itching, rash HEENT: No complaint RESPIRATORY: No cough, wheezing, SOB CARDIAC: No chest pain, palpitations, lower extremity edema  GI: No abdominal pain, No N/V/D or constipation, No heartburn or reflux  GU: No  dysuria, frequency or urgency, or incontinence  MUSCULOSKELETAL: No unrelieved bone/joint pain NEUROLOGIC: No headache, dizziness  PSYCHIATRIC: No overt anxiety or sadness  Vitals:   06/25/17 1453  BP: 97/64  Pulse: 75  Resp: 18  Temp: (!) 97 F (36.1 C)  SpO2: 97%   Body mass index is 26.39 kg/m. Physical Exam  GENERAL APPEARANCE: Alert, conversant, No acute distress  SKIN: No diaphoresis rash HEENT: Unremarkable RESPIRATORY: Breathing is even, unlabored. Lung sounds are clear   CARDIOVASCULAR: Heart RRR no murmurs, rubs or gallops. No peripheral edema  GASTROINTESTINAL: Abdomen is soft, non-tender, not distended w/ normal bowel sounds.  GENITOURINARY: Bladder non tender, not distended  MUSCULOSKELETAL: No abnormal joints or musculature NEUROLOGIC: Cranial nerves 2-12 grossly intact. Moves all extremities PSYCHIATRIC: Mood and affect appropriate to situation, no behavioral issues  Patient Active Problem List   Diagnosis Date Noted  . Hypotension 08/31/2016  .  Postural dizziness with presyncope 08/31/2016  . Chest pain 08/31/2016  . Paroxysmal atrial fibrillation (HCC) 08/31/2016  . Hypokalemia 08/31/2016  . Depression 08/31/2016  . Vitamin D deficiency 08/31/2016  . Mood disorder (HCC) 08/22/2016  . PNA (pneumonia) 05/10/2016  . COPD exacerbation (HCC) 05/10/2016  . UTI (urinary tract infection) 05/10/2016  . Paroxysmal atrial fibrillation with RVR (HCC) 05/10/2016  . Metabolic encephalopathy 05/10/2016  . Elevated amylase and lipase 02/13/2016  . Acute encephalopathy 02/13/2016  . Psychosis (HCC) 02/13/2016  . Dementia with behavioral disturbance 02/13/2016  . Overactive bladder 02/13/2016  . Chronic kidney disease   . COPD (chronic obstructive pulmonary disease) (HCC)   . Anemia   . Anxiety   . Barrett's esophagus   . Hypothyroidism   . GERD (gastroesophageal reflux disease)   . Hypertension   . Memory difficulties   . Osteoporosis   . Psoriasis (a type  of skin inflammation)     CMP     Component Value Date/Time   NA 143 03/18/2017   NA 143 03/18/2017   K 3.8 03/18/2017   K 3.8 03/18/2017   CL 100 (L) 01/26/2016 1935   CO2 30 01/26/2016 1935   GLUCOSE 115 (H) 01/26/2016 1935   BUN 12 03/18/2017   CREATININE 0.64 03/18/2017   CREATININE 0.6 03/18/2017   CALCIUM 9.2 03/18/2017   ALBUMIN 3.8 03/18/2017   AST 9 03/18/2017   AST 9 (A) 03/18/2017   ALT 7 03/18/2017   ALT 7 03/18/2017   ALKPHOS 99 03/18/2017   ALKPHOS 99 03/18/2017   BILITOT 0.2 03/18/2017   GFRNONAA 85.41 03/18/2017   GFRAA >60 01/26/2016 1935   Recent Labs    08/28/16 03/18/17  NA 139 143  143  K 3.4 3.8  3.8  BUN 7 12  CREATININE 1.0 0.6  0.64  CALCIUM  --  9.2   Recent Labs    08/23/16 03/18/17  AST 40* 9*  9  ALT ALKPHOS 79 99  99  BILITOT  --  0.2  ALBUMIN  --  3.8   Recent Labs    08/26/16 03/18/17  WBC 6.3 5.6  5.6  HGB 11.5* 11.4*  11.4  HCT 35* 35*  34.9  PLT 171 203   Recent Labs    03/18/17  CHOL 178  178  LDLCALC 110  TRIG 156  156   No results found for: MICROALBUR Lab Results  Component Value Date   TSH 0.25 (A) 03/18/2017   Lab Results  Component Value Date   HGBA1C 5.3 03/18/2017   Lab Results  Component Value Date   CHOL 178 03/18/2017   CHOL 178 03/18/2017   HDL 37 03/18/2017   LDLCALC 110 03/18/2017   TRIG 156 03/18/2017   TRIG 156 03/18/2017    Significant Diagnostic Results in last 30 days:  No results found.  Assessment and Plan  Vitamin D deficiency Stable; continue vitamin D replacement 1000 units daily  Overactive bladder No complaint of pain or recent infections; continue Ditropan 5 mg 3 times daily  Hypothyroidism Last TSH 0.25; Synthroid was decreased to 100 mcg daily; will repeat TSH in 8 weeks    Randon Goldsmith. [name], MD

## 2017-07-07 ENCOUNTER — Encounter: Payer: Self-pay | Admitting: Internal Medicine

## 2017-07-07 NOTE — Assessment & Plan Note (Signed)
Last TSH 0.25; Synthroid was decreased to 100 mcg daily; will repeat TSH in 8 weeks

## 2017-07-07 NOTE — Assessment & Plan Note (Signed)
Stable; continue vitamin D replacement 1000 units daily

## 2017-07-07 NOTE — Assessment & Plan Note (Signed)
No complaint of pain or recent infections; continue Ditropan 5 mg 3 times daily

## 2017-07-17 ENCOUNTER — Non-Acute Institutional Stay (SKILLED_NURSING_FACILITY): Payer: Medicare Other | Admitting: Internal Medicine

## 2017-07-17 ENCOUNTER — Encounter: Payer: Self-pay | Admitting: Internal Medicine

## 2017-07-17 DIAGNOSIS — F02818 Dementia in other diseases classified elsewhere, unspecified severity, with other behavioral disturbance: Secondary | ICD-10-CM

## 2017-07-17 DIAGNOSIS — G301 Alzheimer's disease with late onset: Secondary | ICD-10-CM

## 2017-07-17 DIAGNOSIS — F419 Anxiety disorder, unspecified: Secondary | ICD-10-CM | POA: Diagnosis not present

## 2017-07-17 DIAGNOSIS — J449 Chronic obstructive pulmonary disease, unspecified: Secondary | ICD-10-CM | POA: Diagnosis not present

## 2017-07-17 DIAGNOSIS — F0281 Dementia in other diseases classified elsewhere with behavioral disturbance: Secondary | ICD-10-CM | POA: Diagnosis not present

## 2017-07-17 NOTE — Progress Notes (Signed)
Location:  [facility] Room Number: 213W Place of Service:  SNF (31)  [name], MD  Patient Care Team: [name], MD as PCP - General (Internal Medicine)  Extended Emergency Contact Information Primary Emergency Contact: [name] [phone] [name] Home Phone: [phone] Relation: Niece Secondary Emergency Contact: Dorice Lam[place] Home Phone: [phone] Relation: Niece    Allergies: Azithromycin; Cephalexin; Cephalosporins; Ciprofloxacin; Darvon [propoxyphene]; Flagyl [metronidazole]; Librax [chlordiazepoxide-clidinium]; Macrodantin [nitrofurantoin macrocrystal]; Nitrofuran derivatives; Other; Oxycodone-acetaminophen; Percocet [oxycodone-acetaminophen]; Quetiapine; Sulfa antibiotics; and Sulfamethoxazole  Chief Complaint  Patient presents with  . Medical Management of Chronic Issues    Routine Visit    HPI: Patient is 77 y.o. female who is being seen for routine issues of COPD, anxiety, and dementia.  Past Medical History:  Diagnosis Date  . Acute encephalopathy 02/13/2016  . Anemia   . Anxiety   . Arthritis   . Asthma   . Barrett's esophagus   . Chronic kidney disease   . COPD (chronic obstructive pulmonary disease) (HCC)   . Dementia   . Dementia with behavioral disturbance 02/13/2016  . Depression   . Disease of thyroid gland   . Diverticulitis   . GERD (gastroesophageal reflux disease)   . Hyperlipemia   . Hypertension   . Memory difficulties   . Osteoporosis   . Pancreatitis   . Paroxysmal atrial fibrillation with RVR (HCC) 05/10/2016  . Psoriasis (a type of skin inflammation)   . Vertigo   . Vitamin D deficiency 08/31/2016    Past Surgical History:  Procedure Laterality Date  . BACK SURGERY    . brain shun    . CHOLECYSTECTOMY    . ROTATOR CUFF REPAIR    . SHOULDER SURGERY    . UPPER GI ENDOSCOPY  12/19/2015   [facility], Include esophagus,  stomach & duodenum or / Jejunum: Dx w/wo biopsys and dilatation; Surgeon : Leonia Reeves Rhoton MD    Allergies as of 07/17/2017      Reactions   Azithromycin Other (See Comments)   unknown   Cephalexin Other (See Comments)   unknown   Cephalosporins    Ciprofloxacin Other (See Comments)   unknown   Darvon [propoxyphene]    Flagyl [metronidazole] Other (See Comments)   unknown   Librax [chlordiazepoxide-clidinium] Other (See Comments)   unknown   Macrodantin [nitrofurantoin Macrocrystal]    Nitrofuran Derivatives Other (See Comments)   unknown   Other Other (See Comments)   unknown   Oxycodone-acetaminophen Other (See Comments)   unknown   Percocet [oxycodone-acetaminophen]    Quetiapine Other (See Comments)   unknown Potassium level dropped.   Sulfa Antibiotics    Sulfamethoxazole Other (See Comments)   unknown      Medication List        Accurate as of 07/17/17 11:59 PM. Always use your most recent med list.          acetaminophen 500 MG tablet Commonly known as:  TYLENOL Take 500 mg by mouth 2 (two) times daily as needed. pain   acetaminophen 325 MG tablet Commonly known as:  TYLENOL Take 650 mg by mouth every 8 (eight) hours as needed.   albuterol 108 (90 Base) MCG/ACT inhaler Commonly known as:  PROVENTIL HFA;VENTOLIN HFA Inhale 2 puffs into the lungs every 6 (six) hours as needed for wheezing.   cetirizine 10 MG tablet Commonly known as:  ZYRTEC Take 10 mg by mouth at bedtime.   clonazePAM 1 MG tablet Commonly known as:  KLONOPIN Give 1 mg by mouth at bedtime   D3-1000 1000 units tablet Generic drug:  Cholecalciferol Take 1,000 Units by mouth daily.   donepezil 10 MG tablet Commonly known as:  ARICEPT Take 10 mg by mouth at bedtime.   ipratropium-albuterol 0.5-2.5 (3) MG/3ML Soln Commonly known as:  DUONEB Take 3 mLs by nebulization every 4 (four) hours as needed.   levothyroxine 100 MCG tablet Commonly known as:  SYNTHROID, LEVOTHROID Take  100 mcg by mouth daily before breakfast.   oxybutynin 5 MG tablet Commonly known as:  DITROPAN Take 5 mg by mouth 3 (three) times daily.   pantoprazole 40 MG tablet Commonly known as:  PROTONIX Take 40 mg by mouth 2 (two) times daily.   ranitidine 75 MG tablet Commonly known as:  ZANTAC Take 75 mg by mouth at bedtime.   sodium chloride 0.65 % Soln nasal spray Commonly known as:  OCEAN Place 2 sprays into both nostrils 2 (two) times daily as needed. dry nasal membrane   theophylline 100 MG 12 hr tablet Commonly known as:  THEODUR Take 100 mg by mouth 2 (two) times daily. COPD   traMADol 50 MG tablet Commonly known as:  ULTRAM Take 50 mg by mouth 2 (two) times daily.   venlafaxine 37.5 MG tablet Commonly known as:  EFFEXOR Take 37.5 mg by mouth daily.       No orders of the defined types were placed in this encounter.   Immunization History  Administered Date(s) Administered  . Influenza, High Dose Seasonal PF 12/20/2014  . Influenza, Seasonal, Injecte, Preservative Fre 10/10/2014  . Influenza-Unspecified 12/07/2015, 11/21/2016  . PPD Test 02/10/2016, 02/26/2016  . Pneumococcal Conjugate-13 12/20/2014  . Pneumococcal Polysaccharide-23 02/07/2017  . Td 08/26/2014    Social History   Tobacco Use  . Smoking status: Former Smoker    Last attempt to quit: 12/17/1990    Years since quitting: 26.6  . Smokeless tobacco: Never Used  Substance Use Topics  . Alcohol use: No    Review of Systems  DATA OBTAINED: from patient-limited; nursing-no acute concerns GENERAL:  no fevers, fatigue, appetite changes SKIN: No itching, rash HEENT: No complaint RESPIRATORY: No cough, wheezing, SOB CARDIAC: No chest pain, palpitations, lower extremity edema  GI: No abdominal pain, No N/V/D or constipation, No heartburn or reflux  GU: No dysuria, frequency or urgency, or incontinence  MUSCULOSKELETAL: No unrelieved bone/joint pain NEUROLOGIC: No headache, dizziness  PSYCHIATRIC:  No overt anxiety or sadness  Vitals:   07/17/17 1046  BP: 120/70  Pulse: 78  Resp: 18  Temp: 98 F (36.7 C)  SpO2: 97%   Body mass index is 26.39 kg/m. Physical Exam  GENERAL APPEARANCE: Alert, conversant, No acute distress  SKIN: No diaphoresis rash HEENT: Unremarkable RESPIRATORY: Breathing is even, unlabored. Lung sounds are clear   CARDIOVASCULAR: Heart RRR no murmurs, rubs or gallops. No peripheral edema  GASTROINTESTINAL: Abdomen is soft, non-tender, not distended w/ normal bowel sounds.  GENITOURINARY: Bladder non tender, not distended  MUSCULOSKELETAL: No abnormal joints or musculature NEUROLOGIC: Cranial nerves 2-12 grossly intact. Moves all extremities PSYCHIATRIC: Mood and affect dementia, no behavioral issues  Patient Active Problem List   Diagnosis Date Noted  . Hypotension 08/31/2016  . Postural dizziness with presyncope 08/31/2016  . Chest pain 08/31/2016  . Paroxysmal atrial fibrillation (HCC) 08/31/2016  . Hypokalemia 08/31/2016  . Depression 08/31/2016  .  Vitamin D deficiency 08/31/2016  . Mood disorder (HCC) 08/22/2016  . PNA (pneumonia) 05/10/2016  . COPD exacerbation (HCC) 05/10/2016  . UTI (urinary tract infection) 05/10/2016  . Paroxysmal atrial fibrillation with RVR (HCC) 05/10/2016  . Metabolic encephalopathy 05/10/2016  . Elevated amylase and lipase 02/13/2016  . Acute encephalopathy 02/13/2016  . Psychosis (HCC) 02/13/2016  . Dementia with behavioral disturbance 02/13/2016  . Overactive bladder 02/13/2016  . Chronic kidney disease   . COPD (chronic obstructive pulmonary disease) (HCC)   . Anemia   . Anxiety   . Barrett's esophagus   . Hypothyroidism   . GERD (gastroesophageal reflux disease)   . Hypertension   . Memory difficulties   . Osteoporosis   . Psoriasis (a type of skin inflammation)     CMP     Component Value Date/Time   NA 143 03/18/2017   NA 143 03/18/2017   K 3.8 03/18/2017   K 3.8 03/18/2017   CL 100 (L)  01/26/2016 1935   CO2 30 01/26/2016 1935   GLUCOSE 115 (H) 01/26/2016 1935   BUN 12 03/18/2017   CREATININE 0.64 03/18/2017   CREATININE 0.6 03/18/2017   CALCIUM 9.2 03/18/2017   ALBUMIN 3.8 03/18/2017   AST 9 03/18/2017   AST 9 (A) 03/18/2017   ALT 7 03/18/2017   ALT 7 03/18/2017   ALKPHOS 99 03/18/2017   ALKPHOS 99 03/18/2017   BILITOT 0.2 03/18/2017   GFRNONAA 85.41 03/18/2017   GFRAA >60 01/26/2016 1935   Recent Labs    08/28/16 03/18/17  NA 139 143  143  K 3.4 3.8  3.8  BUN 7 12  CREATININE 1.0 0.6  0.64  CALCIUM  --  9.2   Recent Labs    08/23/16 03/18/17  AST 40* 9*  9  ALT 14 7  7   ALKPHOS 79 99  99  BILITOT  --  0.2  ALBUMIN  --  3.8   Recent Labs    08/26/16 03/18/17  WBC 6.3 5.6  5.6  HGB 11.5* 11.4*  11.4  HCT 35* 35*  34.9  PLT 171 203   Recent Labs    03/18/17  CHOL 178  178  LDLCALC 110  TRIG 156  156   No results found for: MICROALBUR Lab Results  Component Value Date   TSH 0.25 (A) 03/18/2017   Lab Results  Component Value Date   HGBA1C 5.3 03/18/2017   Lab Results  Component Value Date   CHOL 178 03/18/2017   CHOL 178 03/18/2017   HDL 37 03/18/2017   LDLCALC 110 03/18/2017   TRIG 156 03/18/2017   TRIG 156 03/18/2017    Significant Diagnostic Results in last 30 days:  No results found.  Assessment and Plan  COPD (chronic obstructive pulmonary disease) (HCC) No exacerbations reported; plan to continue theophylline 100 mg twice daily, PRN DuoNeb's and PRN albuterol  Anxiety Trolled; continue on diazepam 1 mg nightly  Dementia with behavioral disturbance Stable and mild; continue Aricept 10 mg daily     Barnet Benavides D. [name], MD

## 2017-07-31 ENCOUNTER — Non-Acute Institutional Stay (SKILLED_NURSING_FACILITY): Payer: Medicare Other

## 2017-07-31 DIAGNOSIS — Z0000 Encounter for general adult medical examination without abnormal findings: Secondary | ICD-10-CM | POA: Diagnosis not present

## 2017-07-31 NOTE — Progress Notes (Signed)
Subjective:   Rachel Dickerson is a 77 y.o. female who presents for Medicare Annual (Subsequent) preventive examination at [facility]  Last AWV-07/31/2016  Objective:     Vitals: BP (!) 108/52 (BP Location: Left Arm, Patient Position: Sitting)   Pulse 69   Temp 98 F (36.7 C) (Oral)   Ht 5\' 3"  (1.6 m)   Wt 149 lb (67.6 kg)   LMP  (LMP Unknown)   SpO2 94%   BMI 26.39 kg/m   Body mass index is 26.39 kg/m.  Advanced Directives 07/31/2017 07/17/2017 06/25/2017 06/03/2017 05/23/2017 05/20/2017 05/06/2017  Does Patient Have a Medical Advance Directive? Yes Yes Yes Yes Yes Yes Yes  Type of Advance Directive Out of facility DNR (pink MOST or yellow form) Out of facility DNR (pink MOST or yellow form) Out of facility DNR (pink MOST or yellow form) Out of facility DNR (pink MOST or yellow form) Out of facility DNR (pink MOST or yellow form) Out of facility DNR (pink MOST or yellow form) Out of facility DNR (pink MOST or yellow form)  Does patient want to make changes to medical advance directive? No - Patient declined - - No - Patient declined No - Patient declined No - Patient declined No - Patient declined  Copy of Healthcare Power of Attorney in Chart? - - - - - - -  Pre-existing out of facility DNR order (yellow form or pink MOST form) Yellow form placed in chart (order not valid for inpatient use) Yellow form placed in chart (order not valid for inpatient use) Yellow form placed in chart (order not valid for inpatient use) Yellow form placed in chart (order not valid for inpatient use) Yellow form placed in chart (order not valid for inpatient use) - Yellow form placed in chart (order not valid for inpatient use)    Tobacco Social History   Tobacco Use  Smoking Status Former Smoker  . Last attempt to quit: 12/17/1990  . Years since quitting: 26.6  Smokeless Tobacco Never Used     Counseling given: Not Answered   Clinical Intake:  Pre-visit preparation completed:  No  Pain : 0-10 Pain Score: 2  Pain Type: Acute pain Pain Location: Ankle Pain Orientation: Right, Left Pain Descriptors / Indicators: Sore Pain Onset: More than a month ago(from hitting the sides of her wheelchair) Pain Frequency: Intermittent     Nutritional Risks: None Diabetes: No     Interpreter Needed?: No  Information entered by :: Tyron Russell, RN  Past Medical History:  Diagnosis Date  . Acute encephalopathy 02/13/2016  . Anemia   . Anxiety   . Arthritis   . Asthma   . Barrett's esophagus   . Chronic kidney disease   . COPD (chronic obstructive pulmonary disease) (HCC)   . Dementia   . Dementia with behavioral disturbance 02/13/2016  . Depression   . Disease of thyroid gland   . Diverticulitis   . GERD (gastroesophageal reflux disease)   . Hyperlipemia   . Hypertension   . Memory difficulties   . Osteoporosis   . Pancreatitis   . Paroxysmal atrial fibrillation with RVR (HCC) 05/10/2016  . Psoriasis (a type of skin inflammation)   . Vertigo   . Vitamin D deficiency 08/31/2016   Past Surgical History:  Procedure Laterality Date  . BACK SURGERY    . brain shun    . CHOLECYSTECTOMY    . ROTATOR CUFF REPAIR    . SHOULDER SURGERY    .  UPPER GI ENDOSCOPY  12/19/2015   [facility] End[facility], Include esophagus, stomach & duodenum or / Jejunum: Dx w/wo biopsys and dilatation; Surgeon : Stasia CavalierAlbert John Rhoton MD   Family History  Problem Relation Age of Onset  . Arthritis Mother   . Heart disease Mother   . Hypertension Mother   . Cancer Father   . Arthritis Father   . Heart disease Father   . Alzheimer's disease Sister   . Arthritis Sister   . Heart disease Sister   . Memory loss Sister   . Cancer Sister   . Heart attack Sister   . Cancer Brother   . Lung disease Brother   . Heart disease Brother    Social History   Socioeconomic History  . Marital status: Widowed    Spouse name: Not on file  . Number of children: Not on file  . Years of education: Not on  file  . Highest education level: Not on file  Occupational History  . Not on file  Social Needs  . Financial resource strain: Not hard at all  . Food insecurity:    Worry: Never true    Inability: Never true  . Transportation needs:    Medical: No    Non-medical: No  Tobacco Use  . Smoking status: Former Smoker    Last attempt to quit: 12/17/1990    Years since quitting: 26.6  . Smokeless tobacco: Never Used  Substance and Sexual Activity  . Alcohol use: No  . Drug use: No  . Sexual activity: Never  Lifestyle  . Physical activity:    Days per week: 0 days    Minutes per session: 0 min  . Stress: To some extent  Relationships  . Social connections:    Talks on phone: Never    Gets together: Never    Attends religious service: Never    Active member of club or organization: No    Attends meetings of clubs or organizations: Never    Relationship status: Widowed  Other Topics Concern  . Not on file  Social History Narrative   [facility]/10/[facility]   Widowed   Former smoker-stopped 1992   Alcohol none   DNR    Outpatient Encounter Medications as of 07/31/2017  Medication Sig  . acetaminophen (TYLENOL) 325 MG tablet Take 650 mg by mouth every 8 (eight) hours as needed.   Marland Kitchen. acetaminophen (TYLENOL) 500 MG tablet Take 500 mg by mouth 2 (two) times daily as needed. pain  . albuterol (PROVENTIL HFA;VENTOLIN HFA) 108 (90 Base) MCG/ACT inhaler Inhale 2 puffs into the lungs every 6 (six) hours as needed for wheezing.   . cetirizine (ZYRTEC) 10 MG tablet Take 10 mg by mouth at bedtime.  . Cholecalciferol (D3-1000) 1000 units tablet Take 1,000 Units by mouth daily.   . clonazePAM (KLONOPIN) 1 MG tablet Give 1 mg by mouth at bedtime  . donepezil (ARICEPT) 10 MG tablet Take 10 mg by mouth at bedtime.  Marland Kitchen. ipratropium-albuterol (DUONEB) 0.5-2.5 (3) MG/3ML SOLN Take 3 mLs by nebulization every 4 (four) hours as needed.   Marland Kitchen. levothyroxine (SYNTHROID, LEVOTHROID)  100 MCG tablet Take 100 mcg by mouth daily before breakfast.  . oxybutynin (DITROPAN) 5 MG tablet Take 5 mg by mouth 3 (three) times daily.  . pantoprazole (PROTONIX) 40 MG tablet Take 40 mg by mouth 2 (two) times daily.   . ranitidine (ZANTAC) 75 MG tablet Take 75 mg by mouth at  bedtime.   . sodium chloride (OCEAN) 0.65 % SOLN nasal spray Place 2 sprays into both nostrils 2 (two) times daily as needed. dry nasal membrane  . theophylline (THEODUR) 100 MG 12 hr tablet Take 100 mg by mouth 2 (two) times daily. COPD  . traMADol (ULTRAM) 50 MG tablet Take 50 mg by mouth 2 (two) times daily.  Marland Kitchen venlafaxine (EFFEXOR) 37.5 MG tablet Take 37.5 mg by mouth daily.    No facility-administered encounter medications on file as of 07/31/2017.     Activities of Daily Living In your present state of health, do you have any difficulty performing the following activities: 07/31/2017 07/31/2016  Hearing? N N  Vision? Y N  Comment macular degeneration -  Difficulty concentrating or making decisions? Malvin Johns  Walking or climbing stairs? Y Y  Dressing or bathing? Y Y  Doing errands, shopping? Malvin Johns  Preparing Food and eating ? Y Y  Using the Toilet? Y Y  In the past six months, have you accidently leaked urine? Y Y  Do you have problems with loss of bowel control? Y Y  Managing your Medications? Y Y  Managing your Finances? Malvin Johns  Housekeeping or managing your Housekeeping? Malvin Johns  Some recent data might be hidden    Patient Care Team: [name], MD as PCP - General (Internal Medicine)    Assessment:   This is a routine wellness examination for [place].  Exercise Activities and Dietary recommendations Current Exercise Habits: The patient does not participate in regular exercise at present, Exercise limited by: neurologic condition(s)  Goals    None      Fall Risk Fall Risk  07/31/2017 07/31/2016  Falls in the past year? No Yes  Number falls in past yr: - 2 or more  Injury with Fall? - No   Is  the patient's home free of loose throw rugs in walkways, pet beds, electrical cords, etc?   yes      Grab bars in the bathroom? yes      Handrails on the stairs?   yes      Adequate lighting?   yes   Depression Screen PHQ 2/9 Scores 07/31/2017 07/31/2016  PHQ - 2 Score 0 2  PHQ- 9 Score - 7     Cognitive Function     6CIT Screen 07/31/2017 07/31/2016  What Year? 4 points 0 points  What month? 3 points 0 points  What time? 0 points 0 points  Count back from 20 0 points 0 points  Months in reverse 4 points 4 points  Repeat phrase 4 points 6 points  Total Score 15 10    Immunization History  Administered Date(s) Administered  . Influenza, High Dose Seasonal PF 12/20/2014  . Influenza, Seasonal, Injecte, Preservative Fre 10/10/2014  . Influenza-Unspecified 12/07/2015, 11/21/2016  . PPD Test 02/10/2016, 02/26/2016  . Pneumococcal Conjugate-13 12/20/2014  . Pneumococcal Polysaccharide-23 02/07/2017  . Td 08/26/2014    Qualifies for Shingles Vaccine? Not in past records  Screening Tests Health Maintenance  Topic Date Due  . INFLUENZA VACCINE  09/11/2017  . TETANUS/TDAP  08/25/2024  . DEXA SCAN  Completed  . PNA vac Low Risk Adult  Completed    Cancer Screenings: Lung: Low Dose CT Chest recommended if Age 46-80 years, 30 pack-year currently smoking OR have quit w/in 15years. Patient does not qualify. Breast:  Up to date on Mammogram? Yes   Up to date of Bone Density/Dexa? Yes Colorectal: up to date  Additional Screenings:  Hepatitis C Screening: declined     Plan:    I have personally reviewed and addressed the Medicare Annual Wellness questionnaire and have noted the following in the patient's chart:  A. Medical and social history B. Use of alcohol, tobacco or illicit drugs  C. Current medications and supplements D. Functional ability and status E.  Nutritional status F.  Physical activity G. Advance directives H. List of other physicians I.  Hospitalizations,  surgeries, and ER visits in previous 12 months J.  Vitals K. Screenings to include hearing, vision, cognitive, depression L. Referrals and appointments - none  In addition, I have reviewed and discussed with patient certain preventive protocols, quality metrics, and best practice recommendations. A written personalized care plan for preventive services as well as general preventive health recommendations were provided to patient.  See attached scanned questionnaire for additional information.   Signed,   Tyron Russell, RN Nurse Health Advisor  Patient Concerns: None

## 2017-07-31 NOTE — Patient Instructions (Signed)
[name] , Thank you for taking time to come for your Medicare Wellness Visit. I appreciate your ongoing commitment to your health goals. Please review the following plan we discussed and let me know if I can assist you in the future.   Screening recommendations/referrals: Colonoscopy excluded, over age 79 Mammogram excluded, over age 79 Bone Density up to date Recommended yearly ophthalmology/optometry visit for glaucoma screening and checkup Recommended yearly dental visit for hygiene and checkup  Vaccinations: Influenza vaccine up to date, due 2019 fall season Pneumococcal vaccine up to date, completed Tdap vaccine up to date, due 08/25/2024 Shingles vaccine not in past records    Advanced directives: in chart  Conditions/risks identified: none  Next appointment: Dr. Lyn Dickerson makes rounds   Preventive Care 65 Years and Older, Female Preventive care refers to lifestyle choices and visits with your health care provider that can promote health and wellness. What does preventive care include?  A yearly physical exam. This is also called an annual well check.  Dental exams once or twice a year.  Routine eye exams. Ask your health care provider how often you should have your eyes checked.  Personal lifestyle choices, including:  Daily care of your teeth and gums.  Regular physical activity.  Eating a healthy diet.  Avoiding tobacco and drug use.  Limiting alcohol use.  Practicing safe sex.  Taking low-dose aspirin every day.  Taking vitamin and mineral supplements as recommended by your health care provider. What happens during an annual well check? The services and screenings done by your health care provider during your annual well check will depend on your age, overall health, lifestyle risk factors, and family history of disease. Counseling  Your health care provider may ask you questions about your:  Alcohol use.  Tobacco use.  Drug use.  Emotional  well-being.  Home and relationship well-being.  Sexual activity.  Eating habits.  History of falls.  Memory and ability to understand (cognition).  Work and work Astronomerenvironment.  Reproductive health. Screening  You may have the following tests or measurements:  Height, weight, and BMI.  Blood pressure.  Lipid and cholesterol levels. These may be checked every 5 years, or more frequently if you are over 79 years old.  Skin check.  Lung cancer screening. You may have this screening every year starting at age 79 if you have a 30-pack-year history of smoking and currently smoke or have quit within the past 15 years.  Fecal occult blood test (FOBT) of the stool. You may have this test every year starting at age 79.  Flexible sigmoidoscopy or colonoscopy. You may have a sigmoidoscopy every 5 years or a colonoscopy every 10 years starting at age 79.  Hepatitis C blood test.  Hepatitis B blood test.  Sexually transmitted disease (STD) testing.  Diabetes screening. This is done by checking your blood sugar (glucose) after you have not eaten for a while (fasting). You may have this done every 1-3 years.  Bone density scan. This is done to screen for osteoporosis. You may have this done starting at age 79.  Mammogram. This may be done every 1-2 years. Talk to your health care provider about how often you should have regular mammograms. Talk with your health care provider about your test results, treatment options, and if necessary, the need for more tests. Vaccines  Your health care provider may recommend certain vaccines, such as:  Influenza vaccine. This is recommended every year.  Tetanus, diphtheria, and acellular pertussis (Tdap,  Td) vaccine. You may need a Td booster every 10 years.  Zoster vaccine. You may need this after age 20.  Pneumococcal 13-valent conjugate (PCV13) vaccine. One dose is recommended after age 70.  Pneumococcal polysaccharide (PPSV23) vaccine. One  dose is recommended after age 52. Talk to your health care provider about which screenings and vaccines you need and how often you need them. This information is not intended to replace advice given to you by your health care provider. Make sure you discuss any questions you have with your health care provider. Document Released: 02/24/2015 Document Revised: 10/18/2015 Document Reviewed: 11/29/2014 Elsevier Interactive Patient Education  2017 [employer] Prevention in the Home Falls can cause injuries. They can happen to people of all ages. There are many things you can do to make your home safe and to help prevent falls. What can I do on the outside of my home?  Regularly fix the edges of walkways and driveways and fix any cracks.  Remove anything that might make you trip as you walk through a door, such as a raised step or threshold.  Trim any bushes or trees on the path to your home.  Use bright outdoor lighting.  Clear any walking paths of anything that might make someone trip, such as rocks or tools.  Regularly check to see if handrails are loose or broken. Make sure that both sides of any steps have handrails.  Any raised decks and porches should have guardrails on the edges.  Have any leaves, snow, or ice cleared regularly.  Use sand or salt on walking paths during winter.  Clean up any spills in your garage right away. This includes oil or grease spills. What can I do in the bathroom?  Use night lights.  Install grab bars by the toilet and in the tub and shower. Do not use towel bars as grab bars.  Use non-skid mats or decals in the tub or shower.  If you need to sit down in the shower, use a plastic, non-slip stool.  Keep the floor dry. Clean up any water that spills on the floor as soon as it happens.  Remove soap buildup in the tub or shower regularly.  Attach bath mats securely with double-sided non-slip rug tape.  Do not have throw rugs and other  things on the floor that can make you trip. What can I do in the bedroom?  Use night lights.  Make sure that you have a light by your bed that is easy to reach.  Do not use any sheets or blankets that are too big for your bed. They should not hang down onto the floor.  Have a firm chair that has side arms. You can use this for support while you get dressed.  Do not have throw rugs and other things on the floor that can make you trip. What can I do in the kitchen?  Clean up any spills right away.  Avoid walking on wet floors.  Keep items that you use a lot in easy-to-reach places.  If you need to reach something above you, use a strong step stool that has a grab bar.  Keep electrical cords out of the way.  Do not use floor polish or wax that makes floors slippery. If you must use wax, use non-skid floor wax.  Do not have throw rugs and other things on the floor that can make you trip. What can I do with my stairs?  Do not  leave any items on the stairs.  Make sure that there are handrails on both sides of the stairs and use them. Fix handrails that are broken or loose. Make sure that handrails are as long as the stairways.  Check any carpeting to make sure that it is firmly attached to the stairs. Fix any carpet that is loose or worn.  Avoid having throw rugs at the top or bottom of the stairs. If you do have throw rugs, attach them to the floor with carpet tape.  Make sure that you have a light switch at the top of the stairs and the bottom of the stairs. If you do not have them, ask someone to add them for you. What else can I do to help prevent falls?  Wear shoes that:  Do not have high heels.  Have rubber bottoms.  Are comfortable and fit you well.  Are closed at the toe. Do not wear sandals.  If you use a stepladder:  Make sure that it is fully opened. Do not climb a closed stepladder.  Make sure that both sides of the stepladder are locked into place.  Ask  someone to hold it for you, if possible.  Clearly mark and make sure that you can see:  Any grab bars or handrails.  First and last steps.  Where the edge of each step is.  Use tools that help you move around (mobility aids) if they are needed. These include:  Canes.  Walkers.  Scooters.  Crutches.  Turn on the lights when you go into a dark area. Replace any light bulbs as soon as they burn out.  Set up your furniture so you have a clear path. Avoid moving your furniture around.  If any of your floors are uneven, fix them.  If there are any pets around you, be aware of where they are.  Review your medicines with your doctor. Some medicines can make you feel dizzy. This can increase your chance of falling. Ask your doctor what other things that you can do to help prevent falls. This information is not intended to replace advice given to you by your health care provider. Make sure you discuss any questions you have with your health care provider. Document Released: 11/24/2008 Document Revised: 07/06/2015 Document Reviewed: 03/04/2014 Elsevier Interactive Patient Education  2017 [employer].

## 2017-08-10 ENCOUNTER — Encounter: Payer: Self-pay | Admitting: Internal Medicine

## 2017-08-10 NOTE — Assessment & Plan Note (Signed)
Stable and mild; continue Aricept 10 mg daily

## 2017-08-10 NOTE — Assessment & Plan Note (Signed)
Trolled; continue on diazepam 1 mg nightly

## 2017-08-10 NOTE — Assessment & Plan Note (Signed)
No exacerbations reported; plan to continue theophylline 100 mg twice daily, PRN DuoNeb's and PRN albuterol

## 2017-08-13 ENCOUNTER — Encounter: Payer: Self-pay | Admitting: Internal Medicine

## 2017-08-13 ENCOUNTER — Non-Acute Institutional Stay (SKILLED_NURSING_FACILITY): Payer: Medicare Other | Admitting: Internal Medicine

## 2017-08-13 DIAGNOSIS — I480 Paroxysmal atrial fibrillation: Secondary | ICD-10-CM | POA: Diagnosis not present

## 2017-08-13 DIAGNOSIS — E559 Vitamin D deficiency, unspecified: Secondary | ICD-10-CM | POA: Diagnosis not present

## 2017-08-13 DIAGNOSIS — F32A Depression, unspecified: Secondary | ICD-10-CM

## 2017-08-13 DIAGNOSIS — F329 Major depressive disorder, single episode, unspecified: Secondary | ICD-10-CM

## 2017-08-13 NOTE — Progress Notes (Signed)
Location:  [facility] Room Number: 213-W Place of Service:  SNF ([id number])  [name], MD  Patient Care Team: [name], MD as PCP - General (Internal Medicine)  Extended Emergency Contact Information Primary Emergency Contact: [name] [phone] [name] Home Phone: [phone] Relation: Niece Secondary Emergency Contact: Dorice Lamas [place] Home Phone: [phone] Relation: Niece    Allergies: Azithromycin; Cephalexin; Cephalosporins; Ciprofloxacin; Darvon [propoxyphene]; Flagyl [metronidazole]; Librax [chlordiazepoxide-clidinium]; Macrodantin [nitrofurantoin macrocrystal]; Nitrofuran derivatives; Other; Oxycodone-acetaminophen; Percocet [oxycodone-acetaminophen]; Quetiapine; Sulfa antibiotics; and Sulfamethoxazole  Chief Complaint  Patient presents with  . Medical Management of Chronic Issues    Routine Visit    HPI: Patient is 77 y.o. female who is being seen for routine issues of vitamin D deficiency, depression, and paroxysmal atrial fib.  Past Medical History:  Diagnosis Date  . Acute encephalopathy 02/13/2016  . Anemia   . Anxiety   . Arthritis   . Asthma   . Barrett's esophagus   . Chronic kidney disease   . COPD (chronic obstructive pulmonary disease) (HCC)   . Dementia   . Dementia with behavioral disturbance 02/13/2016  . Depression   . Disease of thyroid gland   . Diverticulitis   . GERD (gastroesophageal reflux disease)   . Hyperlipemia   . Hypertension   . Memory difficulties   . Osteoporosis   . Pancreatitis   . Paroxysmal atrial fibrillation with RVR (HCC) 05/10/2016  . Psoriasis (a type of skin inflammation)   . Vertigo   . Vitamin D deficiency 08/31/2016    Past Surgical History:  Procedure Laterality Date  . BACK SURGERY    . brain shun    . CHOLECYSTECTOMY    . ROTATOR CUFF REPAIR    . SHOULDER SURGERY    . UPPER GI ENDOSCOPY  12/19/2015     [facility], Include esophagus, stomach & duodenum or / Jejunum: Dx w/wo biopsys and dilatation; Surgeon : Leonia Reeves Rhoton MD    Allergies as of 08/13/2017      Reactions   Azithromycin Other (See Comments)   unknown   Cephalexin Other (See Comments)   unknown   Cephalosporins    Ciprofloxacin Other (See Comments)   unknown   Darvon [propoxyphene]    Flagyl [metronidazole] Other (See Comments)   unknown   Librax [chlordiazepoxide-clidinium] Other (See Comments)   unknown   Macrodantin [nitrofurantoin Macrocrystal]    Nitrofuran Derivatives Other (See Comments)   unknown   Other Other (See Comments)   unknown   Oxycodone-acetaminophen Other (See Comments)   unknown   Percocet [oxycodone-acetaminophen]    Quetiapine Other (See Comments)   unknown Potassium level dropped.   Sulfa Antibiotics    Sulfamethoxazole Other (See Comments)   unknown      Medication List        Accurate as of 08/13/17 11:59 PM. Always use your most recent med list.          acetaminophen 500 MG tablet Commonly known as:  TYLENOL Take 500 mg by mouth 2 (two) times daily as needed. pain   acetaminophen 325 MG tablet Commonly known as:  TYLENOL Take 650 mg by mouth every 8 (eight) hours as needed.   albuterol 108 (90 Base) MCG/ACT inhaler Commonly known as:  PROVENTIL HFA;VENTOLIN HFA Inhale 2 puffs into the lungs every 6 (six) hours as needed for wheezing.   cetirizine  10 MG tablet Commonly known as:  ZYRTEC Take 10 mg by mouth at bedtime.   clonazePAM 1 MG tablet Commonly known as:  KLONOPIN Give 1 mg by mouth at bedtime   D3-1000 1000 units tablet Generic drug:  Cholecalciferol Take 1,000 Units by mouth daily.   donepezil 10 MG tablet Commonly known as:  ARICEPT Take 10 mg by mouth at bedtime.   ipratropium-albuterol 0.5-2.5 (3) MG/3ML Soln Commonly known as:  DUONEB Take 3 mLs by nebulization every 4 (four) hours as needed.   levothyroxine 100 MCG tablet Commonly known  as:  SYNTHROID, LEVOTHROID Take 100 mcg by mouth daily before breakfast.   oxybutynin 5 MG tablet Commonly known as:  DITROPAN Take 5 mg by mouth 3 (three) times daily.   pantoprazole 40 MG tablet Commonly known as:  PROTONIX Take 40 mg by mouth 2 (two) times daily.   ranitidine 75 MG tablet Commonly known as:  ZANTAC Take 75 mg by mouth at bedtime.   sodium chloride 0.65 % Soln nasal spray Commonly known as:  OCEAN Place 2 sprays into both nostrils 2 (two) times daily as needed. dry nasal membrane   theophylline 100 MG 12 hr tablet Commonly known as:  THEODUR Take 100 mg by mouth 2 (two) times daily. COPD   traMADol 50 MG tablet Commonly known as:  ULTRAM Take 50 mg by mouth 2 (two) times daily.   venlafaxine 37.5 MG tablet Commonly known as:  EFFEXOR Take 37.5 mg by mouth daily.       No orders of the defined types were placed in this encounter.   Immunization History  Administered Date(s) Administered  . Influenza, High Dose Seasonal PF 12/20/2014  . Influenza, Seasonal, Injecte, Preservative Fre 10/10/2014  . Influenza-Unspecified 12/07/2015, 11/21/2016  . PPD Test 02/10/2016, 02/26/2016  . Pneumococcal Conjugate-13 12/20/2014  . Pneumococcal Polysaccharide-23 02/07/2017  . Td 08/26/2014    Social History   Tobacco Use  . Smoking status: Former Smoker    Last attempt to quit: 12/17/1990    Years since quitting: 26.7  . Smokeless tobacco: Never Used  Substance Use Topics  . Alcohol use: No    Review of Systems  DATA OBTAINED: from patient GENERAL:  no fevers, fatigue, appetite changes SKIN: No itching, rash HEENT: No complaint RESPIRATORY: No cough, wheezing, SOB CARDIAC: No chest pain, palpitations, lower extremity edema  GI: No abdominal pain, No N/V/D or constipation, No heartburn or reflux  GU: No dysuria, frequency or urgency, or incontinence  MUSCULOSKELETAL: No unrelieved bone/joint pain NEUROLOGIC: No headache, dizziness  PSYCHIATRIC:  No overt anxiety or sadness  Vitals:   08/13/17 1202  BP: 119/71  Pulse: 64  Resp: 18  Temp: (!) 97 F (36.1 C)   Body mass index is 26.5 kg/m. Physical Exam  GENERAL APPEARANCE: Alert, conversant, No acute distress  SKIN: No diaphoresis rash HEENT: Unremarkable RESPIRATORY: Breathing is even, unlabored. Lung sounds are clear   CARDIOVASCULAR: Heart RRR no murmurs, rubs or gallops. No peripheral edema  GASTROINTESTINAL: Abdomen is soft, non-tender, not distended w/ normal bowel sounds.  GENITOURINARY: Bladder non tender, not distended  MUSCULOSKELETAL: No abnormal joints or musculature NEUROLOGIC: Cranial nerves 2-12 grossly intact. Moves all extremities PSYCHIATRIC: Mood and affect appropriate to situation, no behavioral issues  Patient Active Problem List   Diagnosis Date Noted  . Hypotension 08/31/2016  . Postural dizziness with presyncope 08/31/2016  . Chest pain 08/31/2016  . Paroxysmal atrial fibrillation (HCC) 08/31/2016  . Hypokalemia 08/31/2016  .  Depression 08/31/2016  . Vitamin D deficiency 08/31/2016  . Mood disorder (HCC) 08/22/2016  . PNA (pneumonia) 05/10/2016  . COPD exacerbation (HCC) 05/10/2016  . UTI (urinary tract infection) 05/10/2016  . Paroxysmal atrial fibrillation with RVR (HCC) 05/10/2016  . Metabolic encephalopathy 05/10/2016  . Elevated amylase and lipase 02/13/2016  . Acute encephalopathy 02/13/2016  . Psychosis (HCC) 02/13/2016  . Dementia with behavioral disturbance 02/13/2016  . Overactive bladder 02/13/2016  . Chronic kidney disease   . COPD (chronic obstructive pulmonary disease) (HCC)   . Anemia   . Anxiety   . Barrett's esophagus   . Hypothyroidism   . GERD (gastroesophageal reflux disease)   . Hypertension   . Memory difficulties   . Osteoporosis   . Psoriasis (a type of skin inflammation)     CMP     Component Value Date/Time   NA 143 03/18/2017   NA 143 03/18/2017   K 3.8 03/18/2017   K 3.8 03/18/2017   CL 100 (L)  01/26/2016 1935   CO2 30 01/26/2016 1935   GLUCOSE 115 (H) 01/26/2016 1935   BUN 12 03/18/2017   CREATININE 0.64 03/18/2017   CREATININE 0.6 03/18/2017   CALCIUM 9.2 03/18/2017   ALBUMIN 3.8 03/18/2017   AST 9 03/18/2017   AST 9 (A) 03/18/2017   ALT 7 03/18/2017   ALT 7 03/18/2017   ALKPHOS 99 03/18/2017   ALKPHOS 99 03/18/2017   BILITOT 0.2 03/18/2017   GFRNONAA 85.41 03/18/2017   GFRAA >60 01/26/2016 1935   Recent Labs    08/28/16 03/18/17  NA 139 143  143  K 3.4 3.8  3.8  BUN 7 12  CREATININE 1.0 0.6  0.64  CALCIUM  --  9.2   Recent Labs    03/18/17  AST 9*  9  ALT 7  7  ALKPHOS 99  99  BILITOT 0.2  ALBUMIN 3.8   Recent Labs    03/18/17  WBC 5.6  5.6  HGB 11.4*  11.4  HCT 35*  34.9  PLT 203   Recent Labs    03/18/17  CHOL 178  178  LDLCALC 110  TRIG 156  156   No results found for: MICROALBUR Lab Results  Component Value Date   TSH 0.25 (A) 03/18/2017   Lab Results  Component Value Date   HGBA1C 5.3 03/18/2017   Lab Results  Component Value Date   CHOL 178 03/18/2017   CHOL 178 03/18/2017   HDL 37 03/18/2017   LDLCALC 110 03/18/2017   TRIG 156 03/18/2017   TRIG 156 03/18/2017    Significant Diagnostic Results in last 30 days:  No results found.  Assessment and Plan  Vitamin D deficiency Vitamin D level is 29.92; continue replacement at 1000 units daily  Depression Very good control on Effexor 37.5 mg daily; continue current therapy  Paroxysmal atrial fibrillation (HCC) Stays in normal sinus rhythm: This must of been a one-time event, as have not seen her on rate control medication or prophylaxis; monitor     Thurston Hole D. [name], MD

## 2017-08-27 ENCOUNTER — Encounter: Payer: Self-pay | Admitting: Internal Medicine

## 2017-08-27 NOTE — Assessment & Plan Note (Signed)
Very good control on Effexor 37.5 mg daily; continue current therapy

## 2017-08-27 NOTE — Assessment & Plan Note (Signed)
Vitamin D level is 29.92; continue replacement at 1000 units daily

## 2017-08-27 NOTE — Assessment & Plan Note (Signed)
Stays in normal sinus rhythm: This must of been a one-time event, as have not seen her on rate control medication or prophylaxis; monitor

## 2017-09-19 ENCOUNTER — Non-Acute Institutional Stay (SKILLED_NURSING_FACILITY): Payer: Medicare Other | Admitting: Internal Medicine

## 2017-09-19 ENCOUNTER — Encounter: Payer: Self-pay | Admitting: Internal Medicine

## 2017-09-19 DIAGNOSIS — N3281 Overactive bladder: Secondary | ICD-10-CM

## 2017-09-19 DIAGNOSIS — K2100 Gastro-esophageal reflux disease with esophagitis, without bleeding: Secondary | ICD-10-CM

## 2017-09-19 DIAGNOSIS — I10 Essential (primary) hypertension: Secondary | ICD-10-CM

## 2017-09-19 NOTE — Progress Notes (Signed)
Location:  [facility] Room Number: [address] Place of Service:  SNF [phone])  Randon Goldsmith. [name], MD  Patient Care Team: [name], MD as PCP - General (Internal Medicine)  Extended Emergency Contact Information Primary Emergency Contact: [name] [phone] [name] Home Phone: [phone] Relation: Niece Secondary Emergency Contact: Dorice Lamas [place] Home Phone: [phone] Relation: Niece    Allergies: Azithromycin; Cephalexin; Cephalosporins; Ciprofloxacin; Darvon [propoxyphene]; Flagyl [metronidazole]; Librax [chlordiazepoxide-clidinium]; Macrodantin [nitrofurantoin macrocrystal]; Nitrofuran derivatives; Other; Oxycodone-acetaminophen; Percocet [oxycodone-acetaminophen]; Quetiapine; Sulfa antibiotics; and Sulfamethoxazole  Chief Complaint  Patient presents with  . Medical Management of Chronic Issues    Routine Visit    HPI: Patient is 77 y.o. female who is being seen for routine issues of overactive bladder, hypertension, and GERD.  Past Medical History:  Diagnosis Date  . Acute encephalopathy 02/13/2016  . Anemia   . Anxiety   . Arthritis   . Asthma   . Barrett's esophagus   . Chronic kidney disease   . COPD (chronic obstructive pulmonary disease) (HCC)   . Dementia   . Dementia with behavioral disturbance 02/13/2016  . Depression   . Disease of thyroid gland   . Diverticulitis   . GERD (gastroesophageal reflux disease)   . Hyperlipemia   . Hypertension   . Memory difficulties   . Osteoporosis   . Pancreatitis   . Paroxysmal atrial fibrillation with RVR (HCC) 05/10/2016  . Psoriasis (a type of skin inflammation)   . Vertigo   . Vitamin D deficiency 08/31/2016    Past Surgical History:  Procedure Laterality Date  . BACK SURGERY    . brain shun    . CHOLECYSTECTOMY    . ROTATOR CUFF REPAIR    . SHOULDER SURGERY    . UPPER GI ENDOSCOPY  12/19/2015   [facility],  Include esophagus, stomach & duodenum or / Jejunum: Dx w/wo biopsys and dilatation; Surgeon : Leonia Reeves Rhoton MD    Allergies as of 09/19/2017      Reactions   Azithromycin Other (See Comments)   unknown   Cephalexin Other (See Comments)   unknown   Cephalosporins    Ciprofloxacin Other (See Comments)   unknown   Darvon [propoxyphene]    Flagyl [metronidazole] Other (See Comments)   unknown   Librax [chlordiazepoxide-clidinium] Other (See Comments)   unknown   Macrodantin [nitrofurantoin Macrocrystal]    Nitrofuran Derivatives Other (See Comments)   unknown   Other Other (See Comments)   unknown   Oxycodone-acetaminophen Other (See Comments)   unknown   Percocet [oxycodone-acetaminophen]    Quetiapine Other (See Comments)   unknown Potassium level dropped.   Sulfa Antibiotics    Sulfamethoxazole Other (See Comments)   unknown      Medication List        Accurate as of 09/19/17 11:59 PM. Always use your most recent med list.          acetaminophen 500 MG tablet Commonly known as:  TYLENOL Take 500 mg by mouth 2 (two) times daily as needed. pain   acetaminophen 325 MG tablet Commonly known as:  TYLENOL Take 650 mg by mouth every 8 (eight) hours as needed.   albuterol 108 (90 Base) MCG/ACT inhaler Commonly known as:  PROVENTIL HFA;VENTOLIN HFA Inhale 2 puffs into the lungs every 6 (six) hours as needed for wheezing.   cetirizine 10 MG tablet Commonly  known as:  ZYRTEC Take 10 mg by mouth at bedtime.   clonazePAM 1 MG tablet Commonly known as:  KLONOPIN Give 1 mg by mouth at bedtime   D3-1000 1000 units tablet Generic drug:  Cholecalciferol Take 1,000 Units by mouth daily.   donepezil 10 MG tablet Commonly known as:  ARICEPT Take 10 mg by mouth at bedtime.   ipratropium-albuterol 0.5-2.5 (3) MG/3ML Soln Commonly known as:  DUONEB Take 3 mLs by nebulization every 4 (four) hours as needed.   levothyroxine 100 MCG tablet Commonly known as:  SYNTHROID,  LEVOTHROID Take 100 mcg by mouth daily before breakfast.   oxybutynin 5 MG tablet Commonly known as:  DITROPAN Take 5 mg by mouth 3 (three) times daily.   pantoprazole 40 MG tablet Commonly known as:  PROTONIX Take 40 mg by mouth 2 (two) times daily.   ranitidine 75 MG tablet Commonly known as:  ZANTAC Take 75 mg by mouth at bedtime.   sodium chloride 0.65 % Soln nasal spray Commonly known as:  OCEAN Place 2 sprays into both nostrils 2 (two) times daily as needed. dry nasal membrane   theophylline 100 MG 12 hr tablet Commonly known as:  THEODUR Take 100 mg by mouth 2 (two) times daily. COPD   traMADol 50 MG tablet Commonly known as:  ULTRAM Take 50 mg by mouth 2 (two) times daily.   venlafaxine 37.5 MG tablet Commonly known as:  EFFEXOR Take 37.5 mg by mouth daily.       No orders of the defined types were placed in this encounter.   Immunization History  Administered Date(s) Administered  . Influenza, High Dose Seasonal PF 12/20/2014  . Influenza, Seasonal, Injecte, Preservative Fre 10/10/2014  . Influenza-Unspecified 12/07/2015, 11/21/2016  . PPD Test 02/10/2016, 02/26/2016  . Pneumococcal Conjugate-13 12/20/2014  . Pneumococcal Polysaccharide-23 02/07/2017  . Td 08/26/2014    Social History   Tobacco Use  . Smoking status: Former Smoker    Last attempt to quit: 12/17/1990    Years since quitting: 26.8  . Smokeless tobacco: Never Used  Substance Use Topics  . Alcohol use: No    Review of Systems  DATA OBTAINED: from patient, nurse GENERAL:  no fevers, fatigue, appetite changes SKIN: No itching, rash HEENT: No complaint RESPIRATORY: No cough, wheezing, SOB CARDIAC: No chest pain, palpitations, lower extremity edema  GI: No abdominal pain, No N/V/D or constipation, No heartburn or reflux  GU: No dysuria, frequency or urgency, or incontinence  MUSCULOSKELETAL: No unrelieved bone/joint pain NEUROLOGIC: No headache, dizziness  PSYCHIATRIC: No overt  anxiety or sadness  Vitals:   09/19/17 1307  BP: 120/71  Pulse: 95  Resp: 18  Temp: (!) 97.4 F (36.3 C)   Body mass index is 26.57 kg/m. Physical Exam  GENERAL APPEARANCE: Alert, conversant, No acute distress  SKIN: No diaphoresis rash HEENT: Unremarkable RESPIRATORY: Breathing is even, unlabored. Lung sounds are clear   CARDIOVASCULAR: Heart RRR no murmurs, rubs or gallops. No peripheral edema  GASTROINTESTINAL: Abdomen is soft, non-tender, not distended w/ normal bowel sounds.  GENITOURINARY: Bladder non tender, not distended  MUSCULOSKELETAL: No abnormal joints or musculature NEUROLOGIC: Cranial nerves 2-12 grossly intact. Moves all extremities PSYCHIATRIC: Mood and affect appropriate to situation, no behavioral issues  Patient Active Problem List   Diagnosis Date Noted  . Hypotension 08/31/2016  . Postural dizziness with presyncope 08/31/2016  . Chest pain 08/31/2016  . Paroxysmal atrial fibrillation (HCC) 08/31/2016  . Hypokalemia 08/31/2016  . Depression 08/31/2016  .  Vitamin D deficiency 08/31/2016  . Mood disorder (HCC) 08/22/2016  . PNA (pneumonia) 05/10/2016  . COPD exacerbation (HCC) 05/10/2016  . UTI (urinary tract infection) 05/10/2016  . Paroxysmal atrial fibrillation with RVR (HCC) 05/10/2016  . Metabolic encephalopathy 05/10/2016  . Elevated amylase and lipase 02/13/2016  . Acute encephalopathy 02/13/2016  . Psychosis (HCC) 02/13/2016  . Dementia with behavioral disturbance 02/13/2016  . Overactive bladder 02/13/2016  . Chronic kidney disease   . COPD (chronic obstructive pulmonary disease) (HCC)   . Anemia   . Anxiety   . Barrett's esophagus   . Hypothyroidism   . GERD (gastroesophageal reflux disease)   . Hypertension   . Memory difficulties   . Osteoporosis   . Psoriasis (a type of skin inflammation)     CMP     Component Value Date/Time   NA 143 03/18/2017   NA 143 03/18/2017   K 3.8 03/18/2017   K 3.8 03/18/2017   CL 100 (L)  01/26/2016 1935   CO2 30 01/26/2016 1935   GLUCOSE 115 (H) 01/26/2016 1935   BUN 12 03/18/2017   CREATININE 0.64 03/18/2017   CREATININE 0.6 03/18/2017   CALCIUM 9.2 03/18/2017   ALBUMIN 3.8 03/18/2017   AST 9 03/18/2017   AST 9 (A) 03/18/2017   ALT 7 03/18/2017   ALT 7 03/18/2017   ALKPHOS 99 03/18/2017   ALKPHOS 99 03/18/2017   BILITOT 0.2 03/18/2017   GFRNONAA 85.41 03/18/2017   GFRAA >60 01/26/2016 1935   Recent Labs    03/18/17  NA 143  143  K 3.8  3.8  BUN 12  CREATININE 0.6  0.64  CALCIUM 9.2   Recent Labs    03/18/17  AST 9*  9  ALT 7  7  ALKPHOS 99  99  BILITOT 0.2  ALBUMIN 3.8   Recent Labs    03/18/17  WBC 5.6  5.6  HGB 11.4*  11.4  HCT 35*  34.9  PLT 203   Recent Labs    03/18/17  CHOL 178  178  LDLCALC 110  TRIG 156  156   No results found for: MICROALBUR Lab Results  Component Value Date   TSH 0.25 (A) 03/18/2017   Lab Results  Component Value Date   HGBA1C 5.3 03/18/2017   Lab Results  Component Value Date   CHOL 178 03/18/2017   CHOL 178 03/18/2017   HDL 37 03/18/2017   LDLCALC 110 03/18/2017   TRIG 156 03/18/2017   TRIG 156 03/18/2017    Significant Diagnostic Results in last 30 days:  No results found.  Assessment and Plan  Overactive bladder No complaints of recent infections or pain; continue Ditropan 5 mg 3 times daily  Hypertension Controlled on no meds; monthly monitoring  GERD (gastroesophageal reflux disease) No complaints of pain or reflux; continue Protonix 40 mg twice daily and Zantac 75 mg daily     Daquan Crapps D. [name], MD

## 2017-09-28 ENCOUNTER — Encounter: Payer: Self-pay | Admitting: Internal Medicine

## 2017-09-28 NOTE — Assessment & Plan Note (Signed)
Controlled on no meds; monthly monitoring

## 2017-09-28 NOTE — Assessment & Plan Note (Signed)
No complaints of recent infections or pain; continue Ditropan 5 mg 3 times daily

## 2017-09-28 NOTE — Assessment & Plan Note (Signed)
No complaints of pain or reflux; continue Protonix 40 mg twice daily and Zantac 75 mg daily

## 2017-10-16 ENCOUNTER — Non-Acute Institutional Stay (SKILLED_NURSING_FACILITY): Payer: Medicare Other | Admitting: Internal Medicine

## 2017-10-16 ENCOUNTER — Encounter: Payer: Self-pay | Admitting: Internal Medicine

## 2017-10-16 DIAGNOSIS — F39 Unspecified mood [affective] disorder: Secondary | ICD-10-CM

## 2017-10-16 DIAGNOSIS — E034 Atrophy of thyroid (acquired): Secondary | ICD-10-CM | POA: Diagnosis not present

## 2017-10-16 DIAGNOSIS — F02818 Dementia in other diseases classified elsewhere, unspecified severity, with other behavioral disturbance: Secondary | ICD-10-CM

## 2017-10-16 DIAGNOSIS — G301 Alzheimer's disease with late onset: Secondary | ICD-10-CM

## 2017-10-16 DIAGNOSIS — F0281 Dementia in other diseases classified elsewhere with behavioral disturbance: Secondary | ICD-10-CM | POA: Diagnosis not present

## 2017-10-16 NOTE — Progress Notes (Signed)
Location:  [facility] Room Number: [id number] Place of Service:  SNF [phone])  Rachel Dickerson. [name], MD  Patient Care Team: [name], MD as PCP - General (Internal Medicine)  Extended Emergency Contact Information Primary Emergency Contact: [name] [phone] [name] Home Phone: [phone] Relation: Niece Secondary Emergency Contact: Dorice Lamas [place] Home Phone: [phone] Relation: Niece    Allergies: Azithromycin; Cephalexin; Cephalosporins; Ciprofloxacin; Darvon [propoxyphene]; Flagyl [metronidazole]; Librax [chlordiazepoxide-clidinium]; Macrodantin [nitrofurantoin macrocrystal]; Nitrofuran derivatives; Other; Oxycodone-acetaminophen; Percocet [oxycodone-acetaminophen]; Quetiapine; Sulfa antibiotics; and Sulfamethoxazole  Chief Complaint  Patient presents with  . Medical Management of Chronic Issues    Routine Visit    HPI: Patient is 77 y.o. female who is being seen for routine issues of mood disorder, hypothyroidism, and dementia.  Past Medical History:  Diagnosis Date  . Acute encephalopathy 02/13/2016  . Anemia   . Anxiety   . Arthritis   . Asthma   . Barrett's esophagus   . Chronic kidney disease   . COPD (chronic obstructive pulmonary disease) (HCC)   . Dementia   . Dementia with behavioral disturbance 02/13/2016  . Depression   . Disease of thyroid gland   . Diverticulitis   . GERD (gastroesophageal reflux disease)   . Hyperlipemia   . Hypertension   . Memory difficulties   . Osteoporosis   . Pancreatitis   . Paroxysmal atrial fibrillation with RVR (HCC) 05/10/2016  . Psoriasis (a type of skin inflammation)   . Vertigo   . Vitamin D deficiency 08/31/2016    Past Surgical History:  Procedure Laterality Date  . BACK SURGERY    . brain shun    . CHOLECYSTECTOMY    . ROTATOR CUFF REPAIR    . SHOULDER SURGERY    . UPPER GI ENDOSCOPY  12/19/2015   [facility],  Include esophagus, stomach & duodenum or / Jejunum: Dx w/wo biopsys and dilatation; Surgeon : Leonia Reeves Rhoton MD    Allergies as of 10/16/2017      Reactions   Azithromycin Other (See Comments)   unknown   Cephalexin Other (See Comments)   unknown   Cephalosporins    Ciprofloxacin Other (See Comments)   unknown   Darvon [propoxyphene]    Flagyl [metronidazole] Other (See Comments)   unknown   Librax [chlordiazepoxide-clidinium] Other (See Comments)   unknown   Macrodantin [nitrofurantoin Macrocrystal]    Nitrofuran Derivatives Other (See Comments)   unknown   Other Other (See Comments)   unknown   Oxycodone-acetaminophen Other (See Comments)   unknown   Percocet [oxycodone-acetaminophen]    Quetiapine Other (See Comments)   unknown Potassium level dropped.   Sulfa Antibiotics    Sulfamethoxazole Other (See Comments)   unknown      Medication List        Accurate as of 10/16/17 11:59 PM. Always use your most recent med list.          acetaminophen 500 MG tablet Commonly known as:  TYLENOL Take 500 mg by mouth 2 (two) times daily as needed. pain   acetaminophen 325 MG tablet Commonly known as:  TYLENOL Take 650 mg by mouth every 8 (eight) hours as needed.   albuterol 108 (90 Base) MCG/ACT inhaler Commonly known as:  PROVENTIL HFA;VENTOLIN HFA Inhale 2 puffs into the lungs every 6 (six) hours as needed for wheezing.   cetirizine 10 MG tablet Commonly  known as:  ZYRTEC Take 10 mg by mouth at bedtime.   clonazePAM 1 MG tablet Commonly known as:  KLONOPIN Give 1 mg by mouth at bedtime   D3-1000 1000 units tablet Generic drug:  Cholecalciferol Take 1,000 Units by mouth daily.   donepezil 10 MG tablet Commonly known as:  ARICEPT Take 10 mg by mouth at bedtime.   guaiFENesin 100 MG/5ML liquid Commonly known as:  ROBITUSSIN Take 200 mg by mouth. TAKE 400MG  = 20 ML PO BID FOR COUGH   ipratropium-albuterol 0.5-2.5 (3) MG/3ML Soln Commonly known as:   DUONEB Take 3 mLs by nebulization every 4 (four) hours as needed.   levothyroxine 100 MCG tablet Commonly known as:  SYNTHROID, LEVOTHROID Take 100 mcg by mouth daily before breakfast.   oxybutynin 5 MG tablet Commonly known as:  DITROPAN Take 5 mg by mouth 3 (three) times daily.   pantoprazole 40 MG tablet Commonly known as:  PROTONIX Take 40 mg by mouth 2 (two) times daily.   ranitidine 75 MG tablet Commonly known as:  ZANTAC Take 75 mg by mouth at bedtime.   sodium chloride 0.65 % Soln nasal spray Commonly known as:  OCEAN Place 2 sprays into both nostrils 2 (two) times daily as needed. dry nasal membrane   theophylline 100 MG 12 hr tablet Commonly known as:  THEODUR Take 100 mg by mouth 2 (two) times daily. COPD   traMADol 50 MG tablet Commonly known as:  ULTRAM Take 50 mg by mouth 2 (two) times daily.   venlafaxine 37.5 MG tablet Commonly known as:  EFFEXOR Take 37.5 mg by mouth daily.       No orders of the defined types were placed in this encounter.   Immunization History  Administered Date(s) Administered  . Influenza, High Dose Seasonal PF 12/20/2014  . Influenza, Seasonal, Injecte, Preservative Fre 10/10/2014  . Influenza-Unspecified 12/07/2015, 11/21/2016  . PPD Test 02/10/2016, 02/26/2016  . Pneumococcal Conjugate-13 12/20/2014  . Pneumococcal Polysaccharide-23 02/07/2017  . Td 08/26/2014    Social History   Tobacco Use  . Smoking status: Former Smoker    Last attempt to quit: 12/17/1990    Years since quitting: 26.8  . Smokeless tobacco: Never Used  Substance Use Topics  . Alcohol use: No    Review of Systems  DATA OBTAINED: from patient, nurse GENERAL:  no fevers, fatigue, appetite changes SKIN: No itching, rash HEENT: No complaint RESPIRATORY: No cough, wheezing, SOB CARDIAC: No chest pain, palpitations, lower extremity edema  GI: No abdominal pain, No N/V/D or constipation, No heartburn or reflux  GU: No dysuria, frequency or  urgency, or incontinence  MUSCULOSKELETAL: No unrelieved bone/joint pain NEUROLOGIC: No headache, dizziness  PSYCHIATRIC: No overt anxiety or sadness  Vitals:   10/16/17 1202  BP: 122/70  Pulse: 90  Resp: 20  Temp: 98.1 F (36.7 C)   Body mass index is 26.57 kg/m. Physical Exam  GENERAL APPEARANCE: Alert, conversant, No acute distress  SKIN: No diaphoresis rash HEENT: Unremarkable RESPIRATORY: Breathing is even, unlabored. Lung sounds are clear   CARDIOVASCULAR: Heart RRR no murmurs, rubs or gallops. No peripheral edema  GASTROINTESTINAL: Abdomen is soft, non-tender, not distended w/ normal bowel sounds.  GENITOURINARY: Bladder non tender, not distended  MUSCULOSKELETAL: No abnormal joints or musculature NEUROLOGIC: Cranial nerves 2-12 grossly intact. Moves all extremities PSYCHIATRIC: Mood and affect appropriate to situation, no behavioral issues  Patient Active Problem List   Diagnosis Date Noted  . Hypotension 08/31/2016  . Postural dizziness  with presyncope 08/31/2016  . Chest pain 08/31/2016  . Paroxysmal atrial fibrillation (HCC) 08/31/2016  . Hypokalemia 08/31/2016  . Depression 08/31/2016  . Vitamin D deficiency 08/31/2016  . Mood disorder (HCC) 08/22/2016  . PNA (pneumonia) 05/10/2016  . COPD exacerbation (HCC) 05/10/2016  . UTI (urinary tract infection) 05/10/2016  . Paroxysmal atrial fibrillation with RVR (HCC) 05/10/2016  . Metabolic encephalopathy 05/10/2016  . Elevated amylase and lipase 02/13/2016  . Acute encephalopathy 02/13/2016  . Psychosis (HCC) 02/13/2016  . Dementia with behavioral disturbance 02/13/2016  . Overactive bladder 02/13/2016  . Chronic kidney disease   . COPD (chronic obstructive pulmonary disease) (HCC)   . Anemia   . Anxiety   . Barrett's esophagus   . Hypothyroidism   . GERD (gastroesophageal reflux disease)   . Hypertension   . Memory difficulties   . Osteoporosis   . Psoriasis (a type of skin inflammation)     CMP      Component Value Date/Time   NA 143 03/18/2017   NA 143 03/18/2017   K 3.8 03/18/2017   K 3.8 03/18/2017   CL 100 (L) 01/26/2016 1935   CO2 30 01/26/2016 1935   GLUCOSE 115 (H) 01/26/2016 1935   BUN 12 03/18/2017   CREATININE 0.64 03/18/2017   CREATININE 0.6 03/18/2017   CALCIUM 9.2 03/18/2017   ALBUMIN 3.8 03/18/2017   AST 9 03/18/2017   AST 9 (A) 03/18/2017   ALT 7 03/18/2017   ALT 7 03/18/2017   ALKPHOS 99 03/18/2017   ALKPHOS 99 03/18/2017   BILITOT 0.2 03/18/2017   GFRNONAA 85.41 03/18/2017   GFRAA >60 01/26/2016 1935   Recent Labs    03/18/17  NA 143  143  K 3.8  3.8  BUN 12  CREATININE 0.6  0.64  CALCIUM 9.2   Recent Labs    03/18/17  AST 9*  9  ALT 7  7  ALKPHOS 99  99  BILITOT 0.2  ALBUMIN 3.8   Recent Labs    03/18/17  WBC 5.6  5.6  HGB 11.4*  11.4  HCT 35*  34.9  PLT 203   Recent Labs    03/18/17  CHOL 178  178  LDLCALC 110  TRIG 156  156   No results found for: MICROALBUR Lab Results  Component Value Date   TSH 0.25 (A) 03/18/2017   Lab Results  Component Value Date   HGBA1C 5.3 03/18/2017   Lab Results  Component Value Date   CHOL 178 03/18/2017   CHOL 178 03/18/2017   HDL 37 03/18/2017   LDLCALC 110 03/18/2017   TRIG 156 03/18/2017   TRIG 156 03/18/2017    Significant Diagnostic Results in last 30 days:  No results found.  Assessment and Plan  Mood disorder (HCC) Appears very content; continue Effexor 37.5 mg daily  Hypothyroidism Stable; continue Synthroid 100 mcg daily  Dementia with behavioral disturbance Mild and stable; continue Aricept 10 mg daily    Decorian Schuenemann D. [name], MD

## 2017-10-18 ENCOUNTER — Encounter: Payer: Self-pay | Admitting: Internal Medicine

## 2017-10-18 NOTE — Assessment & Plan Note (Signed)
Appears very content; continue Effexor 37.5 mg daily

## 2017-10-18 NOTE — Assessment & Plan Note (Signed)
Mild and stable; continue Aricept 10 mg daily

## 2017-10-18 NOTE — Assessment & Plan Note (Signed)
Stable; continue Synthroid 100 mcg daily

## 2017-10-24 ENCOUNTER — Non-Acute Institutional Stay (SKILLED_NURSING_FACILITY): Payer: Medicare Other | Admitting: Internal Medicine

## 2017-10-24 DIAGNOSIS — J40 Bronchitis, not specified as acute or chronic: Secondary | ICD-10-CM

## 2017-11-04 ENCOUNTER — Encounter: Payer: Self-pay | Admitting: Internal Medicine

## 2017-11-04 NOTE — Progress Notes (Signed)
:    Location:  [facility] Room Number: 213W Place of Service:  SNF (31)  Rachel Dickerson. Rachel Hollingshead, MD  Patient Care Team: [name], MD as PCP - General (Internal Medicine)  Extended Emergency Contact Information Primary Emergency Contact: [name] [phone] [name] Home Phone: [phone] Relation: Niece Secondary Emergency Contact: Dorice Lamas [place] of [place] Home Phone: [phone] Relation: Niece     Allergies: Azithromycin; Cephalexin; Cephalosporins; Ciprofloxacin; Darvon [propoxyphene]; Flagyl [metronidazole]; Librax [chlordiazepoxide-clidinium]; Macrodantin [nitrofurantoin macrocrystal]; Nitrofuran derivatives; Other; Oxycodone-acetaminophen; Percocet [oxycodone-acetaminophen]; Quetiapine; Sulfa antibiotics; and Sulfamethoxazole  Chief Complaint  Patient presents with  . Acute Visit    Cough    HPI: Patient is 77 y.o. female who is seen acutely for cough for 3 to 4 days.  Patient has had a low-grade fever and her sputum is now turning green.  Patient admits that she has some central chest pain with cough.  Cough syrup makes it better briefly, talking in running around makes her cough worse.  No appetite changes.  Past Medical History:  Diagnosis Date  . Acute encephalopathy 02/13/2016  . Anemia   . Anxiety   . Arthritis   . Asthma   . Barrett's esophagus   . Chronic kidney disease   . COPD (chronic obstructive pulmonary disease) (HCC)   . Dementia   . Dementia with behavioral disturbance 02/13/2016  . Depression   . Disease of thyroid gland   . Diverticulitis   . GERD (gastroesophageal reflux disease)   . Hyperlipemia   . Hypertension   . Memory difficulties   . Osteoporosis   . Pancreatitis   . Paroxysmal atrial fibrillation with RVR (HCC) 05/10/2016  . Psoriasis (a type of skin inflammation)   . Vertigo   . Vitamin D deficiency 08/31/2016    Past Surgical History:    Procedure Laterality Date  . BACK SURGERY    . brain shun    . CHOLECYSTECTOMY    . ROTATOR CUFF REPAIR    . SHOULDER SURGERY    . UPPER GI ENDOSCOPY  12/19/2015   [facility], Include esophagus, stomach & duodenum or / Jejunum: Dx w/wo biopsys and dilatation; Surgeon : Leonia Reeves Rhoton MD    Allergies as of 10/24/2017      Reactions   Azithromycin Other (See Comments)   unknown   Cephalexin Other (See Comments)   unknown   Cephalosporins    Ciprofloxacin Other (See Comments)   unknown   Darvon [propoxyphene]    Flagyl [metronidazole] Other (See Comments)   unknown   Librax [chlordiazepoxide-clidinium] Other (See Comments)   unknown   Macrodantin [nitrofurantoin Macrocrystal]    Nitrofuran Derivatives Other (See Comments)   unknown   Other Other (See Comments)   unknown   Oxycodone-acetaminophen Other (See Comments)   unknown   Percocet [oxycodone-acetaminophen]    Quetiapine Other (See Comments)   unknown Potassium level dropped.   Sulfa Antibiotics    Sulfamethoxazole Other (See Comments)   unknown      Medication List        Accurate as of 10/24/17 11:59 PM. Always use your most recent med list.          acetaminophen 500 MG tablet Commonly known as:  TYLENOL Take 500 mg by mouth 2 (two) times daily as needed. pain   acetaminophen 325 MG tablet Commonly known as:  TYLENOL Take 650 mg  by mouth every 8 (eight) hours as needed.   albuterol 108 (90 Base) MCG/ACT inhaler Commonly known as:  PROVENTIL HFA;VENTOLIN HFA Inhale 2 puffs into the lungs every 6 (six) hours as needed for wheezing.   cetirizine 10 MG tablet Commonly known as:  ZYRTEC Take 10 mg by mouth at bedtime.   clonazePAM 1 MG tablet Commonly known as:  KLONOPIN Give 1 mg by mouth at bedtime   D3-1000 1000 units tablet Generic drug:  Cholecalciferol Take 1,000 Units by mouth daily.   donepezil 10 MG tablet Commonly known as:  ARICEPT Take 10 mg by mouth at bedtime.   guaiFENesin  100 MG/5ML liquid Commonly known as:  ROBITUSSIN Take 200 mg by mouth. TAKE 400MG  = 20 ML PO BID FOR COUGH   HYDROcodone-acetaminophen 5-325 MG tablet Commonly known as:  NORCO/VICODIN Take 1 tablet by mouth every 8 (eight) hours as needed for moderate pain.   ipratropium-albuterol 0.5-2.5 (3) MG/3ML Soln Commonly known as:  DUONEB Take 3 mLs by nebulization every 4 (four) hours as needed.   levothyroxine 100 MCG tablet Commonly known as:  SYNTHROID, LEVOTHROID Take 100 mcg by mouth daily before breakfast.   oxybutynin 5 MG tablet Commonly known as:  DITROPAN Take 5 mg by mouth 3 (three) times daily.   pantoprazole 40 MG tablet Commonly known as:  PROTONIX Take 40 mg by mouth 2 (two) times daily.   ranitidine 75 MG tablet Commonly known as:  ZANTAC Take 75 mg by mouth at bedtime.   sodium chloride 0.65 % Soln nasal spray Commonly known as:  OCEAN Place 2 sprays into both nostrils 2 (two) times daily as needed. dry nasal membrane   theophylline 100 MG 12 hr tablet Commonly known as:  THEODUR Take 100 mg by mouth 2 (two) times daily. COPD   venlafaxine 37.5 MG tablet Commonly known as:  EFFEXOR Take 37.5 mg by mouth daily.       No orders of the defined types were placed in this encounter.   Immunization History  Administered Date(s) Administered  . Influenza, High Dose Seasonal PF 12/20/2014  . Influenza, Seasonal, Injecte, Preservative Fre 10/10/2014  . Influenza-Unspecified 12/07/2015, 11/21/2016  . PPD Test 02/10/2016, 02/26/2016  . Pneumococcal Conjugate-13 12/20/2014  . Pneumococcal Polysaccharide-23 02/07/2017  . Td 08/26/2014    Social History   Tobacco Use  . Smoking status: Former Smoker    Last attempt to quit: 12/17/1990    Years since quitting: 26.9  . Smokeless tobacco: Never Used  Substance Use Topics  . Alcohol use: No    Family history is   Family History  Problem Relation Age of Onset  . Arthritis Mother   . Heart disease Mother    . Hypertension Mother   . Cancer Father   . Arthritis Father   . Heart disease Father   . Alzheimer's disease Sister   . Arthritis Sister   . Heart disease Sister   . Memory loss Sister   . Cancer Sister   . Heart attack Sister   . Cancer Brother   . Lung disease Brother   . Heart disease Brother       Review of Systems  DATA OBTAINED: from patient, nurse GENERAL: Low-grade fevers, some fatigue, appetite changes SKIN: No itching, or rash EYES: No eye pain, redness, discharge EARS: No earache, tinnitus, change in hearing NOSE: No congestion, drainage or bleeding  MOUTH/THROAT: No mouth or tooth pain, No sore throat RESPIRATORY: Yes, cough with green sputum,  no wheezing, no SOB CARDIAC: Central chest pain, palpitations, lower extremity edema  GI: No abdominal pain, No N/V/D or constipation, No heartburn or reflux  GU: No dysuria, frequency or urgency, or incontinence  MUSCULOSKELETAL: No unrelieved bone/joint pain NEUROLOGIC: No headache, dizziness or focal weakness PSYCHIATRIC: No c/o anxiety or sadness   Vitals:   11/04/17 1208  BP: 114/74  Pulse: 88  Resp: 18  Temp: (!) 97.2 F (36.2 C)    SpO2 Readings from Last 1 Encounters:  07/31/17 94%   Body mass index is 26.57 kg/m.     Physical Exam  GENERAL APPEARANCE: Alert, conversant,  No acute distress.  SKIN: No diaphoresis rash HEAD: Normocephalic, atraumatic  EYES: Conjunctiva/lids clear. Pupils round, reactive. EOMs intact.  EARS: External exam WNL, canals clear. Hearing grossly normal.  NOSE: No deformity or discharge.  MOUTH/THROAT: Lips w/o lesions  RESPIRATORY: Breathing is even, unlabored. Lung sounds are clear but no wheezing no rales: Some cough CARDIOVASCULAR: Heart RRR no murmurs, rubs or gallops. No peripheral edema.   GASTROINTESTINAL: Abdomen is soft, non-tender, not distended w/ normal bowel sounds. GENITOURINARY: Bladder non tender, not distended  MUSCULOSKELETAL: No abnormal joints or  musculature NEUROLOGIC:  Cranial nerves 2-12 grossly intact. Moves all extremities  PSYCHIATRIC: Mood and affect appropriate to situation, no behavioral issues  Patient Active Problem List   Diagnosis Date Noted  . Hypotension 08/31/2016  . Postural dizziness with presyncope 08/31/2016  . Chest pain 08/31/2016  . Paroxysmal atrial fibrillation (HCC) 08/31/2016  . Hypokalemia 08/31/2016  . Depression 08/31/2016  . Vitamin D deficiency 08/31/2016  . Mood disorder (HCC) 08/22/2016  . PNA (pneumonia) 05/10/2016  . COPD exacerbation (HCC) 05/10/2016  . UTI (urinary tract infection) 05/10/2016  . Paroxysmal atrial fibrillation with RVR (HCC) 05/10/2016  . Metabolic encephalopathy 05/10/2016  . Elevated amylase and lipase 02/13/2016  . Acute encephalopathy 02/13/2016  . Psychosis (HCC) 02/13/2016  . Dementia with behavioral disturbance 02/13/2016  . Overactive bladder 02/13/2016  . Chronic kidney disease   . COPD (chronic obstructive pulmonary disease) (HCC)   . Anemia   . Anxiety   . Barrett's esophagus   . Hypothyroidism   . GERD (gastroesophageal reflux disease)   . Hypertension   . Memory difficulties   . Osteoporosis   . Psoriasis (a type of skin inflammation)       Labs reviewed: Basic Metabolic Panel:    Component Value Date/Time   NA 143 03/18/2017   NA 143 03/18/2017   K 3.8 03/18/2017   K 3.8 03/18/2017   CL 100 (L) 01/26/2016 1935   CO2 30 01/26/2016 1935   GLUCOSE 115 (H) 01/26/2016 1935   BUN 12 03/18/2017   CREATININE 0.64 03/18/2017   CREATININE 0.6 03/18/2017   CALCIUM 9.2 03/18/2017   ALBUMIN 3.8 03/18/2017   AST 9 03/18/2017   AST 9 (A) 03/18/2017   ALT 7 03/18/2017   ALT 7 03/18/2017   ALKPHOS 99 03/18/2017   ALKPHOS 99 03/18/2017   BILITOT 0.2 03/18/2017   GFRNONAA 85.41 03/18/2017   GFRAA >60 01/26/2016 1935    Recent Labs    03/18/17  NA 143  143  K 3.8  3.8  BUN 12  CREATININE 0.6  0.64  CALCIUM 9.2   Liver Function  Tests: Recent Labs    03/18/17  AST 9*  9  ALT 7  7  ALKPHOS 99  99  BILITOT 0.2  ALBUMIN 3.8   No results for input(s): LIPASE, AMYLASE in  the last 8760 hours. No results for input(s): AMMONIA in the last 8760 hours. CBC: Recent Labs    03/18/17  WBC 5.6  5.6  HGB 11.4*  11.4  HCT 35*  34.9  PLT 203   Lipid Recent Labs    03/18/17  CHOL 178  178  HDL 37  LDLCALC 110  TRIG [id number]  156    Cardiac Enzymes: No results for input(s): CKTOTAL, CKMB, CKMBINDEX, TROPONINI in the last 8760 hours. BNP: No results for input(s): BNP in the last 8760 hours. No results found for: [facility] Lab Results  Component Value Date   HGBA1C 5.3 03/18/2017   Lab Results  Component Value Date   TSH 0.25 (A) 03/18/2017   Lab Results  Component Value Date   VITAMINB12 1,188 05/24/2016   No results found for: FOLATE No results found for: IRON, TIBC, FERRITIN  Imaging and Procedures obtained prior to SNF admission: Ct Abdomen Pelvis W Contrast  Result Date: 01/26/2016 CLINICAL DATA:  Lower abdominal pain and low back pain EXAM: CT ABDOMEN AND PELVIS WITH CONTRAST TECHNIQUE: Multidetector CT imaging of the abdomen and pelvis was performed using the standard protocol following bolus administration of intravenous contrast. CONTRAST:  100mL ISOVUE-300 IOPAMIDOL (ISOVUE-300) INJECTION 61% COMPARISON:  Chest radiograph 11/01/2007 FINDINGS: Lower chest: No pulmonary nodules. No visible pleural or pericardial effusion. Hepatobiliary: Normal hepatic size and contours without focal liver lesion. No perihepatic ascites. No intra- or extrahepatic biliary dilatation. Status post cholecystectomy. Pancreas: There is inflammatory stranding surrounding the head of the pancreas. No peripancreatic fluid collection. The pancreatic duct is not dilated. Spleen: Normal. Adrenals/Urinary Tract: Normal adrenal glands. No hydronephrosis or solid renal mass. Stomach/Bowel: There is descending colonic and sigmoid  diverticulosis without evidence of acute inflammation. No dilated small bowel. No abdominal fluid collection. Normal appendix. Vascular/Lymphatic: There is atherosclerotic calcification of the non aneurysmal abdominal aorta. No abdominal or pelvic adenopathy. Reproductive: Normal uterus and ovaries for age. Musculoskeletal: There is compression deformity of the T8 vertebral body, favored to be chronic. This appears unchanged relative to the radiograph of 11/01/2007 Normal visualized extrathoracic and extraperitoneal soft tissues. Other: No contributory non-categorized findings. IMPRESSION: 1. Acute pancreatitis without evidence of pancreatic necrosis or peripancreatic fluid collection. 2. Aortic atherosclerosis. 3. Descending colonic and sigmoid diverticulosis without acute diverticulitis. Electronically Signed   By: Deatra RobinsonKevin  Herman M.D.   On: 01/26/2016 21:57     Not all labs, radiology exams or other studies done during hospitalization come through on my EPIC note; however they are reviewed by me.    Assessment and Plan  Bronchitis- chest x-ray shows bronchitis, might be bronchopneumonia, bronchitis is been going around the building, patient is allergic to every drug except for doxycycline and Levaquin therefore use doxycycline 100 mg twice daily for 7 days and continue patient's Mucinex DM: We will monitor progress   Rachel Goldsmithnne D. Rachel HollingsheadAlexander, MD

## 2017-11-13 ENCOUNTER — Non-Acute Institutional Stay (SKILLED_NURSING_FACILITY): Payer: Medicare Other | Admitting: Internal Medicine

## 2017-11-13 ENCOUNTER — Encounter: Payer: Self-pay | Admitting: Internal Medicine

## 2017-11-13 DIAGNOSIS — E559 Vitamin D deficiency, unspecified: Secondary | ICD-10-CM | POA: Diagnosis not present

## 2017-11-13 DIAGNOSIS — J449 Chronic obstructive pulmonary disease, unspecified: Secondary | ICD-10-CM

## 2017-11-13 DIAGNOSIS — F32A Depression, unspecified: Secondary | ICD-10-CM

## 2017-11-13 DIAGNOSIS — F329 Major depressive disorder, single episode, unspecified: Secondary | ICD-10-CM

## 2017-11-13 NOTE — Progress Notes (Signed)
Location:  [facility] Room Number: 213W Place of Service:  SNF [phone])  Randon Goldsmith. [name], MD  Patient Care Team: [name], MD as PCP - General (Internal Medicine)  Extended Emergency Contact Information Primary Emergency Contact: [name] [phone] [name] of [place] Home Phone: [phone] Relation: Niece Secondary Emergency Contact: Dorice Lamas [place] of [place] Home Phone: [phone] Relation: Niece    Allergies: Azithromycin; Cephalexin; Cephalosporins; Ciprofloxacin; Darvon [propoxyphene]; Flagyl [metronidazole]; Librax [chlordiazepoxide-clidinium]; Macrodantin [nitrofurantoin macrocrystal]; Nitrofuran derivatives; Other; Oxycodone-acetaminophen; Percocet [oxycodone-acetaminophen]; Quetiapine; Sulfa antibiotics; and Sulfamethoxazole  Chief Complaint  Patient presents with  . Medical Management of Chronic Issues    Routine Visit  . Health Maintenance    Influenza vacc. & routine labs    HPI: Patient is 77 y.o. female who is being seen for routine issues of COPD, vitamin D deficiency and depression.  Past Medical History:  Diagnosis Date  . Acute encephalopathy 02/13/2016  . Anemia   . Anxiety   . Arthritis   . Asthma   . Barrett's esophagus   . Chronic kidney disease   . COPD (chronic obstructive pulmonary disease) (HCC)   . Dementia (HCC)   . Dementia with behavioral disturbance (HCC) 02/13/2016  . Depression   . Disease of thyroid gland   . Diverticulitis   . GERD (gastroesophageal reflux disease)   . Hyperlipemia   . Hypertension   . Memory difficulties   . Osteoporosis   . Pancreatitis   . Paroxysmal atrial fibrillation with RVR (HCC) 05/10/2016  . Psoriasis (a type of skin inflammation)   . Vertigo   . Vitamin D deficiency 08/31/2016    Past Surgical History:  Procedure Laterality Date  . BACK SURGERY    . brain shun    . CHOLECYSTECTOMY    . ROTATOR CUFF REPAIR    .  SHOULDER SURGERY    . UPPER GI ENDOSCOPY  12/19/2015   [facility], Include esophagus, stomach & duodenum or / Jejunum: Dx w/wo biopsys and dilatation; Surgeon : Leonia Reeves Rhoton MD    Allergies as of 11/13/2017      Reactions   Azithromycin Other (See Comments)   unknown   Cephalexin Other (See Comments)   unknown   Cephalosporins    Ciprofloxacin Other (See Comments)   unknown   Darvon [propoxyphene]    Flagyl [metronidazole] Other (See Comments)   unknown   Librax [chlordiazepoxide-clidinium] Other (See Comments)   unknown   Macrodantin [nitrofurantoin Macrocrystal]    Nitrofuran Derivatives Other (See Comments)   unknown   Other Other (See Comments)   unknown   Oxycodone-acetaminophen Other (See Comments)   unknown   Percocet [oxycodone-acetaminophen]    Quetiapine Other (See Comments)   unknown Potassium level dropped.   Sulfa Antibiotics    Sulfamethoxazole Other (See Comments)   unknown      Medication List        Accurate as of 11/13/17 11:59 PM. Always use your most recent med list.          acetaminophen 500 MG tablet Commonly known as:  TYLENOL Take 500 mg by mouth 2 (two) times daily as needed. pain   acetaminophen 325 MG tablet Commonly known as:  TYLENOL Take 650 mg by mouth every 8 (eight) hours as needed.   albuterol 108 (90 Base) MCG/ACT inhaler Commonly known as:  PROVENTIL HFA;VENTOLIN HFA Inhale 2 puffs into the lungs  every 6 (six) hours as needed for wheezing.   cetirizine 10 MG tablet Commonly known as:  ZYRTEC Take 10 mg by mouth at bedtime.   clonazePAM 1 MG tablet Commonly known as:  KLONOPIN Give 1 mg by mouth at bedtime   D3-1000 1000 units tablet Generic drug:  Cholecalciferol Take 1,000 Units by mouth daily.   donepezil 10 MG tablet Commonly known as:  ARICEPT Take 10 mg by mouth at bedtime.   guaiFENesin 100 MG/5ML liquid Commonly known as:  ROBITUSSIN Take 200 mg by mouth. TAKE 400MG  = 20 ML PO BID FOR COUGH     HYDROcodone-acetaminophen 5-325 MG tablet Commonly known as:  NORCO/VICODIN Take 1 tablet by mouth every 8 (eight) hours as needed for moderate pain.   ipratropium-albuterol 0.5-2.5 (3) MG/3ML Soln Commonly known as:  DUONEB Take 3 mLs by nebulization every 4 (four) hours as needed.   levothyroxine 100 MCG tablet Commonly known as:  SYNTHROID, LEVOTHROID Take 100 mcg by mouth daily before breakfast.   oxybutynin 5 MG tablet Commonly known as:  DITROPAN Take 5 mg by mouth 3 (three) times daily.   pantoprazole 40 MG tablet Commonly known as:  PROTONIX Take 40 mg by mouth 2 (two) times daily.   ranitidine 75 MG tablet Commonly known as:  ZANTAC Take 75 mg by mouth at bedtime.   sodium chloride 0.65 % Soln nasal spray Commonly known as:  OCEAN Place 2 sprays into both nostrils 2 (two) times daily as needed. dry nasal membrane   theophylline 100 MG 12 hr tablet Commonly known as:  THEODUR Take 100 mg by mouth 2 (two) times daily. COPD   venlafaxine 37.5 MG tablet Commonly known as:  EFFEXOR Take 37.5 mg by mouth daily.       No orders of the defined types were placed in this encounter.   Immunization History  Administered Date(s) Administered  . Influenza, High Dose Seasonal PF 12/20/2014  . Influenza, Seasonal, Injecte, Preservative Fre 10/10/2014  . Influenza-Unspecified 12/07/2015, 11/21/2016  . PPD Test 02/10/2016, 02/26/2016  . Pneumococcal Conjugate-13 12/20/2014  . Pneumococcal Polysaccharide-23 02/07/2017  . Td 08/26/2014    Social History   Tobacco Use  . Smoking status: Former Smoker    Last attempt to quit: 12/17/1990    Years since quitting: 27.0  . Smokeless tobacco: Never Used  Substance Use Topics  . Alcohol use: No    Review of Systems  DATA OBTAINED: from patient, nurse GENERAL:  no fevers, fatigue, appetite changes SKIN: No itching, rash HEENT: No complaint RESPIRATORY: No cough, wheezing, SOB CARDIAC: No chest pain, palpitations,  lower extremity edema  GI: No abdominal pain, No N/V/D or constipation, No heartburn or reflux  GU: No dysuria, frequency or urgency, or incontinence  MUSCULOSKELETAL: No unrelieved bone/joint pain NEUROLOGIC: No headache, dizziness  PSYCHIATRIC: No overt anxiety or sadness  Vitals:   11/13/17 1214  BP: 124/70  Pulse: 74  Resp: 18  Temp: 98 F (36.7 C)   Body mass index is 27.21 kg/m. Physical Exam  GENERAL APPEARANCE: Alert, conversant, No acute distress  SKIN: No diaphoresis rash HEENT: Unremarkable RESPIRATORY: Breathing is even, unlabored. Lung sounds are clear   CARDIOVASCULAR: Heart RRR no murmurs, rubs or gallops. No peripheral edema  GASTROINTESTINAL: Abdomen is soft, non-tender, not distended w/ normal bowel sounds.  GENITOURINARY: Bladder non tender, not distended  MUSCULOSKELETAL: No abnormal joints or musculature NEUROLOGIC: Cranial nerves 2-12 grossly intact. Moves all extremities PSYCHIATRIC: Mood and affect appropriate to situation,  no behavioral issues  Patient Active Problem List   Diagnosis Date Noted  . Hypotension 08/31/2016  . Postural dizziness with presyncope 08/31/2016  . Chest pain 08/31/2016  . Paroxysmal atrial fibrillation (HCC) 08/31/2016  . Hypokalemia 08/31/2016  . Depression 08/31/2016  . Vitamin D deficiency 08/31/2016  . Mood disorder (HCC) 08/22/2016  . PNA (pneumonia) 05/10/2016  . COPD exacerbation (HCC) 05/10/2016  . UTI (urinary tract infection) 05/10/2016  . Paroxysmal atrial fibrillation with RVR (HCC) 05/10/2016  . Metabolic encephalopathy 05/10/2016  . Elevated amylase and lipase 02/13/2016  . Acute encephalopathy 02/13/2016  . Psychosis (HCC) 02/13/2016  . Dementia with behavioral disturbance (HCC) 02/13/2016  . Overactive bladder 02/13/2016  . Chronic kidney disease   . COPD (chronic obstructive pulmonary disease) (HCC)   . Anemia   . Anxiety   . Barrett's esophagus   . Hypothyroidism   . GERD (gastroesophageal  reflux disease)   . Hypertension   . Memory difficulties   . Osteoporosis   . Psoriasis (a type of skin inflammation)     CMP     Component Value Date/Time   NA 143 03/18/2017   NA 143 03/18/2017   K 3.8 03/18/2017   K 3.8 03/18/2017   CL 100 (L) 01/26/2016 1935   CO2 30 01/26/2016 1935   GLUCOSE 115 (H) 01/26/2016 1935   BUN 12 03/18/2017   CREATININE 0.64 03/18/2017   CREATININE 0.6 03/18/2017   CALCIUM 9.2 03/18/2017   ALBUMIN 3.8 03/18/2017   AST 9 03/18/2017   AST 9 (A) 03/18/2017   ALT 7 03/18/2017   ALT 7 03/18/2017   ALKPHOS 99 03/18/2017   ALKPHOS 99 03/18/2017   BILITOT 0.2 03/18/2017   GFRNONAA 85.41 03/18/2017   GFRAA >60 01/26/2016 1935   Recent Labs    03/18/17  NA 143  143  K 3.8  3.8  BUN 12  CREATININE 0.6  0.64  CALCIUM 9.2   Recent Labs    03/18/17  AST 9*  9  ALT 7  7  ALKPHOS 99  99  BILITOT 0.2  ALBUMIN 3.8   Recent Labs    03/18/17  WBC 5.6  5.6  HGB 11.4*  11.4  HCT 35*  34.9  PLT 203   Recent Labs    03/18/17  CHOL 178  178  LDLCALC 110  TRIG 156  156   No results found for: MICROALBUR Lab Results  Component Value Date   TSH 0.25 (A) 03/18/2017   Lab Results  Component Value Date   HGBA1C 5.3 03/18/2017   Lab Results  Component Value Date   CHOL 178 03/18/2017   CHOL 178 03/18/2017   HDL 37 03/18/2017   LDLCALC 110 03/18/2017   TRIG 156 03/18/2017   TRIG 156 03/18/2017    Significant Diagnostic Results in last 30 days:  No results found.  Assessment and Plan  COPD (chronic obstructive pulmonary disease) (HCC) No exacerbations in a long while; continue theophylline 100 mg twice daily along with PRN albuterol as needed DuoNeb and scheduled guaifenesin 200 mg twice daily  Vitamin D deficiency Stable; continue replacement 1000 units daily  Depression Well-controlled; continue Effexor 37.5 mg daily     Chany Woolworth D. [name], MD

## 2017-12-10 ENCOUNTER — Encounter: Payer: Self-pay | Admitting: Internal Medicine

## 2017-12-10 NOTE — Assessment & Plan Note (Signed)
Well-controlled; continue Effexor 37.5 mg daily

## 2017-12-10 NOTE — Assessment & Plan Note (Signed)
Stable; continue replacement 1000 units daily 

## 2017-12-10 NOTE — Assessment & Plan Note (Signed)
No exacerbations in a long while; continue theophylline 100 mg twice daily along with PRN albuterol as needed DuoNeb and scheduled guaifenesin 200 mg twice daily

## 2017-12-16 ENCOUNTER — Encounter: Payer: Self-pay | Admitting: Internal Medicine

## 2017-12-16 ENCOUNTER — Non-Acute Institutional Stay (SKILLED_NURSING_FACILITY): Payer: Medicare Other | Admitting: Internal Medicine

## 2017-12-16 DIAGNOSIS — F419 Anxiety disorder, unspecified: Secondary | ICD-10-CM

## 2017-12-16 DIAGNOSIS — F0281 Dementia in other diseases classified elsewhere with behavioral disturbance: Secondary | ICD-10-CM

## 2017-12-16 DIAGNOSIS — G301 Alzheimer's disease with late onset: Secondary | ICD-10-CM

## 2017-12-16 DIAGNOSIS — N3281 Overactive bladder: Secondary | ICD-10-CM

## 2017-12-16 DIAGNOSIS — F02818 Dementia in other diseases classified elsewhere, unspecified severity, with other behavioral disturbance: Secondary | ICD-10-CM

## 2017-12-16 NOTE — Assessment & Plan Note (Signed)
No complaints or recent infection; continue Ditropan 5 mg 3 times daily

## 2017-12-16 NOTE — Progress Notes (Signed)
Location:  [facility] Room Number: [address]W Place of Service:  SNF [phone])  Randon Goldsmith. [name], MD  Patient Care Team: [name], MD as PCP - General (Internal Medicine)  Extended Emergency Contact Information Primary Emergency Contact: [name] [phone] [name] Home Phone: [phone] Relation: Niece Secondary Emergency Contact: Dorice Lamas [place] Home Phone: [phone] Relation: Niece    Allergies: Azithromycin; Cephalexin; Cephalosporins; Ciprofloxacin; Darvon [propoxyphene]; Flagyl [metronidazole]; Librax [chlordiazepoxide-clidinium]; Macrodantin [nitrofurantoin macrocrystal]; Nitrofuran derivatives; Other; Oxycodone-acetaminophen; Percocet [oxycodone-acetaminophen]; Quetiapine; Sulfa antibiotics; and Sulfamethoxazole  Chief Complaint  Patient presents with  . Medical Management of Chronic Issues    Routine Visit  . Health Maintenance    Influenza vacc.    HPI: Patient is 77 y.o. female who is being seen for routine issues of anxiety, overactive bladder, and dementia.  Past Medical History:  Diagnosis Date  . Acute encephalopathy 02/13/2016  . Anemia   . Anxiety   . Arthritis   . Asthma   . Barrett's esophagus   . Chronic kidney disease   . COPD (chronic obstructive pulmonary disease) (HCC)   . Dementia (HCC)   . Dementia with behavioral disturbance (HCC) 02/13/2016  . Depression   . Disease of thyroid gland   . Diverticulitis   . GERD (gastroesophageal reflux disease)   . Hyperlipemia   . Hypertension   . Memory difficulties   . Osteoporosis   . Pancreatitis   . Paroxysmal atrial fibrillation with RVR (HCC) 05/10/2016  . Psoriasis (a type of skin inflammation)   . Vertigo   . Vitamin D deficiency 08/31/2016    Past Surgical History:  Procedure Laterality Date  . BACK SURGERY    . brain shun    . CHOLECYSTECTOMY    . ROTATOR CUFF REPAIR    . SHOULDER SURGERY      . UPPER GI ENDOSCOPY  12/19/2015   [facility], Include esophagus, stomach & duodenum or / Jejunum: Dx w/wo biopsys and dilatation; Surgeon : Leonia Reeves Rhoton MD    Allergies as of 12/16/2017      Reactions   Azithromycin Other (See Comments)   unknown   Cephalexin Other (See Comments)   unknown   Cephalosporins    Ciprofloxacin Other (See Comments)   unknown   Darvon [propoxyphene]    Flagyl [metronidazole] Other (See Comments)   unknown   Librax [chlordiazepoxide-clidinium] Other (See Comments)   unknown   Macrodantin [nitrofurantoin Macrocrystal]    Nitrofuran Derivatives Other (See Comments)   unknown   Other Other (See Comments)   unknown   Oxycodone-acetaminophen Other (See Comments)   unknown   Percocet [oxycodone-acetaminophen]    Quetiapine Other (See Comments)   unknown Potassium level dropped.   Sulfa Antibiotics    Sulfamethoxazole Other (See Comments)   unknown      Medication List        Accurate as of 12/16/17  8:12 PM. Always use your most recent med list.          acetaminophen 325 MG tablet Commonly known as:  TYLENOL Take 650 mg by mouth every 8 (eight) hours as needed.   albuterol 108 (90 Base) MCG/ACT inhaler Commonly known as:  PROVENTIL HFA;VENTOLIN HFA Inhale 2 puffs into the lungs every 6 (six) hours as needed for wheezing.   cetirizine 10 MG tablet Commonly known as:  ZYRTEC Take 10 mg by mouth at  bedtime.   clonazePAM 1 MG tablet Commonly known as:  KLONOPIN Give 1 mg by mouth at bedtime   D3-1000 1000 units tablet Generic drug:  Cholecalciferol Take 1,000 Units by mouth daily.   donepezil 10 MG tablet Commonly known as:  ARICEPT Take 10 mg by mouth at bedtime.   fluticasone 50 MCG/ACT nasal spray Commonly known as:  FLONASE Place into both nostrils daily.   guaiFENesin 100 MG/5ML liquid Commonly known as:  ROBITUSSIN Take 200 mg by mouth. TAKE 400MG  = 20 ML PO BID FOR COUGH   HYDROcodone-acetaminophen 5-325 MG  tablet Commonly known as:  NORCO/VICODIN Take 1 tablet by mouth every 8 (eight) hours as needed for moderate pain.   ipratropium-albuterol 0.5-2.5 (3) MG/3ML Soln Commonly known as:  DUONEB Take 3 mLs by nebulization every 4 (four) hours as needed.   levothyroxine 100 MCG tablet Commonly known as:  SYNTHROID, LEVOTHROID Take 100 mcg by mouth daily before breakfast.   oxybutynin 5 MG tablet Commonly known as:  DITROPAN Take 5 mg by mouth 3 (three) times daily.   pantoprazole 40 MG tablet Commonly known as:  PROTONIX Take 40 mg by mouth 2 (two) times daily.   ranitidine 75 MG tablet Commonly known as:  ZANTAC Take 75 mg by mouth at bedtime.   sodium chloride 0.65 % Soln nasal spray Commonly known as:  OCEAN Place 2 sprays into both nostrils 2 (two) times daily as needed. dry nasal membrane   theophylline 100 MG 12 hr tablet Commonly known as:  THEODUR Take 100 mg by mouth 2 (two) times daily. COPD   venlafaxine 37.5 MG tablet Commonly known as:  EFFEXOR Take 37.5 mg by mouth daily.       No orders of the defined types were placed in this encounter.   Immunization History  Administered Date(s) Administered  . Influenza, High Dose Seasonal PF 12/20/2014  . Influenza, Seasonal, Injecte, Preservative Fre 10/10/2014  . Influenza-Unspecified 12/07/2015, 11/21/2016  . PPD Test 02/10/2016, 02/26/2016  . Pneumococcal Conjugate-13 12/20/2014  . Pneumococcal Polysaccharide-23 02/07/2017  . Td 08/26/2014    Social History   Tobacco Use  . Smoking status: Former Smoker    Last attempt to quit: 12/17/1990    Years since quitting: 27.0  . Smokeless tobacco: Never Used  Substance Use Topics  . Alcohol use: No    Review of Systems  DATA OBTAINED: from patient, nurse GENERAL:  no fevers, fatigue, appetite changes SKIN: No itching, rash HEENT: No complaint RESPIRATORY: No cough, wheezing, SOB CARDIAC: No chest pain, palpitations, lower extremity edema  GI: No  abdominal pain, No N/V/D or constipation, No heartburn or reflux  GU: No dysuria, frequency or urgency, or incontinence  MUSCULOSKELETAL: No unrelieved bone/joint pain NEUROLOGIC: No headache, dizziness  PSYCHIATRIC: No overt anxiety or sadness  Vitals:   12/16/17 1107  BP: 122/70  Pulse: 74  Resp: 18  Temp: 98 F (36.7 C)   Body mass index is 27.1 kg/m. Physical Exam  GENERAL APPEARANCE: Alert, conversant, No acute distress; walking in the halls daily with her walker dressed to the nines SKIN: No diaphoresis rash HEENT: Unremarkable RESPIRATORY: Breathing is even, unlabored. Lung sounds are clear   CARDIOVASCULAR: Heart RRR no murmurs, rubs or gallops. No peripheral edema  GASTROINTESTINAL: Abdomen is soft, non-tender, not distended w/ normal bowel sounds.  GENITOURINARY: Bladder non tender, not distended  MUSCULOSKELETAL: No abnormal joints or musculature NEUROLOGIC: Cranial nerves 2-12 grossly intact. Moves all extremities PSYCHIATRIC: Mood and affect appropriate  to situation, with minimal dementia no behavioral issues  Patient Active Problem List   Diagnosis Date Noted  . Hypotension 08/31/2016  . Postural dizziness with presyncope 08/31/2016  . Chest pain 08/31/2016  . Paroxysmal atrial fibrillation (HCC) 08/31/2016  . Hypokalemia 08/31/2016  . Depression 08/31/2016  . Vitamin D deficiency 08/31/2016  . Mood disorder (HCC) 08/22/2016  . PNA (pneumonia) 05/10/2016  . COPD exacerbation (HCC) 05/10/2016  . UTI (urinary tract infection) 05/10/2016  . Paroxysmal atrial fibrillation with RVR (HCC) 05/10/2016  . Metabolic encephalopathy 05/10/2016  . Elevated amylase and lipase 02/13/2016  . Acute encephalopathy 02/13/2016  . Psychosis (HCC) 02/13/2016  . Dementia with behavioral disturbance (HCC) 02/13/2016  . Overactive bladder 02/13/2016  . Chronic kidney disease   . COPD (chronic obstructive pulmonary disease) (HCC)   . Anemia   . Anxiety   . Barrett's esophagus    . Hypothyroidism   . GERD (gastroesophageal reflux disease)   . Hypertension   . Memory difficulties   . Osteoporosis   . Psoriasis (a type of skin inflammation)     CMP     Component Value Date/Time   NA 143 03/18/2017   NA 143 03/18/2017   K 3.8 03/18/2017   K 3.8 03/18/2017   CL 100 (L) 01/26/2016 1935   CO2 30 01/26/2016 1935   GLUCOSE 115 (H) 01/26/2016 1935   BUN 12 03/18/2017   CREATININE 0.64 03/18/2017   CREATININE 0.6 03/18/2017   CALCIUM 9.2 03/18/2017   ALBUMIN 3.8 03/18/2017   AST 9 03/18/2017   AST 9 (A) 03/18/2017   ALT 7 03/18/2017   ALT 7 03/18/2017   ALKPHOS 99 03/18/2017   ALKPHOS 99 03/18/2017   BILITOT 0.2 03/18/2017   GFRNONAA 85.41 03/18/2017   GFRAA >60 01/26/2016 1935   Recent Labs    03/18/17  NA 143  143  K 3.8  3.8  BUN 12  CREATININE 0.6  0.64  CALCIUM 9.2   Recent Labs    03/18/17  AST 9*  9  ALT 7  7  ALKPHOS 99  99  BILITOT 0.2  ALBUMIN 3.8   Recent Labs    03/18/17  WBC 5.6  5.6  HGB 11.4*  11.4  HCT 35*  34.9  PLT 203   Recent Labs    03/18/17  CHOL 178  178  LDLCALC 110  TRIG 156  156   No results found for: MICROALBUR Lab Results  Component Value Date   TSH 0.25 (A) 03/18/2017   Lab Results  Component Value Date   HGBA1C 5.3 03/18/2017   Lab Results  Component Value Date   CHOL 178 03/18/2017   CHOL 178 03/18/2017   HDL 37 03/18/2017   LDLCALC 110 03/18/2017   TRIG 156 03/18/2017   TRIG 156 03/18/2017    Significant Diagnostic Results in last 30 days:  No results found.  Assessment and Plan  Anxiety No apparent problem; continue clonazepam 1 mg nightly  Overactive bladder No complaints or recent infection; continue Ditropan 5 mg 3 times daily  Dementia with behavioral disturbance Very well controlled; continue Aricept 10 mg daily and Effexor 37.5 mg daily for mood    Adriahna Shearman D. [name], MD

## 2017-12-16 NOTE — Assessment & Plan Note (Addendum)
Very well controlled; continue Aricept 10 mg daily and Effexor 37.5 mg daily for mood

## 2017-12-16 NOTE — Assessment & Plan Note (Signed)
No apparent problem; continue clonazepam 1 mg nightly

## 2017-12-22 DIAGNOSIS — I10 Essential (primary) hypertension: Secondary | ICD-10-CM | POA: Diagnosis not present

## 2017-12-22 DIAGNOSIS — E119 Type 2 diabetes mellitus without complications: Secondary | ICD-10-CM | POA: Diagnosis not present

## 2017-12-22 DIAGNOSIS — D649 Anemia, unspecified: Secondary | ICD-10-CM | POA: Diagnosis not present

## 2017-12-22 DIAGNOSIS — E785 Hyperlipidemia, unspecified: Secondary | ICD-10-CM | POA: Diagnosis not present

## 2017-12-22 DIAGNOSIS — E039 Hypothyroidism, unspecified: Secondary | ICD-10-CM | POA: Diagnosis not present

## 2017-12-22 DIAGNOSIS — E559 Vitamin D deficiency, unspecified: Secondary | ICD-10-CM | POA: Diagnosis not present

## 2017-12-22 LAB — HEPATIC FUNCTION PANEL
ALT: 8 (ref 7–35)
AST: 13 (ref 13–35)
Alkaline Phosphatase: 88 (ref 25–125)
BILIRUBIN, TOTAL: 0.2

## 2017-12-22 LAB — LIPID PANEL
CHOLESTEROL: 185 (ref 0–200)
HDL: 34 — AB (ref 35–70)
LDL Cholesterol: 110
TRIGLYCERIDES: 203 — AB (ref 40–160)

## 2017-12-22 LAB — BASIC METABOLIC PANEL
BUN: 15 (ref 4–21)
Creatinine: 0.6 (ref 0.5–1.1)
Glucose: 94
POTASSIUM: 3.9 (ref 3.4–5.3)
Sodium: 142 (ref 137–147)

## 2017-12-22 LAB — HEMOGLOBIN A1C: HEMOGLOBIN A1C: 5.4

## 2017-12-22 LAB — VITAMIN D 25 HYDROXY (VIT D DEFICIENCY, FRACTURES): Vit D, 25-Hydroxy: 27.54

## 2017-12-22 LAB — CBC AND DIFFERENTIAL
HCT: 31 — AB (ref 36–46)
HEMOGLOBIN: 11 — AB (ref 12.0–16.0)
Platelets: 236 (ref 150–399)
WBC: 5.4

## 2017-12-22 LAB — TSH: TSH: 3.88 (ref 0.41–5.90)

## 2018-01-15 ENCOUNTER — Encounter: Payer: Self-pay | Admitting: Internal Medicine

## 2018-01-15 ENCOUNTER — Non-Acute Institutional Stay (SKILLED_NURSING_FACILITY): Payer: Medicare Other | Admitting: Internal Medicine

## 2018-01-15 DIAGNOSIS — J449 Chronic obstructive pulmonary disease, unspecified: Secondary | ICD-10-CM | POA: Diagnosis not present

## 2018-01-15 DIAGNOSIS — I480 Paroxysmal atrial fibrillation: Secondary | ICD-10-CM | POA: Diagnosis not present

## 2018-01-15 DIAGNOSIS — I10 Essential (primary) hypertension: Secondary | ICD-10-CM

## 2018-01-15 NOTE — Progress Notes (Signed)
Location:  [facility] Room Number: [id number] Place of Service:  SNF [phone])  Randon Goldsmith. [name], MD  Patient Care Team: [name], MD as PCP - General (Internal Medicine)  Extended Emergency Contact Information Primary Emergency Contact: [name] [phone] [name] of [place] Home Phone: [phone] Relation: Niece Secondary Emergency Contact: Dorice Lamas [place] Home Phone: [phone] Relation: Niece    Allergies: Azithromycin; Cephalexin; Cephalosporins; Ciprofloxacin; Darvon [propoxyphene]; Flagyl [metronidazole]; Librax [chlordiazepoxide-clidinium]; Macrodantin [nitrofurantoin macrocrystal]; Nitrofuran derivatives; Other; Oxycodone-acetaminophen; Percocet [oxycodone-acetaminophen]; Quetiapine; Sulfa antibiotics; and Sulfamethoxazole  Chief Complaint  Patient presents with  . Medical Management of Chronic Issues    Routine visit    HPI: Patient is 77 y.o. female who is being seen for routine issues paroxysmal atrial fibrillation, hypertension, and COPD.  Past Medical History:  Diagnosis Date  . Acute encephalopathy 02/13/2016  . Anemia   . Anxiety   . Arthritis   . Asthma   . Barrett's esophagus   . Chronic kidney disease   . COPD (chronic obstructive pulmonary disease) (HCC)   . Dementia (HCC)   . Dementia with behavioral disturbance (HCC) 02/13/2016  . Depression   . Disease of thyroid gland   . Diverticulitis   . GERD (gastroesophageal reflux disease)   . Hyperlipemia   . Hypertension   . Memory difficulties   . Osteoporosis   . Pancreatitis   . Paroxysmal atrial fibrillation with RVR (HCC) 05/10/2016  . Psoriasis (a type of skin inflammation)   . Vertigo   . Vitamin D deficiency 08/31/2016    Past Surgical History:  Procedure Laterality Date  . BACK SURGERY    . brain shun    . CHOLECYSTECTOMY    . ROTATOR CUFF REPAIR    . SHOULDER SURGERY    . UPPER GI ENDOSCOPY   12/19/2015   [facility], Include esophagus, stomach & duodenum or / Jejunum: Dx w/wo biopsys and dilatation; Surgeon : Leonia Reeves Rhoton MD    Allergies as of 01/15/2018      Reactions   Azithromycin Other (See Comments)   unknown   Cephalexin Other (See Comments)   unknown   Cephalosporins    Ciprofloxacin Other (See Comments)   unknown   Darvon [propoxyphene]    Flagyl [metronidazole] Other (See Comments)   unknown   Librax [chlordiazepoxide-clidinium] Other (See Comments)   unknown   Macrodantin [nitrofurantoin Macrocrystal]    Nitrofuran Derivatives Other (See Comments)   unknown   Other Other (See Comments)   unknown   Oxycodone-acetaminophen Other (See Comments)   unknown   Percocet [oxycodone-acetaminophen]    Quetiapine Other (See Comments)   unknown Potassium level dropped.   Sulfa Antibiotics    Sulfamethoxazole Other (See Comments)   unknown      Medication List       Accurate as of January 15, 2018 11:59 PM. Always use your most recent med list.        acetaminophen 325 MG tablet Commonly known as:  TYLENOL Take 650 mg by mouth every 8 (eight) hours as needed.   albuterol 108 (90 Base) MCG/ACT inhaler Commonly known as:  PROVENTIL HFA;VENTOLIN HFA Inhale 2 puffs into the lungs every 6 (six) hours as needed for wheezing.   cetirizine 10 MG tablet Commonly known as:  ZYRTEC Take 10 mg by mouth at bedtime.   clonazePAM 1 MG tablet Commonly known as:  Scarlette Calico  Give 1 mg by mouth at bedtime   D3-1000 25 MCG (1000 UT) tablet Generic drug:  Cholecalciferol Take 1,000 Units by mouth daily.   donepezil 10 MG tablet Commonly known as:  ARICEPT Take 10 mg by mouth at bedtime.   fluticasone 50 MCG/ACT nasal spray Commonly known as:  FLONASE Place into both nostrils daily.   guaiFENesin 100 MG/5ML liquid Commonly known as:  ROBITUSSIN Take 200 mg by mouth. TAKE 400MG  = 20 ML PO BID FOR COUGH   HYDROcodone-acetaminophen 5-325 MG tablet Commonly  known as:  NORCO/VICODIN Take 1 tablet by mouth every 8 (eight) hours as needed for moderate pain.   ipratropium-albuterol 0.5-2.5 (3) MG/3ML Soln Commonly known as:  DUONEB Take 3 mLs by nebulization every 4 (four) hours as needed.   levothyroxine 100 MCG tablet Commonly known as:  SYNTHROID, LEVOTHROID Take 100 mcg by mouth daily before breakfast.   oxybutynin 5 MG tablet Commonly known as:  DITROPAN Take 5 mg by mouth 3 (three) times daily.   pantoprazole 40 MG tablet Commonly known as:  PROTONIX Take 40 mg by mouth 2 (two) times daily.   ranitidine 75 MG tablet Commonly known as:  ZANTAC Take 75 mg by mouth at bedtime.   sodium chloride 0.65 % Soln nasal spray Commonly known as:  OCEAN Place 2 sprays into both nostrils 2 (two) times daily as needed. dry nasal membrane   theophylline 100 MG 12 hr tablet Commonly known as:  THEODUR Take 100 mg by mouth 2 (two) times daily. COPD   venlafaxine 37.5 MG tablet Commonly known as:  EFFEXOR Take 37.5 mg by mouth daily.       No orders of the defined types were placed in this encounter.   Immunization History  Administered Date(s) Administered  . Influenza, High Dose Seasonal PF 12/20/2014  . Influenza, Seasonal, Injecte, Preservative Fre 10/10/2014  . Influenza-Unspecified 12/07/2015, 11/21/2016  . PPD Test 02/10/2016, 02/26/2016  . Pneumococcal Conjugate-13 12/20/2014  . Pneumococcal Polysaccharide-23 02/07/2017  . Td 08/26/2014    Social History   Tobacco Use  . Smoking status: Former Smoker    Last attempt to quit: 12/17/1990    Years since quitting: 27.1  . Smokeless tobacco: Never Used  Substance Use Topics  . Alcohol use: No    Review of Systems  DATA OBTAINED: from patient, nurse GENERAL:  no fevers, fatigue, appetite changes SKIN: No itching, rash HEENT: No complaint RESPIRATORY: No cough, wheezing, SOB CARDIAC: No chest pain, palpitations, lower extremity edema  GI: No abdominal pain, No N/V/D  or constipation, No heartburn or reflux  GU: No dysuria, frequency or urgency, or incontinence  MUSCULOSKELETAL: No unrelieved bone/joint pain NEUROLOGIC: No headache, dizziness  PSYCHIATRIC: No overt anxiety or sadness  Vitals:   01/15/18 1120  BP: 125/77  Pulse: 74  Resp: 18  Temp: (!) 97 F (36.1 C)   Body mass index is 26.89 kg/m. Physical Exam  GENERAL APPEARANCE: Alert, conversant, No acute distress, dressed in matching outfit since she is on a daily basis SKIN: No diaphoresis rash HEENT: Unremarkable RESPIRATORY: Breathing is even, unlabored.   CARDIOVASCULAR: . No peripheral edema  GASTROINTESTINAL:  not distended.  GENITOURINARY: Bladder not distended  MUSCULOSKELETAL: No abnormal joints or musculature NEUROLOGIC: Cranial nerves 2-12 grossly intact. Moves all extremities PSYCHIATRIC: Mood and affect appropriate to situation, no behavioral issues  Patient Active Problem List   Diagnosis Date Noted  . Hypotension 08/31/2016  . Postural dizziness with presyncope 08/31/2016  . Chest  pain 08/31/2016  . Paroxysmal atrial fibrillation (HCC) 08/31/2016  . Hypokalemia 08/31/2016  . Depression 08/31/2016  . Vitamin D deficiency 08/31/2016  . Mood disorder (HCC) 08/22/2016  . PNA (pneumonia) 05/10/2016  . COPD exacerbation (HCC) 05/10/2016  . UTI (urinary tract infection) 05/10/2016  . Paroxysmal atrial fibrillation with RVR (HCC) 05/10/2016  . Metabolic encephalopathy 05/10/2016  . Elevated amylase and lipase 02/13/2016  . Acute encephalopathy 02/13/2016  . Psychosis (HCC) 02/13/2016  . Dementia with behavioral disturbance (HCC) 02/13/2016  . Overactive bladder 02/13/2016  . Chronic kidney disease   . COPD (chronic obstructive pulmonary disease) (HCC)   . Anemia   . Anxiety   . Barrett's esophagus   . Hypothyroidism   . GERD (gastroesophageal reflux disease)   . Hypertension   . Memory difficulties   . Osteoporosis   . Psoriasis (a type of skin inflammation)      CMP     Component Value Date/Time   NA 142 12/22/2017   NA 143 03/18/2017   K 3.9 12/22/2017   K 3.8 03/18/2017   CL 100 (L) 01/26/2016 1935   CO2 30 01/26/2016 1935   GLUCOSE 115 (H) 01/26/2016 1935   BUN 15 12/22/2017   CREATININE 0.6 12/22/2017   CREATININE 0.64 03/18/2017   CALCIUM 9.2 03/18/2017   ALBUMIN 3.8 03/18/2017   AST 13 12/22/2017   AST 9 03/18/2017   ALT 8 12/22/2017   ALT 7 03/18/2017   ALKPHOS 88 12/22/2017   ALKPHOS 99 03/18/2017   BILITOT 0.2 03/18/2017   GFRNONAA 85.41 03/18/2017   GFRAA >60 01/26/2016 1935   Recent Labs    03/18/17 12/22/17  NA 143  143 142  K 3.8  3.8 3.9  BUN 12 15  CREATININE 0.6  0.64 0.6  CALCIUM 9.2  --    Recent Labs    03/18/17 12/22/17  AST 9*  9 13  ALT 7  7 8   ALKPHOS 99  99 88  BILITOT 0.2  --   ALBUMIN 3.8  --    Recent Labs    03/18/17 12/22/17  WBC 5.6  5.6 5.4  HGB 11.4*  11.4 11.0*  HCT 35*  34.9 31*  PLT 203 236   Recent Labs    03/18/17 12/22/17  CHOL 178  178 185  LDLCALC 110 110  TRIG 156  156 203*   [facility]: [facility] Results  Component Value Date   TSH 3.88 12/22/2017   Lab Results  Component Value Date   HGBA1C 5.4 12/22/2017   Lab Results  Component Value Date   CHOL 185 12/22/2017   HDL 34 (A) 12/22/2017   LDLCALC 110 12/22/2017   TRIG 203 (A) 12/22/2017    Significant Diagnostic Results in last 30 days:  No results found.  Assessment and Plan  Paroxysmal atrial fibrillation (HCC) Patient in normal sinus rhythm; patient not on prophylaxis or any rate control medication; will monitor  Hypertension Controlled on no meds; continue to monitor  COPD (chronic obstructive pulmonary disease) (HCC) Continues without exacerbation; continue guaifenesin 200 mg twice daily, theophylline 100 mg twice daily and PRN albuterol and DuoNeb     Klaryssa Fauth D. Lyn HollingsheadAlexander, MD

## 2018-01-25 ENCOUNTER — Encounter: Payer: Self-pay | Admitting: Internal Medicine

## 2018-01-25 NOTE — Assessment & Plan Note (Signed)
Patient in normal sinus rhythm; patient not on prophylaxis or any rate control medication; will monitor

## 2018-01-25 NOTE — Assessment & Plan Note (Signed)
Continues without exacerbation; continue guaifenesin 200 mg twice daily, theophylline 100 mg twice daily and PRN albuterol and DuoNeb

## 2018-01-25 NOTE — Assessment & Plan Note (Signed)
Controlled on no meds; continue to monitor 

## 2018-01-27 DIAGNOSIS — D649 Anemia, unspecified: Secondary | ICD-10-CM | POA: Diagnosis not present

## 2018-01-27 DIAGNOSIS — I10 Essential (primary) hypertension: Secondary | ICD-10-CM | POA: Diagnosis not present

## 2018-01-27 DIAGNOSIS — E119 Type 2 diabetes mellitus without complications: Secondary | ICD-10-CM | POA: Diagnosis not present

## 2018-01-27 LAB — CBC AND DIFFERENTIAL
HEMATOCRIT: 31 — AB (ref 36–46)
Hemoglobin: 10.7 — AB (ref 12.0–16.0)
PLATELETS: 408 — AB (ref 150–399)
WBC: 6.3

## 2018-01-27 LAB — BASIC METABOLIC PANEL
BUN: 12 (ref 4–21)
Creatinine: 0.6 (ref 0.5–1.1)
Glucose: 103
Potassium: 4.1 (ref 3.4–5.3)
Sodium: 143 (ref 137–147)

## 2018-02-16 ENCOUNTER — Other Ambulatory Visit: Payer: Self-pay

## 2018-02-16 MED ORDER — CLONAZEPAM 1 MG PO TABS: ORAL_TABLET | ORAL | 2 refills | Status: DC

## 2018-02-17 ENCOUNTER — Non-Acute Institutional Stay (SKILLED_NURSING_FACILITY): Payer: Medicare Other | Admitting: Internal Medicine

## 2018-02-17 ENCOUNTER — Encounter: Payer: Self-pay | Admitting: Internal Medicine

## 2018-02-17 DIAGNOSIS — E034 Atrophy of thyroid (acquired): Secondary | ICD-10-CM | POA: Diagnosis not present

## 2018-02-17 DIAGNOSIS — K22719 Barrett's esophagus with dysplasia, unspecified: Secondary | ICD-10-CM

## 2018-02-17 DIAGNOSIS — K2100 Gastro-esophageal reflux disease with esophagitis, without bleeding: Secondary | ICD-10-CM

## 2018-02-17 NOTE — Progress Notes (Signed)
[facility] Room Number: 213W Place of Service:  SNF [phone]  Randon Goldsmithnne D. Lyn HollingsheadAlexander, MD  Patient Care Team: Margit HanksAlexander, Parv Manthey D, MD as PCP - General (Internal Medicine)  Extended Emergency Contact Information Primary Emergency Contact: Docia BarrierHopkins,Tammy          [name] [phone] [place] Home Phone: [phone] Relation: Niece Secondary Emergency Contact: [name],Donna  [place] Home Phone: [phone] Relation: Niece    Allergies: Azithromycin; Cephalexin; Cephalosporins; Ciprofloxacin; Darvon [propoxyphene]; Flagyl [metronidazole]; Librax [chlordiazepoxide-clidinium]; Macrodantin [nitrofurantoin macrocrystal]; Nitrofuran derivatives; Other; Oxycodone-acetaminophen; Percocet [oxycodone-acetaminophen]; Quetiapine; Sulfa antibiotics; and Sulfamethoxazole  Chief Complaint  Patient presents with  . Medical Management of Chronic Issues    Routine Visit    HPI: Patient is 78 y.o. female who is being seen for routine issues of Barrett's esophagus, GERD, and hypothyroidism.  Past Medical History:  Diagnosis Date  . Acute encephalopathy 02/13/2016  . Anemia   . Anxiety   . Arthritis   . Asthma   . Barrett's esophagus   . Chronic kidney disease   . COPD (chronic obstructive pulmonary disease) (HCC)   . Dementia (HCC)   . Dementia with behavioral disturbance (HCC) 02/13/2016  . Depression   . Disease of thyroid gland   . Diverticulitis   . GERD (gastroesophageal reflux disease)   . Hyperlipemia   . Hypertension   . Memory difficulties   . Osteoporosis   . Pancreatitis   . Paroxysmal atrial fibrillation with RVR (HCC) 05/10/2016  . Psoriasis (a type of skin inflammation)   . Vertigo   . Vitamin D deficiency 08/31/2016    Past Surgical History:  Procedure Laterality Date  . BACK SURGERY    . brain shun    . CHOLECYSTECTOMY    . ROTATOR CUFF REPAIR    . SHOULDER SURGERY    . UPPER GI ENDOSCOPY  12/19/2015   [facility], Include esophagus, stomach & duodenum or / Jejunum: Dx w/wo biopsys and dilatation; Surgeon : Leonia ReevesAlbert John Rhoton MD    Allergies as of 02/17/2018      Reactions   Azithromycin Other (See Comments)   unknown   Cephalexin Other (See Comments)   unknown   Cephalosporins    Ciprofloxacin Other (See Comments)   unknown   Darvon [propoxyphene]    Flagyl [metronidazole] Other (See Comments)   unknown   Librax [chlordiazepoxide-clidinium] Other (See Comments)   unknown   Macrodantin [nitrofurantoin Macrocrystal]    Nitrofuran Derivatives Other (See Comments)   unknown   Other Other (See Comments)   unknown   Oxycodone-acetaminophen Other (See Comments)   unknown   Percocet [oxycodone-acetaminophen]    Quetiapine Other (See Comments)   unknown Potassium level dropped.   Sulfa Antibiotics    Sulfamethoxazole Other (See Comments)   unknown      Medication List       Accurate as of February 17, 2018 11:59 PM. Always use your most recent med list.        acetaminophen 325 MG tablet Commonly known as:  TYLENOL Take 650 mg by mouth every 8 (eight) hours as needed.   albuterol 108 (90 Base) MCG/ACT inhaler Commonly known as:  PROVENTIL HFA;VENTOLIN HFA Inhale 2 puffs into the lungs every 6 (six) hours as needed for wheezing.   cetirizine 10 MG tablet Commonly known as:  ZYRTEC Take 10 mg by mouth at bedtime.   clonazePAM 1 MG tablet Commonly known as:  KLONOPIN Give  1 mg by mouth at bedtime   D3-1000 25 MCG (1000 UT) tablet Generic drug:  Cholecalciferol Take 1,000 Units by mouth daily.   donepezil 10 MG tablet Commonly known as:  ARICEPT Take 10 mg by mouth at bedtime.   fluticasone 50 MCG/ACT nasal spray Commonly known as:  FLONASE Place into both nostrils daily.   guaiFENesin 100 MG/5ML liquid Commonly known as:  ROBITUSSIN Take 200 mg by mouth. TAKE 400MG  = 20 ML PO BID FOR COUGH   HYDROcodone-acetaminophen 5-325 MG tablet Commonly known as:   NORCO/VICODIN Take 1 tablet by mouth every 8 (eight) hours as needed for moderate pain.   ipratropium-albuterol 0.5-2.5 (3) MG/3ML Soln Commonly known as:  DUONEB Take 3 mLs by nebulization every 4 (four) hours as needed.   levothyroxine 100 MCG tablet Commonly known as:  SYNTHROID, LEVOTHROID Take 100 mcg by mouth daily before breakfast.   oxybutynin 5 MG tablet Commonly known as:  DITROPAN Take 5 mg by mouth 3 (three) times daily.   pantoprazole 40 MG tablet Commonly known as:  PROTONIX Take 40 mg by mouth 2 (two) times daily.   ranitidine 75 MG tablet Commonly known as:  ZANTAC Take 75 mg by mouth at bedtime.   rOPINIRole 0.25 MG tablet Commonly known as:  REQUIP Take 0.25 mg by mouth. Take 2 tablets (0.5 mg) orally at bedtime fore restless legs.   sodium chloride 0.65 % Soln nasal spray Commonly known as:  OCEAN Place 2 sprays into both nostrils 2 (two) times daily as needed. dry nasal membrane   theophylline 100 MG 12 hr tablet Commonly known as:  THEODUR Take 100 mg by mouth 2 (two) times daily. COPD   venlafaxine 37.5 MG tablet Commonly known as:  EFFEXOR Take 37.5 mg by mouth daily.       No orders of the defined types were placed in this encounter.   Immunization History  Administered Date(s) Administered  . Influenza, High Dose Seasonal PF 12/20/2014  . Influenza, Seasonal, Injecte, Preservative Fre 10/10/2014  . Influenza-Unspecified 12/07/2015, 11/21/2016  . PPD Test 02/10/2016, 02/26/2016  . Pneumococcal Conjugate-13 12/20/2014  . Pneumococcal Polysaccharide-23 02/07/2017  . Td 08/26/2014    Social History   Tobacco Use  . Smoking status: Former Smoker    Last attempt to quit: 12/17/1990    Years since quitting: 27.1  . Smokeless tobacco: Never Used  Substance Use Topics  . Alcohol use: No    Review of Systems  DATA OBTAINED: from patient, nurse GENERAL:  no fevers, fatigue, appetite changes SKIN: No itching, rash HEENT: No  complaint RESPIRATORY: No cough, wheezing, SOB CARDIAC: No chest pain, palpitations, lower extremity edema  GI: No abdominal pain, No N/V/D or constipation, No heartburn or reflux  GU: No dysuria, frequency or urgency, or incontinence  MUSCULOSKELETAL: No unrelieved bone/joint pain NEUROLOGIC: No headache, dizziness  PSYCHIATRIC: No overt anxiety or sadness  Vitals:   02/17/18 1157  BP: 110/74  Pulse: 74  Resp: 16  Temp: 98 F (36.7 C)   Body mass index is 26.75 kg/m. Physical Exam  GENERAL APPEARANCE: Alert, conversant, No acute distress, out in hall daily with her walker, with matching outfit SKIN: No diaphoresis rash HEENT: Unremarkable RESPIRATORY: Breathing is even, unlabored. Lung sounds are clear   CARDIOVASCULAR: Heart RRR no murmurs, rubs or gallops. No peripheral edema  GASTROINTESTINAL: Abdomen is soft, non-tender, not distended w/ normal bowel sounds.  GENITOURINARY: Bladder non tender, not distended  MUSCULOSKELETAL: No abnormal joints or musculature  NEUROLOGIC: Cranial nerves 2-12 grossly intact. Moves all extremities PSYCHIATRIC: Mood and affect appropriate to situation, no behavioral issues  Patient Active Problem List   Diagnosis Date Noted  . Hypotension 08/31/2016  . Postural dizziness with presyncope 08/31/2016  . Chest pain 08/31/2016  . Paroxysmal atrial fibrillation (HCC) 08/31/2016  . Hypokalemia 08/31/2016  . Depression 08/31/2016  . Vitamin D deficiency 08/31/2016  . Mood disorder (HCC) 08/22/2016  . PNA (pneumonia) 05/10/2016  . COPD exacerbation (HCC) 05/10/2016  . UTI (urinary tract infection) 05/10/2016  . Paroxysmal atrial fibrillation with RVR (HCC) 05/10/2016  . Metabolic encephalopathy 05/10/2016  . Elevated amylase and lipase 02/13/2016  . Acute encephalopathy 02/13/2016  . Psychosis (HCC) 02/13/2016  . Dementia with behavioral disturbance (HCC) 02/13/2016  . Overactive bladder 02/13/2016  . Chronic kidney disease   . COPD  (chronic obstructive pulmonary disease) (HCC)   . Anemia   . Anxiety   . Barrett's esophagus   . Hypothyroidism   . GERD (gastroesophageal reflux disease)   . Hypertension   . Memory difficulties   . Osteoporosis   . Psoriasis (a type of skin inflammation)     CMP     Component Value Date/Time   NA 143 01/27/2018   NA 143 03/18/2017   K 4.1 01/27/2018   K 3.8 03/18/2017   CL 100 (L) 01/26/2016 1935   CO2 30 01/26/2016 1935   GLUCOSE 115 (H) 01/26/2016 1935   BUN 12 01/27/2018   CREATININE 0.6 01/27/2018   CREATININE 0.64 03/18/2017   CALCIUM 9.2 03/18/2017   ALBUMIN 3.8 03/18/2017   AST 13 12/22/2017   AST 9 03/18/2017   ALT 8 12/22/2017   ALT 7 03/18/2017   ALKPHOS 88 12/22/2017   ALKPHOS 99 03/18/2017   BILITOT 0.2 03/18/2017   GFRNONAA 85.41 03/18/2017   GFRAA >60 01/26/2016 1935   Recent Labs    03/18/17 12/22/17 01/27/18  NA 143  143 142 143  K 3.8  3.8 3.9 4.1  BUN 12 15 12   CREATININE 0.6  0.64 0.6 0.6  CALCIUM 9.2  --   --    Recent Labs    03/18/17 12/22/17  AST 9*  9 13  ALT 7  7 8   ALKPHOS 99  99 88  BILITOT 0.2  --   ALBUMIN 3.8  --    Recent Labs    03/18/17 12/22/17 01/27/18  WBC 5.6  5.6 5.4 6.3  HGB 11.4*  11.4 11.0* 10.7*  HCT 35*  34.9 31* 31*  PLT 203 236 408*   Recent Labs    03/18/17 12/22/17  CHOL 178  178 185  LDLCALC 110 110  TRIG 156  156 203*   No results found for: [facility] Lab Results  Component Value Date   TSH 3.88 12/22/2017   Lab Results  Component Value Date   HGBA1C 5.4 12/22/2017   Lab Results  Component Value Date   CHOL 185 12/22/2017   HDL 34 (A) 12/22/2017   LDLCALC 110 12/22/2017   TRIG 203 (A) 12/22/2017    Significant Diagnostic Results in last 30 days:  No results found.  Assessment and Plan  Barrett's esophagus GERD remains controlled; continue Protonix 40 mg twice daily and Zantac 75 mg nightly  GERD (gastroesophageal reflux disease) No complaints of reflux; continue  Protonix 40 mg twice daily and Zantac 75 mg nightly  Hypothyroidism Most recent TSH is 3.88, good control; continue Synthroid 100 mcg daily    Corda Shutt D. Lyn HollingsheadAlexander,  MD     

## 2018-02-18 ENCOUNTER — Encounter: Payer: Self-pay | Admitting: Internal Medicine

## 2018-02-18 NOTE — Assessment & Plan Note (Signed)
No complaints of reflux; continue Protonix 40 mg twice daily and Zantac 75 mg nightly

## 2018-02-18 NOTE — Assessment & Plan Note (Signed)
Most recent TSH is 3.88, good control; continue Synthroid 100 mcg daily

## 2018-02-18 NOTE — Assessment & Plan Note (Signed)
GERD remains controlled; continue Protonix 40 mg twice daily and Zantac 75 mg nightly

## 2018-03-05 ENCOUNTER — Other Ambulatory Visit: Payer: Self-pay

## 2018-03-10 ENCOUNTER — Other Ambulatory Visit: Payer: Self-pay

## 2018-03-18 ENCOUNTER — Encounter: Payer: Self-pay | Admitting: Internal Medicine

## 2018-03-18 ENCOUNTER — Non-Acute Institutional Stay (SKILLED_NURSING_FACILITY): Payer: Medicare Other | Admitting: Internal Medicine

## 2018-03-18 DIAGNOSIS — N3281 Overactive bladder: Secondary | ICD-10-CM

## 2018-03-18 DIAGNOSIS — F0281 Dementia in other diseases classified elsewhere with behavioral disturbance: Secondary | ICD-10-CM | POA: Diagnosis not present

## 2018-03-18 DIAGNOSIS — G301 Alzheimer's disease with late onset: Secondary | ICD-10-CM | POA: Diagnosis not present

## 2018-03-18 DIAGNOSIS — F419 Anxiety disorder, unspecified: Secondary | ICD-10-CM

## 2018-03-18 DIAGNOSIS — F02818 Dementia in other diseases classified elsewhere, unspecified severity, with other behavioral disturbance: Secondary | ICD-10-CM

## 2018-03-18 NOTE — Progress Notes (Signed)
Location[facility] Room Number: [id number] Place of Service:  SNF [phone])  Rachel Dickerson. Rachel Hollingshead, MD  Patient Care Team: [name], MD as PCP - General (Internal Medicine)  Extended Emergency Contact Information Primary Emergency Contact: [name] [phone] [name] Home Phone: [phone] Relation: Niece Secondary Emergency Contact: Dorice Lamas [place] Home Phone: [phone] Relation: Niece    Allergies: Azithromycin; Cephalexin; Cephalosporins; Ciprofloxacin; Darvon [propoxyphene]; Flagyl [metronidazole]; Librax [chlordiazepoxide-clidinium]; Macrodantin [nitrofurantoin macrocrystal]; Nitrofuran derivatives; Other; Oxycodone-acetaminophen; Percocet [oxycodone-acetaminophen]; Quetiapine; Sulfa antibiotics; and Sulfamethoxazole  Chief Complaint  Patient presents with  . Medical Management of Chronic Issues    Routine visit    HPI: Patient is 78 y.o. female who is being seen for routine issues of dementia, overactive bladder syndrome, and anxiety.  Past Medical History:  Diagnosis Date  . Acute encephalopathy 02/13/2016  . Anemia   . Anxiety   . Arthritis   . Asthma   . Barrett's esophagus   . Chronic kidney disease   . COPD (chronic obstructive pulmonary disease) (HCC)   . Dementia (HCC)   . Dementia with behavioral disturbance (HCC) 02/13/2016  . Depression   . Disease of thyroid gland   . Diverticulitis   . GERD (gastroesophageal reflux disease)   . Hyperlipemia   . Hypertension   . Memory difficulties   . Osteoporosis   . Pancreatitis   . Paroxysmal atrial fibrillation with RVR (HCC) 05/10/2016  . Psoriasis (a type of skin inflammation)   . Vertigo   . Vitamin D deficiency 08/31/2016    Past Surgical History:  Procedure Laterality Date  . BACK SURGERY    . brain shun    . CHOLECYSTECTOMY    . ROTATOR CUFF REPAIR    . SHOULDER SURGERY    . UPPER GI ENDOSCOPY  12/19/2015    [facility], Include esophagus, stomach & duodenum or / Jejunum: Dx w/wo biopsys and dilatation; Surgeon : Leonia Reeves Rhoton MD    Allergies as of 03/18/2018      Reactions   Azithromycin Other (See Comments)   unknown   Cephalexin Other (See Comments)   unknown   Cephalosporins    Ciprofloxacin Other (See Comments)   unknown   Darvon [propoxyphene]    Flagyl [metronidazole] Other (See Comments)   unknown   Librax [chlordiazepoxide-clidinium] Other (See Comments)   unknown   Macrodantin [nitrofurantoin Macrocrystal]    Nitrofuran Derivatives Other (See Comments)   unknown   Other Other (See Comments)   unknown   Oxycodone-acetaminophen Other (See Comments)   unknown   Percocet [oxycodone-acetaminophen]    Quetiapine Other (See Comments)   unknown Potassium level dropped.   Sulfa Antibiotics    Sulfamethoxazole Other (See Comments)   unknown      Medication List       Accurate as of March 18, 2018 11:59 PM. Always use your most recent med list.        acetaminophen 325 MG tablet Commonly known as:  TYLENOL Take 650 mg by mouth every 8 (eight) hours as needed.   albuterol 108 (90 Base) MCG/ACT inhaler Commonly known as:  PROVENTIL HFA;VENTOLIN HFA Inhale 2 puffs into the lungs every 6 (six) hours as needed for wheezing.   cetirizine 10 MG tablet Commonly known as:  ZYRTEC Take 10 mg by mouth at bedtime.   clonazePAM 1 MG tablet Commonly known as:  KLONOPIN Give 1 mg by mouth at bedtime   D3-1000 25 MCG (1000 UT) tablet Generic drug:  Cholecalciferol Take 1,000 Units by mouth daily.   donepezil 10 MG tablet Commonly known as:  ARICEPT Take 10 mg by mouth at bedtime.   fluticasone 50 MCG/ACT nasal spray Commonly known as:  FLONASE Place into both nostrils daily.   guaiFENesin 100 MG/5ML liquid Commonly known as:  ROBITUSSIN Take 200 mg by mouth. TAKE 400MG  = 20 ML PO BID FOR COUGH   HYDROcodone-acetaminophen 5-325 MG tablet Commonly known as:   NORCO/VICODIN Take 1 tablet by mouth every 8 (eight) hours as needed for moderate pain.   ipratropium-albuterol 0.5-2.5 (3) MG/3ML Soln Commonly known as:  DUONEB Take 3 mLs by nebulization every 4 (four) hours as needed.   levothyroxine 100 MCG tablet Commonly known as:  SYNTHROID, LEVOTHROID Take 100 mcg by mouth daily before breakfast.   oxybutynin 5 MG tablet Commonly known as:  DITROPAN Take 5 mg by mouth 3 (three) times daily.   pantoprazole 40 MG tablet Commonly known as:  PROTONIX Take 40 mg by mouth 2 (two) times daily.   ranitidine 75 MG tablet Commonly known as:  ZANTAC Take 75 mg by mouth at bedtime.   rOPINIRole 0.25 MG tablet Commonly known as:  REQUIP Take 0.25 mg by mouth. Take 2 tablets (0.5 mg) orally at bedtime fore restless legs.   sodium chloride 0.65 % Soln nasal spray Commonly known as:  OCEAN Place 2 sprays into both nostrils 2 (two) times daily as needed. dry nasal membrane   theophylline 100 MG 12 hr tablet Commonly known as:  THEODUR Take 100 mg by mouth 2 (two) times daily. COPD   venlafaxine 37.5 MG tablet Commonly known as:  EFFEXOR Take 37.5 mg by mouth daily.       No orders of the defined types were placed in this encounter.   Immunization History  Administered Date(s) Administered  . Influenza, High Dose Seasonal PF 12/20/2014  . Influenza, Seasonal, Injecte, Preservative Fre 10/10/2014  . Influenza-Unspecified 12/07/2015, 11/21/2016  . PPD Test 02/10/2016, 02/26/2016  . Pneumococcal Conjugate-13 12/20/2014  . Pneumococcal Polysaccharide-23 02/07/2017  . Td 08/26/2014    Social History   Tobacco Use  . Smoking status: Former Smoker    Last attempt to quit: 12/17/1990    Years since quitting: 27.2  . Smokeless tobacco: Never Used  Substance Use Topics  . Alcohol use: No    Review of Systems  DATA OBTAINED: from patient, nurse GENERAL:  no fevers, fatigue, appetite changes SKIN: No itching, rash HEENT: No  complaint RESPIRATORY: No cough, wheezing, SOB CARDIAC: No chest pain, palpitations, lower extremity edema  GI: No abdominal pain, No N/V/D or constipation, No heartburn or reflux  GU: No dysuria, frequency or urgency, or incontinence  MUSCULOSKELETAL: No unrelieved bone/joint pain NEUROLOGIC: No headache, dizziness  PSYCHIATRIC: No overt anxiety or sadness  Vitals:   03/18/18 1335  BP: 111/75  Pulse: 85  Resp: 20  Temp: 98 F (36.7 C)   Body mass index is 25.93 kg/m. Physical Exam  GENERAL APPEARANCE: Alert, conversant, No acute distress; in hall daily walking with her walker SKIN: No diaphoresis rash HEENT: Unremarkable RESPIRATORY: Breathing is even, unlabored. Lung sounds are clear   CARDIOVASCULAR: Heart RRR no murmurs, rubs or gallops. No peripheral edema  GASTROINTESTINAL: Abdomen is soft, non-tender, not distended w/ normal bowel sounds.  GENITOURINARY: Bladder non tender, not distended  MUSCULOSKELETAL: No abnormal joints or musculature NEUROLOGIC:  Cranial nerves 2-12 grossly intact. Moves all extremities PSYCHIATRIC: Mood and affect appropriate to situation, no behavioral issues  Patient Active Problem List   Diagnosis Date Noted  . Hypotension 08/31/2016  . Postural dizziness with presyncope 08/31/2016  . Chest pain 08/31/2016  . Paroxysmal atrial fibrillation (HCC) 08/31/2016  . Hypokalemia 08/31/2016  . Depression 08/31/2016  . Vitamin D deficiency 08/31/2016  . Mood disorder (HCC) 08/22/2016  . PNA (pneumonia) 05/10/2016  . COPD exacerbation (HCC) 05/10/2016  . UTI (urinary tract infection) 05/10/2016  . Paroxysmal atrial fibrillation with RVR (HCC) 05/10/2016  . Metabolic encephalopathy 05/10/2016  . Elevated amylase and lipase 02/13/2016  . Acute encephalopathy 02/13/2016  . Psychosis (HCC) 02/13/2016  . Dementia with behavioral disturbance (HCC) 02/13/2016  . Overactive bladder 02/13/2016  . Chronic kidney disease   . COPD (chronic obstructive  pulmonary disease) (HCC)   . Anemia   . Anxiety   . Barrett's esophagus   . Hypothyroidism   . GERD (gastroesophageal reflux disease)   . Hypertension   . Memory difficulties   . Osteoporosis   . Psoriasis (a type of skin inflammation)     CMP     Component Value Date/Time   NA 143 01/27/2018   NA 143 03/18/2017   K 4.1 01/27/2018   K 3.8 03/18/2017   CL 100 (L) 01/26/2016 1935   CO2 30 01/26/2016 1935   GLUCOSE 115 (H) 01/26/2016 1935   BUN 12 01/27/2018   CREATININE 0.6 01/27/2018   CREATININE 0.64 03/18/2017   CALCIUM 9.2 03/18/2017   ALBUMIN 3.8 03/18/2017   AST 13 12/22/2017   AST 9 03/18/2017   ALT 8 12/22/2017   ALT 7 03/18/2017   ALKPHOS 88 12/22/2017   ALKPHOS 99 03/18/2017   BILITOT 0.2 03/18/2017   GFRNONAA 85.41 03/18/2017   GFRAA >60 01/26/2016 1935   Recent Labs    12/22/17 01/27/18  NA 142 143  K 3.9 4.1  BUN 15 12  CREATININE 0.6 0.6   Recent Labs    12/22/17  AST 13  ALT 8  ALKPHOS 88   Recent Labs    12/22/17 01/27/18  WBC 5.4 6.3  HGB 11.0* 10.7*  HCT 31* 31*  PLT 236 408*   Recent Labs    12/22/17  CHOL 185  LDLCALC 110  TRIG 203*   No results found for: [facility] Lab Results  Component Value Date   TSH 3.88 12/22/2017   Lab Results  Component Value Date   HGBA1C 5.4 12/22/2017   Lab Results  Component Value Date   CHOL 185 12/22/2017   HDL 34 (A) 12/22/2017   LDLCALC 110 12/22/2017   TRIG 203 (A) 12/22/2017    Significant Diagnostic Results in last 30 days:  No results found.  Assessment and Plan  Dementia with behavioral disturbance Well-controlled; continue Aricept 10 mg daily  Overactive bladder No reports of problems or infections; continue Ditropan 5 mg 3 times daily  Anxiety No reported problems; continue clonazepam 1 mg nightly     Lorence Nagengast D. Rachel Hollingshead, MD

## 2018-03-21 ENCOUNTER — Encounter: Payer: Self-pay | Admitting: Internal Medicine

## 2018-03-21 NOTE — Assessment & Plan Note (Signed)
No reports of problems or infections; continue Ditropan 5 mg 3 times daily 

## 2018-03-21 NOTE — Assessment & Plan Note (Signed)
No reported problems; continue clonazepam 1 mg nightly

## 2018-03-21 NOTE — Assessment & Plan Note (Signed)
Well-controlled; continue Aricept 10 mg daily

## 2018-04-17 ENCOUNTER — Non-Acute Institutional Stay (SKILLED_NURSING_FACILITY): Payer: Medicare Other | Admitting: Internal Medicine

## 2018-04-17 ENCOUNTER — Encounter: Payer: Self-pay | Admitting: Internal Medicine

## 2018-04-17 DIAGNOSIS — J449 Chronic obstructive pulmonary disease, unspecified: Secondary | ICD-10-CM | POA: Diagnosis not present

## 2018-04-17 DIAGNOSIS — I480 Paroxysmal atrial fibrillation: Secondary | ICD-10-CM

## 2018-04-17 DIAGNOSIS — I10 Essential (primary) hypertension: Secondary | ICD-10-CM

## 2018-04-17 NOTE — Progress Notes (Signed)
Location:  [facility] Room Number: [address]W Place of Service:  SNF [phone])  Rachel Dickerson. [name], MD  Patient Care Team: [name], MD as PCP - General (Internal Medicine)  Extended Emergency Contact Information Primary Emergency Contact: [name] [phone] [name] Home Phone: [phone] Relation: Niece Secondary Emergency Contact: Dorice Lamas [place] of [place] Home Phone: [phone] Relation: Niece    Allergies: Azithromycin; Cephalexin; Cephalosporins; Ciprofloxacin; Darvon [propoxyphene]; Flagyl [metronidazole]; Librax [chlordiazepoxide-clidinium]; Macrodantin [nitrofurantoin macrocrystal]; Nitrofuran derivatives; Other; Oxycodone-acetaminophen; Percocet [oxycodone-acetaminophen]; Quetiapine; Sulfa antibiotics; and Sulfamethoxazole  Chief Complaint  Patient presents with  . Medical Management of Chronic Issues    Routine visit    HPI: Patient is 78 y.o. female who is being seen for routine issues of hypertension, paroxysmal atrial fibrillation and COPD.  Past Medical History:  Diagnosis Date  . Acute encephalopathy 02/13/2016  . Anemia   . Anxiety   . Arthritis   . Asthma   . Barrett's esophagus   . Chronic kidney disease   . COPD (chronic obstructive pulmonary disease) (HCC)   . Dementia (HCC)   . Dementia with behavioral disturbance (HCC) 02/13/2016  . Depression   . Disease of thyroid gland   . Diverticulitis   . GERD (gastroesophageal reflux disease)   . Hyperlipemia   . Hypertension   . Memory difficulties   . Osteoporosis   . Pancreatitis   . Paroxysmal atrial fibrillation with RVR (HCC) 05/10/2016  . Psoriasis (a type of skin inflammation)   . Vertigo   . Vitamin D deficiency 08/31/2016    Past Surgical History:  Procedure Laterality Date  . BACK SURGERY    . brain shun    . CHOLECYSTECTOMY    . ROTATOR CUFF REPAIR    . SHOULDER SURGERY    . UPPER GI ENDOSCOPY   12/19/2015   [facility], Include esophagus, stomach & duodenum or / Jejunum: Dx w/wo biopsys and dilatation; Surgeon : Leonia Reeves Rhoton MD    Allergies as of 04/17/2018      Reactions   Azithromycin Other (See Comments)   unknown   Cephalexin Other (See Comments)   unknown   Cephalosporins    Ciprofloxacin Other (See Comments)   unknown   Darvon [propoxyphene]    Flagyl [metronidazole] Other (See Comments)   unknown   Librax [chlordiazepoxide-clidinium] Other (See Comments)   unknown   Macrodantin [nitrofurantoin Macrocrystal]    Nitrofuran Derivatives Other (See Comments)   unknown   Other Other (See Comments)   unknown   Oxycodone-acetaminophen Other (See Comments)   unknown   Percocet [oxycodone-acetaminophen]    Quetiapine Other (See Comments)   unknown Potassium level dropped.   Sulfa Antibiotics    Sulfamethoxazole Other (See Comments)   unknown      Medication List       Accurate as of April 17, 2018 11:59 PM. Always use your most recent med list.        acetaminophen 325 MG tablet Commonly known as:  TYLENOL Take 650 mg by mouth every 8 (eight) hours as needed.   albuterol 108 (90 Base) MCG/ACT inhaler Commonly known as:  PROVENTIL HFA;VENTOLIN HFA Inhale 2 puffs into the lungs every 6 (six) hours as needed for wheezing.   cetirizine 10 MG tablet Commonly known as:  ZYRTEC Take 10 mg by mouth at bedtime.   clonazePAM 1 MG tablet Commonly known as:  KLONOPIN Give 1 mg by mouth at bedtime   D3-1000 25 MCG (1000 UT) tablet Generic drug:  Cholecalciferol Take 1,000 Units by mouth daily.   donepezil 10 MG tablet Commonly known as:  ARICEPT Take 10 mg by mouth at bedtime.   fluticasone 50 MCG/ACT nasal spray Commonly known as:  FLONASE Place into both nostrils daily.   guaiFENesin 100 MG/5ML liquid Commonly known as:  ROBITUSSIN Take 200 mg by mouth. TAKE 400MG  = 20 ML PO BID FOR COUGH   HYDROcodone-acetaminophen 5-325 MG tablet Commonly known  as:  NORCO/VICODIN Take 1 tablet by mouth every 8 (eight) hours as needed for moderate pain.   ipratropium-albuterol 0.5-2.5 (3) MG/3ML Soln Commonly known as:  DUONEB Take 3 mLs by nebulization every 4 (four) hours as needed.   levothyroxine 100 MCG tablet Commonly known as:  SYNTHROID, LEVOTHROID Take 100 mcg by mouth daily before breakfast.   oxybutynin 5 MG tablet Commonly known as:  DITROPAN Take 5 mg by mouth 3 (three) times daily.   pantoprazole 40 MG tablet Commonly known as:  PROTONIX Take 40 mg by mouth 2 (two) times daily.   ranitidine 75 MG tablet Commonly known as:  ZANTAC Take 75 mg by mouth at bedtime.   rOPINIRole 0.25 MG tablet Commonly known as:  REQUIP Take 0.25 mg by mouth. Take 2 tablets (0.5 mg) orally at bedtime fore restless legs.   sodium chloride 0.65 % Soln nasal spray Commonly known as:  OCEAN Place 2 sprays into both nostrils 2 (two) times daily as needed. dry nasal membrane   theophylline 100 MG 12 hr tablet Commonly known as:  THEODUR Take 100 mg by mouth 2 (two) times daily. COPD   venlafaxine 37.5 MG tablet Commonly known as:  EFFEXOR Take 37.5 mg by mouth daily.       No orders of the defined types were placed in this encounter.   Immunization History  Administered Date(s) Administered  . Influenza, High Dose Seasonal PF 12/20/2014  . Influenza, Seasonal, Injecte, Preservative Fre 10/10/2014  . Influenza-Unspecified 12/07/2015, 11/21/2016  . PPD Test 02/10/2016, 02/26/2016  . Pneumococcal Conjugate-13 12/20/2014  . Pneumococcal Polysaccharide-23 02/07/2017  . Td 08/26/2014    Social History   Tobacco Use  . Smoking status: Former Smoker    Last attempt to quit: 12/17/1990    Years since quitting: 27.3  . Smokeless tobacco: Never Used  Substance Use Topics  . Alcohol use: No    Review of Systems  DATA OBTAINED: from patient, nurse GENERAL:  no fevers, fatigue, appetite changes SKIN: No itching, rash HEENT: No  complaint RESPIRATORY: No cough, wheezing, SOB CARDIAC: No chest pain, palpitations, lower extremity edema  GI: No abdominal pain, No N/V/D or constipation, No heartburn or reflux  GU: No dysuria, frequency or urgency, or incontinence  MUSCULOSKELETAL: No unrelieved bone/joint pain NEUROLOGIC: No headache, dizziness  PSYCHIATRIC: No overt anxiety or sadness  Vitals:   04/17/18 1526  BP: 120/81  Pulse: 87  Resp: 20  Temp: 98.9 F (37.2 C)   Body mass index is 26.39 kg/m. Physical Exam  GENERAL APPEARANCE: Alert, conversant, No acute distress; same daily in the hall walking and socializing SKIN: No diaphoresis rash HEENT: Unremarkable RESPIRATORY: Breathing is even, unlabored.  CARDIOVASCULAR:  No peripheral edema  GASTROINTESTINAL: Abdomen is not distended  GENITOURINARY: Bladder  not distended  MUSCULOSKELETAL: No abnormal joints or musculature NEUROLOGIC: Cranial nerves 2-12 grossly intact. Moves all extremities PSYCHIATRIC: Mood and affect appropriate to situation, no behavioral  issues  Patient Active Problem List   Diagnosis Date Noted  . Hypotension 08/31/2016  . Postural dizziness with presyncope 08/31/2016  . Chest pain 08/31/2016  . Paroxysmal atrial fibrillation (HCC) 08/31/2016  . Hypokalemia 08/31/2016  . Depression 08/31/2016  . Vitamin D deficiency 08/31/2016  . Mood disorder (HCC) 08/22/2016  . PNA (pneumonia) 05/10/2016  . COPD exacerbation (HCC) 05/10/2016  . UTI (urinary tract infection) 05/10/2016  . Paroxysmal atrial fibrillation with RVR (HCC) 05/10/2016  . Metabolic encephalopathy 05/10/2016  . Elevated amylase and lipase 02/13/2016  . Acute encephalopathy 02/13/2016  . Psychosis (HCC) 02/13/2016  . Dementia with behavioral disturbance (HCC) 02/13/2016  . Overactive bladder 02/13/2016  . Chronic kidney disease   . COPD (chronic obstructive pulmonary disease) (HCC)   . Anemia   . Anxiety   . Barrett's esophagus   . Hypothyroidism   . GERD  (gastroesophageal reflux disease)   . Hypertension   . Memory difficulties   . Osteoporosis   . Psoriasis (a type of skin inflammation)     CMP     Component Value Date/Time   NA 143 01/27/2018   NA 143 03/18/2017   K 4.1 01/27/2018   K 3.8 03/18/2017   CL 100 (L) 01/26/2016 1935   CO2 30 01/26/2016 1935   GLUCOSE 115 (H) 01/26/2016 1935   BUN 12 01/27/2018   CREATININE 0.6 01/27/2018   CREATININE 0.64 03/18/2017   CALCIUM 9.2 03/18/2017   ALBUMIN 3.8 03/18/2017   AST 13 12/22/2017   AST 9 03/18/2017   ALT 8 12/22/2017   ALT 7 03/18/2017   ALKPHOS 88 12/22/2017   ALKPHOS 99 03/18/2017   BILITOT 0.2 03/18/2017   GFRNONAA 85.41 03/18/2017   GFRAA >60 01/26/2016 1935   Recent Labs    12/22/17 01/27/18  NA 142 143  K 3.9 4.1  BUN 15 12  CREATININE 0.6 0.6   Recent Labs    12/22/17  AST 13  ALT 8  ALKPHOS 88   Recent Labs    12/22/17 01/27/18  WBC 5.4 6.3  HGB 11.0* 10.7*  HCT 31* 31*  PLT 236 408*   Recent Labs    12/22/17  CHOL 185  LDLCALC 110  TRIG 203*   No results found for: [facility] Lab Results  Component Value Date   TSH 3.88 12/22/2017   Lab Results  Component Value Date   HGBA1C 5.4 12/22/2017   Lab Results  Component Value Date   CHOL 185 12/22/2017   HDL 34 (A) 12/22/2017   LDLCALC 110 12/22/2017   TRIG 203 (A) 12/22/2017    Significant Diagnostic Results in last 30 days:  No results found.  Assessment and Plan  Hypertension Controlled on no meds; continue to monitor  Paroxysmal atrial fibrillation (HCC) Patient is continued normal rhythm; patient is on no meds for rate or prophylaxis; continue to monitor  COPD (chronic obstructive pulmonary disease) (HCC) Continues without exacerbation; continue guaifenesin 200 mg twice daily, theophylline 100 mg twice daily and PRN DuoNeb and albuterol     Rachel Dickerson. [name], MD

## 2018-04-19 ENCOUNTER — Encounter: Payer: Self-pay | Admitting: Internal Medicine

## 2018-04-19 NOTE — Assessment & Plan Note (Signed)
Controlled on no meds; continue to monitor 

## 2018-04-19 NOTE — Assessment & Plan Note (Signed)
Continues without exacerbation; continue guaifenesin 200 mg twice daily, theophylline 100 mg twice daily and PRN DuoNeb and albuterol

## 2018-04-19 NOTE — Assessment & Plan Note (Signed)
Patient is continued normal rhythm; patient is on no meds for rate or prophylaxis; continue to monitor

## 2018-04-20 ENCOUNTER — Non-Acute Institutional Stay (SKILLED_NURSING_FACILITY): Payer: Medicare Other | Admitting: Internal Medicine

## 2018-04-20 ENCOUNTER — Encounter: Payer: Self-pay | Admitting: Internal Medicine

## 2018-04-20 DIAGNOSIS — G2581 Restless legs syndrome: Secondary | ICD-10-CM | POA: Diagnosis not present

## 2018-04-20 DIAGNOSIS — H6123 Impacted cerumen, bilateral: Secondary | ICD-10-CM | POA: Diagnosis not present

## 2018-04-20 NOTE — Progress Notes (Signed)
:  Location:  [facility] Room Number: [id number] Place of Service:  SNF ([id number])  Randon Goldsmith. [name], MD  Patient Care Team: [name], MD as PCP - General (Internal Medicine)  Extended Emergency Contact Information Primary Emergency Contact: [name] [phone] [name] of [place] Home Phone: [phone] Relation: Niece Secondary Emergency Contact: Dorice Lamas [place] Home Phone: [phone] Relation: Niece     Allergies: Azithromycin; Cephalexin; Cephalosporins; Ciprofloxacin; Darvon [propoxyphene]; Flagyl [metronidazole]; Librax [chlordiazepoxide-clidinium]; Macrodantin [nitrofurantoin macrocrystal]; Nitrofuran derivatives; Other; Oxycodone-acetaminophen; Percocet [oxycodone-acetaminophen]; Quetiapine; Sulfa antibiotics; and Sulfamethoxazole  Chief Complaint  Patient presents with  . Acute Visit    HPI: Patient is 78 y.o. female who nursing asked me to see for 2 concerns one is she is having symptoms of restless leg during the day that is no longer being controlled by her nightly dose and also that she has decreased hearing in the nurses would like Debrox.  Past Medical History:  Diagnosis Date  . Acute encephalopathy 02/13/2016  . Anemia   . Anxiety   . Arthritis   . Asthma   . Barrett's esophagus   . Chronic kidney disease   . COPD (chronic obstructive pulmonary disease) (HCC)   . Dementia (HCC)   . Dementia with behavioral disturbance (HCC) 02/13/2016  . Depression   . Disease of thyroid gland   . Diverticulitis   . GERD (gastroesophageal reflux disease)   . Hyperlipemia   . Hypertension   . Memory difficulties   . Osteoporosis   . Pancreatitis   . Paroxysmal atrial fibrillation with RVR (HCC) 05/10/2016  . Psoriasis (a type of skin inflammation)   . Vertigo   . Vitamin D deficiency 08/31/2016    Past Surgical History:  Procedure Laterality Date  . BACK SURGERY    . brain shun    .  CHOLECYSTECTOMY    . ROTATOR CUFF REPAIR    . SHOULDER SURGERY    . UPPER GI ENDOSCOPY  12/19/2015   [facility] End[facility], Include esophagus, stomach & duodenum or / Jejunum: Dx w/wo biopsys and dilatation; Surgeon : Leonia Reeves Rhoton MD    Allergies as of 04/20/2018      Reactions   Azithromycin Other (See Comments)   unknown   Cephalexin Other (See Comments)   unknown   Cephalosporins    Ciprofloxacin Other (See Comments)   unknown   Darvon [propoxyphene]    Flagyl [metronidazole] Other (See Comments)   unknown   Librax [chlordiazepoxide-clidinium] Other (See Comments)   unknown   Macrodantin [nitrofurantoin Macrocrystal]    Nitrofuran Derivatives Other (See Comments)   unknown   Other Other (See Comments)   unknown   Oxycodone-acetaminophen Other (See Comments)   unknown   Percocet [oxycodone-acetaminophen]    Quetiapine Other (See Comments)   unknown Potassium level dropped.   Sulfa Antibiotics    Sulfamethoxazole Other (See Comments)   unknown      Medication List       Accurate as of April 20, 2018  2:45 PM. Always use your most recent med list.        acetaminophen 325 MG tablet Commonly known as:  TYLENOL Take 650 mg by mouth every 8 (eight) hours as needed.   albuterol 108 (90 Base) MCG/ACT inhaler Commonly known as:  PROVENTIL HFA;VENTOLIN HFA Inhale 2 puffs into the lungs every 6 (six) hours as needed for wheezing.   cetirizine  10 MG tablet Commonly known as:  ZYRTEC Take 10 mg by mouth at bedtime.   clonazePAM 1 MG tablet Commonly known as:  KLONOPIN Give 1 mg by mouth at bedtime   D3-1000 25 MCG (1000 UT) tablet Generic drug:  Cholecalciferol Take 1,000 Units by mouth daily.   donepezil 10 MG tablet Commonly known as:  ARICEPT Take 10 mg by mouth at bedtime.   fluticasone 50 MCG/ACT nasal spray Commonly known as:  FLONASE Place into both nostrils daily.   guaiFENesin 100 MG/5ML liquid Commonly known as:  ROBITUSSIN Take 200 mg by mouth. TAKE  400MG  = 20 ML PO BID FOR COUGH   HYDROcodone-acetaminophen 5-325 MG tablet Commonly known as:  NORCO/VICODIN Take 1 tablet by mouth every 8 (eight) hours as needed for moderate pain.   ipratropium-albuterol 0.5-2.5 (3) MG/3ML Soln Commonly known as:  DUONEB Take 3 mLs by nebulization every 4 (four) hours as needed.   levothyroxine 100 MCG tablet Commonly known as:  SYNTHROID, LEVOTHROID Take 100 mcg by mouth daily before breakfast.   oxybutynin 5 MG tablet Commonly known as:  DITROPAN Take 5 mg by mouth 3 (three) times daily.   pantoprazole 40 MG tablet Commonly known as:  PROTONIX Take 40 mg by mouth 2 (two) times daily.   ranitidine 75 MG tablet Commonly known as:  ZANTAC Take 75 mg by mouth at bedtime.   rOPINIRole 0.25 MG tablet Commonly known as:  REQUIP Take 0.25 mg by mouth. Take 2 tablets (0.5 mg) orally at bedtime fore restless legs.   sodium chloride 0.65 % Soln nasal spray Commonly known as:  OCEAN Place 2 sprays into both nostrils 2 (two) times daily as needed. dry nasal membrane   theophylline 100 MG 12 hr tablet Commonly known as:  THEODUR Take 100 mg by mouth 2 (two) times daily. COPD   venlafaxine 37.5 MG tablet Commonly known as:  EFFEXOR Take 37.5 mg by mouth daily.       No orders of the defined types were placed in this encounter.   Immunization History  Administered Date(s) Administered  . Influenza, High Dose Seasonal PF 12/20/2014  . Influenza, Seasonal, Injecte, Preservative Fre 10/10/2014  . Influenza-Unspecified 12/07/2015, 11/21/2016  . PPD Test 02/10/2016, 02/26/2016  . Pneumococcal Conjugate-13 12/20/2014  . Pneumococcal Polysaccharide-23 02/07/2017  . Td 08/26/2014    Social History   Tobacco Use  . Smoking status: Former Smoker    Last attempt to quit: 12/17/1990    Years since quitting: 27.3  . Smokeless tobacco: Never Used  Substance Use Topics  . Alcohol use: No    Family history is   Family History  Problem  Relation Age of Onset  . Arthritis Mother   . Heart disease Mother   . Hypertension Mother   . Cancer Father   . Arthritis Father   . Heart disease Father   . Alzheimer's disease Sister   . Arthritis Sister   . Heart disease Sister   . Memory loss Sister   . Cancer Sister   . Heart attack Sister   . Cancer Brother   . Lung disease Brother   . Heart disease Brother       Review of Systems  DATA OBTAINED: from patient, nurse GENERAL:  no fevers, fatigue, appetite changes SKIN: No itching, or rash EYES: No eye pain, redness, discharge EARS: No earache, tinnitus, decrease in hearing, thinks is wax NOSE: No congestion, drainage or bleeding  MOUTH/THROAT: No mouth or tooth pain,  No sore throat RESPIRATORY: No cough, wheezing, SOB CARDIAC: No chest pain, palpitations, lower extremity edema  GI: No abdominal pain, No N/V/D or constipation, No heartburn or reflux  GU: No dysuria, frequency or urgency, or incontinence  MUSCULOSKELETAL: No unrelieved bone/joint pain: Difficulty with restless leg NEUROLOGIC: No headache, dizziness or focal weakness PSYCHIATRIC: No c/o anxiety or sadness   Vitals:   04/20/18 1443  BP: 130/78  Pulse: 80  Resp: 20  Temp: 98.6 F (37 C)    SpO2 Readings from Last 1 Encounters:  07/31/17 94%   Body mass index is 26.18 kg/m.     Physical Exam  GENERAL APPEARANCE: Alert, conversant,  No acute distress.,  Walking in the hall with her walker every day as per usual SKIN: No diaphoresis rash HEAD: Normocephalic, atraumatic  EYES: Conjunctiva/lids clear. Pupils round, reactive. EOMs intact.  EARS: External exam WNL, canals wax. Hearing grossly normal.  NOSE: No deformity or discharge.  MOUTH/THROAT: Lips w/o lesions  RESPIRATORY: Breathing is even, unlabored. Lung sounds are clear   CARDIOVASCULAR: Heart RRR no murmurs, rubs or gallops. No peripheral edema.   GASTROINTESTINAL: Abdomen is soft, non-tender, not distended w/ normal bowel  sounds. GENITOURINARY: Bladder non tender, not distended  MUSCULOSKELETAL: No abnormal joints or musculature NEUROLOGIC:  Cranial nerves 2-12 grossly intact. Moves all extremities  PSYCHIATRIC: Mood and affect appropriate to situation, no behavioral issues  Patient Active Problem List   Diagnosis Date Noted  . Hypotension 08/31/2016  . Postural dizziness with presyncope 08/31/2016  . Chest pain 08/31/2016  . Paroxysmal atrial fibrillation (HCC) 08/31/2016  . Hypokalemia 08/31/2016  . Depression 08/31/2016  . Vitamin D deficiency 08/31/2016  . Mood disorder (HCC) 08/22/2016  . PNA (pneumonia) 05/10/2016  . COPD exacerbation (HCC) 05/10/2016  . UTI (urinary tract infection) 05/10/2016  . Paroxysmal atrial fibrillation with RVR (HCC) 05/10/2016  . Metabolic encephalopathy 05/10/2016  . Elevated amylase and lipase 02/13/2016  . Acute encephalopathy 02/13/2016  . Psychosis (HCC) 02/13/2016  . Dementia with behavioral disturbance (HCC) 02/13/2016  . Overactive bladder 02/13/2016  . Chronic kidney disease   . COPD (chronic obstructive pulmonary disease) (HCC)   . Anemia   . Anxiety   . Barrett's esophagus   . Hypothyroidism   . GERD (gastroesophageal reflux disease)   . Hypertension   . Memory difficulties   . Osteoporosis   . Psoriasis (a type of skin inflammation)       Labs reviewed: Basic Metabolic Panel:    Component Value Date/Time   NA 143 01/27/2018   NA 143 03/18/2017   K 4.1 01/27/2018   K 3.8 03/18/2017   CL 100 (L) 01/26/2016 1935   CO2 30 01/26/2016 1935   GLUCOSE 115 (H) 01/26/2016 1935   BUN 12 01/27/2018   CREATININE 0.6 01/27/2018   CREATININE 0.64 03/18/2017   CALCIUM 9.2 03/18/2017   ALBUMIN 3.8 03/18/2017   AST 13 12/22/2017   AST 9 03/18/2017   ALT 8 12/22/2017   ALT 7 03/18/2017   ALKPHOS 88 12/22/2017   ALKPHOS 99 03/18/2017   BILITOT 0.2 03/18/2017   GFRNONAA 85.41 03/18/2017   GFRAA >60 01/26/2016 1935    Recent Labs    12/22/17  01/27/18  NA 142 143  K 3.9 4.1  BUN 15 12  CREATININE 0.6 0.6   Liver Function Tests: Recent Labs    12/22/17  AST 13  ALT 8  ALKPHOS 88   No results for input(s): LIPASE, AMYLASE in the last  8760 hours. No results for input(s): AMMONIA in the last 8760 hours. CBC: Recent Labs    12/22/17 01/27/18  WBC 5.4 6.3  HGB 11.0* 10.7*  HCT 31* 31*  PLT 236 408*   Lipid Recent Labs    12/22/17  CHOL 185  HDL 34*  LDLCALC 110  TRIG 203*    Cardiac Enzymes: No results for input(s): CKTOTAL, CKMB, CKMBINDEX, TROPONINI in the last 8760 hours. BNP: No results for input(s): BNP in the last 8760 hours. No results found for: [facility] Lab Results  Component Value Date   HGBA1C 5.4 12/22/2017   Lab Results  Component Value Date   TSH 3.88 12/22/2017   Lab Results  Component Value Date   VITAMINB12 1,188 05/24/2016   No results found for: FOLATE No results found for: IRON, TIBC, FERRITIN  Imaging and Procedures obtained prior to SNF admission: Ct Abdomen Pelvis W Contrast  Result Date: 01/26/2016 CLINICAL DATA:  Lower abdominal pain and low back pain EXAM: CT ABDOMEN AND PELVIS WITH CONTRAST TECHNIQUE: Multidetector CT imaging of the abdomen and pelvis was performed using the standard protocol following bolus administration of intravenous contrast. CONTRAST:  ISOVUE-300 IOPAMIDOL (ISOVUE-300) INJECTION 61% COMPARISON:  Chest radiograph 11/01/2007 FINDINGS: Lower chest: No pulmonary nodules. No visible pleural or pericardial effusion. Hepatobiliary: Normal hepatic size and contours without focal liver lesion. No perihepatic ascites. No intra- or extrahepatic biliary dilatation. Status post cholecystectomy. Pancreas: There is inflammatory stranding surrounding the head of the pancreas. No peripancreatic fluid collection. The pancreatic duct is not dilated. Spleen: Normal. Adrenals/Urinary Tract: Normal adrenal glands. No hydronephrosis or solid renal mass. Stomach/Bowel:  There is descending colonic and sigmoid diverticulosis without evidence of acute inflammation. No dilated small bowel. No abdominal fluid collection. Normal appendix. Vascular/Lymphatic: There is atherosclerotic calcification of the non aneurysmal abdominal aorta. No abdominal or pelvic adenopathy. Reproductive: Normal uterus and ovaries for age. Musculoskeletal: There is compression deformity of the T8 vertebral body, favored to be chronic. This appears unchanged relative to the radiograph of 11/01/2007 Normal visualized extrathoracic and extraperitoneal soft tissues. Other: No contributory non-categorized findings. IMPRESSION: 1. Acute pancreatitis without evidence of pancreatic necrosis or peripancreatic fluid collection. 2. Aortic atherosclerosis. 3. Descending colonic and sigmoid diverticulosis without acute diverticulitis. Electronically Signed   By: Deatra Robinson M.D.   On: 01/26/2016 21:57     Not all labs, radiology exams or other studies done during hospitalization come through on my EPIC note; however they are reviewed by me.    Assessment and Plan  Restless leg syndrome-increasing ropinirole 0.5 mg from nightly to twice daily  Cerumen ear canals- per protocol Debrox for 5 days then flush    Mayah Urquidi D. [name], MD

## 2018-04-21 ENCOUNTER — Encounter: Payer: Self-pay | Admitting: Internal Medicine

## 2018-04-24 ENCOUNTER — Other Ambulatory Visit: Payer: Self-pay

## 2018-04-27 ENCOUNTER — Other Ambulatory Visit: Payer: Self-pay | Admitting: Internal Medicine

## 2018-04-27 MED ORDER — HYDROCODONE-ACETAMINOPHEN 5-325 MG PO TABS: 1.0000 | ORAL_TABLET | Freq: Three times a day (TID) | ORAL | 0 refills | Status: DC

## 2018-04-27 MED ORDER — CLONAZEPAM 1 MG PO TABS: ORAL_TABLET | ORAL | 2 refills | Status: DC

## 2018-04-30 ENCOUNTER — Non-Acute Institutional Stay (SKILLED_NURSING_FACILITY): Payer: Medicare Other | Admitting: Internal Medicine

## 2018-04-30 DIAGNOSIS — L0201 Cutaneous abscess of face: Secondary | ICD-10-CM

## 2018-05-01 ENCOUNTER — Non-Acute Institutional Stay (SKILLED_NURSING_FACILITY): Payer: Medicare Other | Admitting: Internal Medicine

## 2018-05-01 ENCOUNTER — Encounter: Payer: Self-pay | Admitting: Internal Medicine

## 2018-05-01 DIAGNOSIS — L0201 Cutaneous abscess of face: Secondary | ICD-10-CM | POA: Diagnosis not present

## 2018-05-01 DIAGNOSIS — L989 Disorder of the skin and subcutaneous tissue, unspecified: Secondary | ICD-10-CM

## 2018-05-01 NOTE — Progress Notes (Signed)
:  Location:  [facility] Room Number: 213W Place of Service:  SNF ([id number])  Randon Goldsmith. [name], MD  Patient Care Team: [name], MD as PCP - General (Internal Medicine)  Extended Emergency Contact Information Primary Emergency Contact: [name] [phone] [name] Home Phone: [phone] Relation: Niece Secondary Emergency Contact: Dorice Lamas [place] Home Phone: [phone] Relation: Niece     Allergies: Azithromycin; Cephalexin; Cephalosporins; Ciprofloxacin; Darvon [propoxyphene]; Flagyl [metronidazole]; Librax [chlordiazepoxide-clidinium]; Macrodantin [nitrofurantoin macrocrystal]; Nitrofuran derivatives; Other; Oxycodone-acetaminophen; Percocet [oxycodone-acetaminophen]; Quetiapine; Sulfa antibiotics; and Sulfamethoxazole  Chief Complaint  Patient presents with   Acute Visit    HPI: Patient is 78 y.o. female who nursing asked me to see for swelling on patient's left shin.  On examination patient has 3 cm swelling that is firm tender with mild erythema.  Is not ripe; no fever chills nausea vomiting or any other systemic symptom.  Patient also has a swelling on the right side of her proximal nose that she is to see dermatology for that is becoming larger and it too is tender.  Past Medical History:  Diagnosis Date   Acute encephalopathy 02/13/2016   Anemia    Anxiety    Arthritis    Asthma    Barrett's esophagus    Chronic kidney disease    COPD (chronic obstructive pulmonary disease) (HCC)    Dementia (HCC)    Dementia with behavioral disturbance (HCC) 02/13/2016   Depression    Disease of thyroid gland    Diverticulitis    GERD (gastroesophageal reflux disease)    Hyperlipemia    Hypertension    Memory difficulties    Osteoporosis    Pancreatitis    Paroxysmal atrial fibrillation with RVR (HCC) 05/10/2016   Psoriasis (a type of skin inflammation)     Vertigo    Vitamin D deficiency 08/31/2016    Past Surgical History:  Procedure Laterality Date   BACK SURGERY     brain shun     CHOLECYSTECTOMY     ROTATOR CUFF REPAIR     SHOULDER SURGERY     UPPER GI ENDOSCOPY  12/19/2015   [facility], Include esophagus, stomach & duodenum or / Jejunum: Dx w/wo biopsys and dilatation; Surgeon : Leonia Reeves Rhoton MD    Allergies as of 04/30/2018      Reactions   Azithromycin Other (See Comments)   unknown   Cephalexin Other (See Comments)   unknown   Cephalosporins    Ciprofloxacin Other (See Comments)   unknown   Darvon [propoxyphene]    Flagyl [metronidazole] Other (See Comments)   unknown   Librax [chlordiazepoxide-clidinium] Other (See Comments)   unknown   Macrodantin [nitrofurantoin Macrocrystal]    Nitrofuran Derivatives Other (See Comments)   unknown   Other Other (See Comments)   unknown   Oxycodone-acetaminophen Other (See Comments)   unknown   Percocet [oxycodone-acetaminophen]    Quetiapine Other (See Comments)   unknown Potassium level dropped.   Sulfa Antibiotics    Sulfamethoxazole Other (See Comments)   unknown      Medication List       Accurate as of April 30, 2018 11:59 PM. Always use your most recent med list.        acetaminophen 325 MG tablet Commonly known as:  TYLENOL Take 650 mg by mouth every 8 (eight) hours as needed.   albuterol 108 (90 Base)  MCG/ACT inhaler Commonly known as:  PROVENTIL HFA;VENTOLIN HFA Inhale 2 puffs into the lungs every 6 (six) hours as needed for wheezing.   cetirizine 10 MG tablet Commonly known as:  ZYRTEC Take 10 mg by mouth at bedtime.   clonazePAM 1 MG tablet Commonly known as:  KLONOPIN Give 1 mg by mouth at bedtime   D3-1000 25 MCG (1000 UT) tablet Generic drug:  Cholecalciferol Take 1,000 Units by mouth daily.   donepezil 10 MG tablet Commonly known as:  ARICEPT Take 10 mg by mouth at bedtime.   doxycycline 100 MG tablet Commonly known as:   VIBRA-TABS Take 100 mg by mouth 2 (two) times daily.   fluticasone 50 MCG/ACT nasal spray Commonly known as:  FLONASE Place into both nostrils daily.   guaiFENesin 100 MG/5ML liquid Commonly known as:  ROBITUSSIN Take 200 mg by mouth. TAKE  = 20 ML PO BID FOR COUGH   HYDROcodone-acetaminophen 5-325 MG tablet Commonly known as:  NORCO/VICODIN Take 1 tablet by mouth every 8 (eight) hours.   ipratropium-albuterol 0.5-2.5 (3) MG/3ML Soln Commonly known as:  DUONEB Take 3 mLs by nebulization every 4 (four) hours as needed.   levothyroxine 100 MCG tablet Commonly known as:  SYNTHROID, LEVOTHROID Take 100 mcg by mouth daily before breakfast.   oxybutynin 5 MG tablet Commonly known as:  DITROPAN Take 5 mg by mouth 3 (three) times daily.   pantoprazole 40 MG tablet Commonly known as:  PROTONIX Take 40 mg by mouth 2 (two) times daily.   ranitidine 75 MG tablet Commonly known as:  ZANTAC Take 75 mg by mouth at bedtime.   rOPINIRole 0.25 MG tablet Commonly known as:  REQUIP Take 0.25 mg by mouth. Take 2 tablets (0.5 mg) orally twice a day for restless legs   sodium chloride 0.65 % Soln nasal spray Commonly known as:  OCEAN Place 2 sprays into both nostrils 2 (two) times daily as needed. dry nasal membrane   theophylline 100 MG 12 hr tablet Commonly known as:  THEODUR Take 100 mg by mouth 2 (two) times daily. COPD   venlafaxine 37.5 MG tablet Commonly known as:  EFFEXOR Take 37.5 mg by mouth daily.       No orders of the defined types were placed in this encounter.   Immunization History  Administered Date(s) Administered   Influenza, High Dose Seasonal PF 12/20/2014   Influenza, Seasonal, Injecte, Preservative Fre 10/10/2014   Influenza-Unspecified 12/07/2015, 11/21/2016   PPD Test 02/10/2016, 02/26/2016   Pneumococcal Conjugate-13 12/20/2014   Pneumococcal Polysaccharide-23 02/07/2017   Td 08/26/2014    Social History   Tobacco Use   Smoking  status: Former Smoker    Last attempt to quit: 12/17/1990    Years since quitting: 27.3   Smokeless tobacco: Never Used  Substance Use Topics   Alcohol use: No    Family history is   Family History  Problem Relation Age of Onset   Arthritis Mother    Heart disease Mother    Hypertension Mother    Cancer Father    Arthritis Father    Heart disease Father    Alzheimer's disease Sister    Arthritis Sister    Heart disease Sister    Memory loss Sister    Cancer Sister    Heart attack Sister    Cancer Brother    Lung disease Brother    Heart disease Brother       Review of Systems  DATA OBTAINED: from patient,  nurse GENERAL:  no fevers, fatigue, appetite changes SKIN: Swelling to chin as per history of present illness EYES: No eye pain, redness, discharge EARS: No earache, tinnitus, change in hearing NOSE: No congestion, drainage or bleeding  MOUTH/THROAT: No mouth or tooth pain, No sore throat RESPIRATORY: No cough, wheezing, SOB CARDIAC: No chest pain, palpitations, lower extremity edema  GI: No abdominal pain, No N/V/D or constipation, No heartburn or reflux  GU: No dysuria, frequency or urgency, or incontinence  MUSCULOSKELETAL: No unrelieved bone/joint pain NEUROLOGIC: No headache, dizziness or focal weakness PSYCHIATRIC: No c/o anxiety or sadness   Vitals:   05/01/18 1521  BP: 122/70  Pulse: 78  Resp: 18  Temp: 98.6 F (37 C)    SpO2 Readings from Last 1 Encounters:  07/31/17 94%   Body mass index is 26.18 kg/m.     Physical Exam  GENERAL APPEARANCE: Alert, conversant,  No acute distress.  SKIN: No diaphoresis rash HEAD: Normocephalic, atraumatic  EYES: Conjunctiva/lids clear. Pupils round, reactive. EOMs intact.  EARS: External exam WNL, canals clear. Hearing grossly normal.  NOSE: No deformity or discharge.  MOUTH/THROAT: Lips w/o lesions  RESPIRATORY: Breathing is even, unlabored. Lung sounds are clear   CARDIOVASCULAR:  Heart RRR no murmurs, rubs or gallops. No peripheral edema.   GASTROINTESTINAL: Abdomen is soft, non-tender, not distended w/ normal bowel sounds. GENITOURINARY: Bladder non tender, not distended  MUSCULOSKELETAL: No abnormal joints or musculature NEUROLOGIC:  Cranial nerves 2-12 grossly intact. Moves all extremities  PSYCHIATRIC: Mood and affect appropriate to situation, no behavioral issues  Patient Active Problem List   Diagnosis Date Noted   Hypotension 08/31/2016   Postural dizziness with presyncope 08/31/2016   Chest pain 08/31/2016   Paroxysmal atrial fibrillation (HCC) 08/31/2016   Hypokalemia 08/31/2016   Depression 08/31/2016   Vitamin D deficiency 08/31/2016   Mood disorder (HCC) 08/22/2016   PNA (pneumonia) 05/10/2016   COPD exacerbation (HCC) 05/10/2016   UTI (urinary tract infection) 05/10/2016   Paroxysmal atrial fibrillation with RVR (HCC) 05/10/2016   Metabolic encephalopathy 05/10/2016   Elevated amylase and lipase 02/13/2016   Acute encephalopathy 02/13/2016   Psychosis (HCC) 02/13/2016   Dementia with behavioral disturbance (HCC) 02/13/2016   Overactive bladder 02/13/2016   Chronic kidney disease    COPD (chronic obstructive pulmonary disease) (HCC)    Anemia    Anxiety    Barrett's esophagus    Hypothyroidism    GERD (gastroesophageal reflux disease)    Hypertension    Memory difficulties    Osteoporosis    Psoriasis (a type of skin inflammation)       Labs reviewed: Basic Metabolic Panel:    Component Value Date/Time   NA 143 01/27/2018   NA 143 03/18/2017   K 4.1 01/27/2018   K 3.8 03/18/2017   CL 100 (L) 01/26/2016 1935   CO2 30 01/26/2016 1935   GLUCOSE 115 (H) 01/26/2016 1935   BUN 12 01/27/2018   CREATININE 0.6 01/27/2018   CREATININE 0.64 03/18/2017   CALCIUM 9.2 03/18/2017   ALBUMIN 3.8 03/18/2017   AST 13 12/22/2017   AST 9 03/18/2017   ALT 8 12/22/2017   ALT 7 03/18/2017   ALKPHOS 88 12/22/2017     ALKPHOS 99 03/18/2017   BILITOT 0.2 03/18/2017   GFRNONAA 85.41 03/18/2017   GFRAA >60 01/26/2016 1935    Recent Labs    12/22/17 01/27/18  NA 142 143  K 3.9 4.1  BUN 15 12  CREATININE 0.6 0.6  Liver Function Tests: Recent Labs    12/22/17  AST 13  ALT 8  ALKPHOS 88   No results for input(s): LIPASE, AMYLASE in the last 8760 hours. No results for input(s): AMMONIA in the last 8760 hours. CBC: Recent Labs    12/22/17 01/27/18  WBC 5.4 6.3  HGB 11.0* 10.7*  HCT 31* 31*  PLT 236 408*   Lipid Recent Labs    12/22/17  CHOL 185  HDL 34*  LDLCALC 110  TRIG 203*    Cardiac Enzymes: No results for input(s): CKTOTAL, CKMB, CKMBINDEX, TROPONINI in the last 8760 hours. BNP: No results for input(s): BNP in the last 8760 hours. No results found for: [facility] Lab Results  Component Value Date   HGBA1C 5.4 12/22/2017   Lab Results  Component Value Date   TSH 3.88 12/22/2017   Lab Results  Component Value Date   VITAMINB12 1,188 05/24/2016   No results found for: FOLATE No results found for: IRON, TIBC, FERRITIN  Imaging and Procedures obtained prior to SNF admission: Ct Abdomen Pelvis W Contrast  Result Date: 01/26/2016 CLINICAL DATA:  Lower abdominal pain and low back pain EXAM: CT ABDOMEN AND PELVIS WITH CONTRAST TECHNIQUE: Multidetector CT imaging of the abdomen and pelvis was performed using the standard protocol following bolus administration of intravenous contrast. CONTRAST:  ISOVUE-300 IOPAMIDOL (ISOVUE-300) INJECTION 61% COMPARISON:  Chest radiograph 11/01/2007 FINDINGS: Lower chest: No pulmonary nodules. No visible pleural or pericardial effusion. Hepatobiliary: Normal hepatic size and contours without focal liver lesion. No perihepatic ascites. No intra- or extrahepatic biliary dilatation. Status post cholecystectomy. Pancreas: There is inflammatory stranding surrounding the head of the pancreas. No peripancreatic fluid collection. The pancreatic  duct is not dilated. Spleen: Normal. Adrenals/Urinary Tract: Normal adrenal glands. No hydronephrosis or solid renal mass. Stomach/Bowel: There is descending colonic and sigmoid diverticulosis without evidence of acute inflammation. No dilated small bowel. No abdominal fluid collection. Normal appendix. Vascular/Lymphatic: There is atherosclerotic calcification of the non aneurysmal abdominal aorta. No abdominal or pelvic adenopathy. Reproductive: Normal uterus and ovaries for age. Musculoskeletal: There is compression deformity of the T8 vertebral body, favored to be chronic. This appears unchanged relative to the radiograph of 11/01/2007 Normal visualized extrathoracic and extraperitoneal soft tissues. Other: No contributory non-categorized findings. IMPRESSION: 1. Acute pancreatitis without evidence of pancreatic necrosis or peripancreatic fluid collection. 2. Aortic atherosclerosis. 3. Descending colonic and sigmoid diverticulosis without acute diverticulitis. Electronically Signed   By: Deatra Robinson M.D.   On: 01/26/2016 21:57     Not all labs, radiology exams or other studies done during hospitalization come through on my EPIC note; however they are reviewed by me.    Assessment and Plan  Abscess left chin-is not ripe; will start doxycycline 100 mg twice daily for 10 days; will monitor response    Thurston Hole D. [name], MD

## 2018-05-06 ENCOUNTER — Encounter: Payer: Self-pay | Admitting: Internal Medicine

## 2018-05-07 ENCOUNTER — Encounter: Payer: Self-pay | Admitting: Internal Medicine

## 2018-05-09 NOTE — Progress Notes (Signed)
[facility]   Place of Service:   SNF  Margit HanksAlexander, Shawndell Varas D, MD  Patient Care Team: Margit HanksAlexander, Doreather Hoxworth D, MD as PCP - General (Internal Medicine)  Extended Emergency Contact Information Primary Emergency Contact: Docia BarrierHopkins,Tammy          [name] [phone] [place] Home Phone: [phone] Relation: Niece Secondary Emergency Contact: [name],Donna  [place] Home Phone: [phone] Relation: Niece    Allergies: Azithromycin; Cephalexin; Cephalosporins; Ciprofloxacin; Darvon [propoxyphene]; Flagyl [metronidazole]; Librax [chlordiazepoxide-clidinium]; Macrodantin [nitrofurantoin macrocrystal]; Nitrofuran derivatives; Other; Oxycodone-acetaminophen; Percocet [oxycodone-acetaminophen]; Quetiapine; Sulfa antibiotics; and Sulfamethoxazole  Chief Complaint  Patient presents with  . Acute Visit    HPI: Patient is 78 y.o. female who is being seen for follow-up of continued swelling left cheek chin and for a lesion on the bridge of her nose that is fluctuant and has been getting larger.  Patient will not be able to see the dermatologist for 3 more weeks.  Patient is being treated with doxycycline.  The lesion on her chin is now draining pus spontaneously.  There is no fever chills nausea vomiting or any other systemic symptom.  Past Medical History:  Diagnosis Date  . Acute encephalopathy 02/13/2016  . Anemia   . Anxiety   . Arthritis   . Asthma   . Barrett's esophagus   . Chronic kidney disease   . COPD (chronic obstructive pulmonary disease) (HCC)   . Dementia (HCC)   . Dementia with behavioral disturbance (HCC) 02/13/2016  . Depression   . Disease of thyroid gland   . Diverticulitis   . GERD (gastroesophageal reflux disease)   . Hyperlipemia   . Hypertension   . Memory difficulties   . Osteoporosis   . Pancreatitis   . Paroxysmal atrial fibrillation with RVR (HCC) 05/10/2016  . Psoriasis (a type of skin inflammation)   . Vertigo   . Vitamin D  deficiency 08/31/2016    Past Surgical History:  Procedure Laterality Date  . BACK SURGERY    . brain shun    . CHOLECYSTECTOMY    . ROTATOR CUFF REPAIR    . SHOULDER SURGERY    . UPPER GI ENDOSCOPY  12/19/2015   [facility], Include esophagus, stomach & duodenum or / Jejunum: Dx w/wo biopsys and dilatation; Surgeon : Leonia ReevesAlbert John Rhoton MD    Allergies as of 05/01/2018      Reactions   Azithromycin Other (See Comments)   unknown   Cephalexin Other (See Comments)   unknown   Cephalosporins    Ciprofloxacin Other (See Comments)   unknown   Darvon [propoxyphene]    Flagyl [metronidazole] Other (See Comments)   unknown   Librax [chlordiazepoxide-clidinium] Other (See Comments)   unknown   Macrodantin [nitrofurantoin Macrocrystal]    Nitrofuran Derivatives Other (See Comments)   unknown   Other Other (See Comments)   unknown   Oxycodone-acetaminophen Other (See Comments)   unknown   Percocet [oxycodone-acetaminophen]    Quetiapine Other (See Comments)   unknown Potassium level dropped.   Sulfa Antibiotics    Sulfamethoxazole Other (See Comments)   unknown      Medication List       Accurate as of May 01, 2018 11:59 PM. Always use your most recent med list.        acetaminophen 325 MG tablet Commonly known as:  TYLENOL Take 650 mg by mouth every 8 (eight) hours as needed.   albuterol 108 (90 Base) MCG/ACT inhaler Commonly known  as:  PROVENTIL HFA;VENTOLIN HFA Inhale 2 puffs into the lungs every 6 (six) hours as needed for wheezing.   cetirizine 10 MG tablet Commonly known as:  ZYRTEC Take 10 mg by mouth at bedtime.   clonazePAM 1 MG tablet Commonly known as:  KLONOPIN Give 1 mg by mouth at bedtime   D3-1000 25 MCG (1000 UT) tablet Generic drug:  Cholecalciferol Take 1,000 Units by mouth daily.   donepezil 10 MG tablet Commonly known as:  ARICEPT Take 10 mg by mouth at bedtime.   doxycycline 100 MG tablet Commonly known as:  VIBRA-TABS Take 100 mg by  mouth 2 (two) times daily.   fluticasone 50 MCG/ACT nasal spray Commonly known as:  FLONASE Place into both nostrils daily.   guaiFENesin 100 MG/5ML liquid Commonly known as:  ROBITUSSIN Take 200 mg by mouth. TAKE  = 20 ML PO BID FOR COUGH   HYDROcodone-acetaminophen 5-325 MG tablet Commonly known as:  NORCO/VICODIN Take 1 tablet by mouth every 8 (eight) hours.   ipratropium-albuterol 0.5-2.5 (3) MG/3ML Soln Commonly known as:  DUONEB Take 3 mLs by nebulization every 4 (four) hours as needed.   levothyroxine 100 MCG tablet Commonly known as:  SYNTHROID, LEVOTHROID Take 100 mcg by mouth daily before breakfast.   oxybutynin 5 MG tablet Commonly known as:  DITROPAN Take 5 mg by mouth 3 (three) times daily.   pantoprazole 40 MG tablet Commonly known as:  PROTONIX Take 40 mg by mouth 2 (two) times daily.   ranitidine 75 MG tablet Commonly known as:  ZANTAC Take 75 mg by mouth at bedtime.   rOPINIRole 0.25 MG tablet Commonly known as:  REQUIP Take 0.25 mg by mouth. Take 2 tablets (0.5 mg) orally twice a day for restless legs   sodium chloride 0.65 % Soln nasal spray Commonly known as:  OCEAN Place 2 sprays into both nostrils 2 (two) times daily as needed. dry nasal membrane   theophylline 100 MG 12 hr tablet Commonly known as:  THEODUR Take 100 mg by mouth 2 (two) times daily. COPD   venlafaxine 37.5 MG tablet Commonly known as:  EFFEXOR Take 37.5 mg by mouth daily.       No orders of the defined types were placed in this encounter.   Immunization History  Administered Date(s) Administered  . Influenza, High Dose Seasonal PF 12/20/2014  . Influenza, Seasonal, Injecte, Preservative Fre 10/10/2014  . Influenza-Unspecified 12/07/2015, 11/21/2016  . PPD Test 02/10/2016, 02/26/2016  . Pneumococcal Conjugate-13 12/20/2014  . Pneumococcal Polysaccharide-23 02/07/2017  . Td 08/26/2014    Social History   Tobacco Use  . Smoking status: Former Smoker     Last attempt to quit: 12/17/1990    Years since quitting: 27.4  . Smokeless tobacco: Never Used  Substance Use Topics  . Alcohol use: No    Review of Systems  DATA OBTAINED: from patient, nurse GENERAL:  no fevers, fatigue, appetite changes SKIN: As per history present illness HEENT: No complaint RESPIRATORY: No cough, wheezing, SOB CARDIAC: No chest pain, palpitations, lower extremity edema  GI: No abdominal pain, No N/V/D or constipation, No heartburn or reflux  GU: No dysuria, frequency or urgency, or incontinence  MUSCULOSKELETAL: No unrelieved bone/joint pain NEUROLOGIC: No headache, dizziness  PSYCHIATRIC: No overt anxiety or sadness  Vitals:   05/10/18 1351  BP: 122/70  Pulse: (!) 17  Resp: 18  Temp: 98.6 F (37 C)   Body mass index is 26.18 kg/m. Physical Exam  GENERAL APPEARANCE: Alert,  conversant, No acute distress  SKIN: Face- 3+ centimeter tender swelling left cheek jaw which is spontaneously draining pus: Right proximal bridge of nose with 5 mm swelling with central scab, very tender to touch, appears fluctuant HEENT: Unremarkable RESPIRATORY: Breathing is even, unlabored. Lung sounds are clear   CARDIOVASCULAR: Heart RRR no murmurs, rubs or gallops. No peripheral edema  GASTROINTESTINAL: Abdomen is soft, non-tender, not distended w/ normal bowel sounds.  GENITOURINARY: Bladder non tender, not distended  MUSCULOSKELETAL: No abnormal joints or musculature NEUROLOGIC: Cranial nerves 2-12 grossly intact. Moves all extremities PSYCHIATRIC: Mood and affect appropriate to situation, no behavioral issues  Patient Active Problem List   Diagnosis Date Noted  . Hypotension 08/31/2016  . Postural dizziness with presyncope 08/31/2016  . Chest pain 08/31/2016  . Paroxysmal atrial fibrillation (HCC) 08/31/2016  . Hypokalemia 08/31/2016  . Depression 08/31/2016  . Vitamin D deficiency 08/31/2016  . Mood disorder (HCC) 08/22/2016  . PNA (pneumonia) 05/10/2016  .  COPD exacerbation (HCC) 05/10/2016  . UTI (urinary tract infection) 05/10/2016  . Paroxysmal atrial fibrillation with RVR (HCC) 05/10/2016  . Metabolic encephalopathy 05/10/2016  . Elevated amylase and lipase 02/13/2016  . Acute encephalopathy 02/13/2016  . Psychosis (HCC) 02/13/2016  . Dementia with behavioral disturbance (HCC) 02/13/2016  . Overactive bladder 02/13/2016  . Chronic kidney disease   . COPD (chronic obstructive pulmonary disease) (HCC)   . Anemia   . Anxiety   . Barrett's esophagus   . Hypothyroidism   . GERD (gastroesophageal reflux disease)   . Hypertension   . Memory difficulties   . Osteoporosis   . Psoriasis (a type of skin inflammation)     CMP     Component Value Date/Time   NA 143 01/27/2018   NA 143 03/18/2017   K 4.1 01/27/2018   K 3.8 03/18/2017   CL 100 (L) 01/26/2016 1935   CO2 30 01/26/2016 1935   GLUCOSE 115 (H) 01/26/2016 1935   BUN 12 01/27/2018   CREATININE 0.6 01/27/2018   CREATININE 0.64 03/18/2017   CALCIUM 9.2 03/18/2017   ALBUMIN 3.8 03/18/2017   AST 13 12/22/2017   AST 9 03/18/2017   ALT 8 12/22/2017   ALT 7 03/18/2017   ALKPHOS 88 12/22/2017   ALKPHOS 99 03/18/2017   BILITOT 0.2 03/18/2017   GFRNONAA 85.41 03/18/2017   GFRAA >60 01/26/2016 1935   Recent Labs    12/22/17 01/27/18  NA 142 143  K 3.9 4.1  BUN 15 12  CREATININE 0.6 0.6   Recent Labs    12/22/17  AST 13  ALT 8  ALKPHOS 88   Recent Labs    12/22/17 01/27/18  WBC 5.4 6.3  HGB 11.0* 10.7*  HCT 31* 31*  PLT 236 408*   Recent Labs    12/22/17  CHOL 185  LDLCALC 110  TRIG 203*   No results found for: [facility] Lab Results  Component Value Date   TSH 3.88 12/22/2017   Lab Results  Component Value Date   HGBA1C 5.4 12/22/2017   Lab Results  Component Value Date   CHOL 185 12/22/2017   HDL 34 (A) 12/22/2017   LDLCALC 110 12/22/2017   TRIG 203 (A) 12/22/2017    Significant Diagnostic Results in last 30 days:  No results found.   Assessment and Plan  Left chin abscess-draining on its own; warm compresses and continue doxycycline  Lesion proximal right bridge of nose-appears ripe  Procedure I&D- under sterile prep area was open with small  neck of a 15 blade- return of thick blood; when sweep was made with sterile Q-tip the entire top of lesion became unroofed as if friable and disintegrated; the base of lesion was serrated and to multiple discrete entities like honeycombing; Neosporin and Band-Aid were applied; will monitor      Merrilee Seashore, MD

## 2018-05-10 ENCOUNTER — Encounter: Payer: Self-pay | Admitting: Internal Medicine

## 2018-05-15 ENCOUNTER — Non-Acute Institutional Stay (SKILLED_NURSING_FACILITY): Payer: Medicare Other | Admitting: Adult Health

## 2018-05-15 ENCOUNTER — Encounter: Payer: Self-pay | Admitting: Adult Health

## 2018-05-15 DIAGNOSIS — F0391 Unspecified dementia with behavioral disturbance: Secondary | ICD-10-CM

## 2018-05-15 DIAGNOSIS — E034 Atrophy of thyroid (acquired): Secondary | ICD-10-CM | POA: Diagnosis not present

## 2018-05-15 DIAGNOSIS — K2100 Gastro-esophageal reflux disease with esophagitis, without bleeding: Secondary | ICD-10-CM

## 2018-05-15 DIAGNOSIS — F0281 Dementia in other diseases classified elsewhere with behavioral disturbance: Secondary | ICD-10-CM

## 2018-05-15 DIAGNOSIS — J449 Chronic obstructive pulmonary disease, unspecified: Secondary | ICD-10-CM | POA: Diagnosis not present

## 2018-05-15 DIAGNOSIS — G8929 Other chronic pain: Secondary | ICD-10-CM

## 2018-05-15 DIAGNOSIS — N3281 Overactive bladder: Secondary | ICD-10-CM | POA: Diagnosis not present

## 2018-05-15 DIAGNOSIS — G301 Alzheimer's disease with late onset: Secondary | ICD-10-CM

## 2018-05-15 DIAGNOSIS — G2581 Restless legs syndrome: Secondary | ICD-10-CM

## 2018-05-15 DIAGNOSIS — F03918 Unspecified dementia, unspecified severity, with other behavioral disturbance: Secondary | ICD-10-CM

## 2018-05-15 DIAGNOSIS — R52 Pain, unspecified: Secondary | ICD-10-CM

## 2018-05-15 NOTE — Progress Notes (Signed)
Location:   [facility] Room Number: [id number] Place of Service:  SNF (31)   CODE STATUS: DNR  Allergies  Allergen Reactions  . Azithromycin Other (See Comments)    unknown  . Cephalexin Other (See Comments)    unknown  . Cephalosporins   . Ciprofloxacin Other (See Comments)    unknown  . Darvon [Propoxyphene]   . Flagyl [Metronidazole] Other (See Comments)    unknown  . Librax [Chlordiazepoxide-Clidinium] Other (See Comments)    unknown  . Macrodantin [Nitrofurantoin Macrocrystal]   . Nitrofuran Derivatives Other (See Comments)    unknown  . Other Other (See Comments)    unknown  . Oxycodone-Acetaminophen Other (See Comments)    unknown  . Percocet [Oxycodone-Acetaminophen]   . Quetiapine Other (See Comments)    unknown Potassium level dropped.  . Sulfa Antibiotics   . Sulfamethoxazole Other (See Comments)    unknown    Chief Complaint  Patient presents with  . Medical Management of Chronic Issues    Chronic obstructive pulmonary disease unspecified copd type; gastroesophageal reflux disease with esophagitis; hypothyroidism due to acquired atrophy of thyroid     HPI:  She is a 78 year old long term resident of this facility being seen for the management of her chronic illnesses: copd; gerd; hypothyroidism. Denies any heart burn; no chest pain; no cough no shortness of breath. No reports of fevers present   Past Medical History:  Diagnosis Date  . Acute encephalopathy 02/13/2016  . Anemia   . Anxiety   . Arthritis   . Asthma   . Barrett's esophagus   . Chronic kidney disease   . COPD (chronic obstructive pulmonary disease) (HCC)   . Dementia (HCC)   . Dementia with behavioral disturbance (HCC) 02/13/2016  . Depression   . Disease of thyroid gland   . Diverticulitis   . GERD (gastroesophageal reflux disease)   . Hyperlipemia   . Hypertension   . Memory difficulties   . Osteoporosis   . Pancreatitis   . Paroxysmal atrial  fibrillation with RVR (HCC) 05/10/2016  . Psoriasis (a type of skin inflammation)   . Vertigo   . Vitamin D deficiency 08/31/2016    Past Surgical History:  Procedure Laterality Date  . BACK SURGERY    . brain shun    . CHOLECYSTECTOMY    . ROTATOR CUFF REPAIR    . SHOULDER SURGERY    . UPPER GI ENDOSCOPY  12/19/2015   [facility] Endo, Include esophagus, stomach & duodenum or / Jejunum: Dx w/wo biopsys and dilatation; Surgeon : Stasia Cavalier MD    Social History   Socioeconomic History  . Marital status: Widowed    Spouse name: Not on file  . Number of children: Not on file  . Years of education: Not on file  . Highest education level: Not on file  Occupational History  . Not on file  Social Needs  . Financial resource strain: Not hard at all  . Food insecurity:    Worry: Never true    Inability: Never true  . Transportation needs:    Medical: No    Non-medical: No  Tobacco Use  . Smoking status: Former Smoker    Last attempt to quit: 12/17/1990    Years since quitting: 27.4  . Smokeless tobacco: Never Used  Substance and Sexual Activity  . Alcohol use: No  . Drug use: No  . Sexual activity: Never  Lifestyle  .  Physical activity:    Days per week: 0 days    Minutes per session: 0 min  . Stress: To some extent  Relationships  . Social connections:    Talks on phone: Never    Gets together: Never    Attends religious service: Never    Active member of club or organization: No    Attends meetings of clubs or organizations: Never    Relationship status: Widowed  . Intimate partner violence:    Fear of current or ex partner: No    Emotionally abused: No    Physically abused: No    Forced sexual activity: No  Other Topics Concern  . Not on file  Social History Narrative   Admitted to [facility] 02/10/2016   Widowed   Former smoker-stopped 1992   Alcohol none   DNR   Family History  Problem Relation Age of Onset  . Arthritis Mother   .  Heart disease Mother   . Hypertension Mother   . Cancer Father   . Arthritis Father   . Heart disease Father   . Alzheimer's disease Sister   . Arthritis Sister   . Heart disease Sister   . Memory loss Sister   . Cancer Sister   . Heart attack Sister   . Cancer Brother   . Lung disease Brother   . Heart disease Brother       VITAL SIGNS BP 112/70   Pulse 66   Temp 97.9 F (36.6 C) (Oral)   Resp 18   Ht 5\' 3"  (1.6 m)   Wt 147 lb 12.8 oz (67 kg)   LMP  (LMP Unknown)   BMI 26.18 kg/m   Outpatient Encounter Medications as of 05/15/2018  Medication Sig  . acetaminophen (TYLENOL) 325 MG tablet Take 650 mg by mouth every 8 (eight) hours as needed.   Marland Kitchen acetaminophen (TYLENOL) 500 MG tablet Take 500 mg by mouth 2 (two) times daily.  Marland Kitchen albuterol (PROVENTIL HFA;VENTOLIN HFA) 108 (90 Base) MCG/ACT inhaler Inhale 2 puffs into the lungs every 6 (six) hours as needed for wheezing.   . cetirizine (ZYRTEC) 10 MG tablet Take 10 mg by mouth at bedtime.  . Cholecalciferol (D3-1000) 1000 units tablet Take 1,000 Units by mouth daily.   . clonazePAM (KLONOPIN) 1 MG tablet Give 1 mg by mouth at bedtime  . donepezil (ARICEPT) 10 MG tablet Take 10 mg by mouth at bedtime.  . fluticasone (FLONASE) 50 MCG/ACT nasal spray Place 2 sprays into both nostrils daily as needed for allergies.   Marland Kitchen guaiFENesin (ROBITUSSIN) 100 MG/5ML liquid Take 200 mg by mouth. TAKE 400MG  = 20 ML PO BID FOR COUGH  . HYDROcodone-acetaminophen (NORCO/VICODIN) 5-325 MG tablet Take 1 tablet by mouth every 8 (eight) hours.  Marland Kitchen ipratropium-albuterol (DUONEB) 0.5-2.5 (3) MG/3ML SOLN Take 3 mLs by nebulization every 4 (four) hours as needed.   Marland Kitchen levothyroxine (SYNTHROID, LEVOTHROID) 100 MCG tablet Take 100 mcg by mouth daily before breakfast.  . oxybutynin (DITROPAN) 5 MG tablet Take 5 mg by mouth 3 (three) times daily.  . pantoprazole (PROTONIX) 40 MG tablet Take 40 mg by mouth 2 (two) times daily.   . ranitidine (ZANTAC) 75 MG tablet  Take 75 mg by mouth at bedtime.   Marland Kitchen rOPINIRole (REQUIP) 0.25 MG tablet Take 0.25 mg by mouth. Take 2 tablets (0.5 mg) orally twice a day for restless legs  . theophylline (THEODUR) 100 MG 12 hr tablet Take 100 mg  by mouth 2 (two) times daily. COPD  . venlafaxine (EFFEXOR) 37.5 MG tablet Take 37.5 mg by mouth daily.   . [DISCONTINUED] sodium chloride (OCEAN) 0.65 % SOLN nasal spray Place 2 sprays into both nostrils 2 (two) times daily as needed. dry nasal membrane   No facility-administered encounter medications on file as of 05/15/2018.      SIGNIFICANT DIAGNOSTIC EXAMS  LABS REVIEWED TODAY:   12-22-17: wbc 5.4; hgb 11.0; hct 31; plt 236; glucose 94; bun 15; creat 0.6; k+ 3.9; na++ 142 liver normal chol 185 ;ldl 110; trig 203 hdl 34 hgb a1c 5.4; tsh 3.88 01-27-18: wbc 6.3; hgb 10.7; hct 31 plt 408 glucose 103; bun 12; creat 0.6; k+ 4.1; na++ 143    Review of Systems  Constitutional: Negative for malaise/fatigue.  Respiratory: Negative for cough and shortness of breath.   Cardiovascular: Negative for chest pain, palpitations and leg swelling.  Gastrointestinal: Negative for abdominal pain, constipation and heartburn.  Musculoskeletal: Negative for back pain, joint pain and myalgias.  Skin: Negative.        Sore on nose and chin   Neurological: Negative for dizziness.  Psychiatric/Behavioral: The patient is not nervous/anxious.      Physical Exam Constitutional:      General: She is not in acute distress.    Appearance: She is well-developed. She is not diaphoretic.  Neck:     Thyroid: No thyromegaly.  Cardiovascular:     Rate and Rhythm: Normal rate and regular rhythm.     Heart sounds: Normal heart sounds.  Pulmonary:     Effort: Pulmonary effort is normal. No respiratory distress.     Breath sounds: Normal breath sounds.  Abdominal:     General: Bowel sounds are normal. There is no distension.     Palpations: Abdomen is soft.     Tenderness: There is no abdominal  tenderness.  Musculoskeletal:     Right lower leg: No edema.     Left lower leg: No edema.     Comments: Is able to move all extremities   Lymphadenopathy:     Cervical: No cervical adenopathy.  Skin:    General: Skin is warm and dry.     Comments: Left chin area is resolving with scant drainage present   Neurological:     Mental Status: She is alert. Mental status is at baseline.  Psychiatric:        Mood and Affect: Mood normal.         ASSESSMENT/ PLAN:  TODAY:   1. Paroxsymal atrial fibrillation: heart rate is stable  Will monitor  2. COPD( chronic obstructive pulmonary disease) is stable will continue zyrtec 10 mg daily albuterol 2 puffs every 6 hours as needed; duoneb every 4 hours as needed flonase daily as needed  theodur 100 mg twice daily robitussin 20 cc twice daily    3. GERD with esophagitis: is stable will continue protonix 40 mg twice daily and zanctac 75 mg daily   4. Hypothyroidism due acquired atrophy of thyroid: is stable tsh 3.88; will continue synthroid 100 mcg daily   5. Late onset alzheimer's disease with behavioral disturbance: is without change weight is 142 pounds; will continue aricept 10 mg daily  6. Over active bladder: is stable will continue ditpropan 5 mg three times daily   7. Psychosis in elderly with behavioral disturbance; is stable will continue klonopin 1 mg daily for anxiety   8. Restless leg syndrome: is stable will continue requip 0.5 mg  twice daily   9. Chronic generalized pain: is stable will continue tylenol 500 mg twice daily and vicodin 5/325 mg every 8 hours will monitor      MD is aware of resident's narcotic use and is in agreement with current plan of care. We will attempt to wean resident as apropriate   Synthia Innocent NP [facility]  Contact [phone] Monday through Friday 8am- 5pm  After hours call [phone]

## 2018-05-18 DIAGNOSIS — I10 Essential (primary) hypertension: Secondary | ICD-10-CM | POA: Diagnosis not present

## 2018-05-19 DIAGNOSIS — G2581 Restless legs syndrome: Secondary | ICD-10-CM | POA: Insufficient documentation

## 2018-05-19 DIAGNOSIS — G8929 Other chronic pain: Secondary | ICD-10-CM | POA: Insufficient documentation

## 2018-05-19 DIAGNOSIS — R52 Pain, unspecified: Secondary | ICD-10-CM

## 2018-05-27 ENCOUNTER — Other Ambulatory Visit: Payer: Self-pay | Admitting: Internal Medicine

## 2018-05-27 MED ORDER — HYDROCODONE-ACETAMINOPHEN 5-325 MG PO TABS: 1.0000 | ORAL_TABLET | Freq: Three times a day (TID) | ORAL | 0 refills | Status: DC

## 2018-06-15 ENCOUNTER — Non-Acute Institutional Stay (SKILLED_NURSING_FACILITY): Payer: Medicare Other | Admitting: Internal Medicine

## 2018-06-15 ENCOUNTER — Encounter: Payer: Self-pay | Admitting: Internal Medicine

## 2018-06-15 DIAGNOSIS — F39 Unspecified mood [affective] disorder: Secondary | ICD-10-CM | POA: Diagnosis not present

## 2018-06-15 DIAGNOSIS — E034 Atrophy of thyroid (acquired): Secondary | ICD-10-CM

## 2018-06-15 DIAGNOSIS — K22719 Barrett's esophagus with dysplasia, unspecified: Secondary | ICD-10-CM | POA: Diagnosis not present

## 2018-06-15 NOTE — Progress Notes (Signed)
[facility] Room Number[facility] [id number] Place of Service:  SNF [phone]  Randon Goldsmithnne D. Lyn HollingsheadAlexander, MD  Patient Care Team: Margit HanksAlexander, Saria Haran D, MD as PCP - General (Internal Medicine)  Extended Emergency Contact Information Primary Emergency Contact: Docia BarrierHopkins,Tammy          [name] [phone] [place] Home Phone: [phone] Relation: Niece Secondary Emergency Contact: [name],Donna  [place] Home Phone: [phone] Relation: Niece    Allergies: Azithromycin; Cephalexin; Cephalosporins; Ciprofloxacin; Darvon [propoxyphene]; Flagyl [metronidazole]; Librax [chlordiazepoxide-clidinium]; Macrodantin [nitrofurantoin macrocrystal]; Nitrofuran derivatives; Other; Oxycodone-acetaminophen; Percocet [oxycodone-acetaminophen]; Quetiapine; Sulfa antibiotics; and Sulfamethoxazole  Chief Complaint  Patient presents with  . Medical Management of Chronic Issues    Routine visit    HPI: Patient is 78 y.o. female who is being seen for routine issues of GERD, hypothyroidism and mood problems.  Past Medical History:  Diagnosis Date  . Acute encephalopathy 02/13/2016  . Anemia   . Anxiety   . Arthritis   . Asthma   . Barrett's esophagus   . Chronic kidney disease   . COPD (chronic obstructive pulmonary disease) (HCC)   . Dementia (HCC)   . Dementia with behavioral disturbance (HCC) 02/13/2016  . Depression   . Disease of thyroid gland   . Diverticulitis   . GERD (gastroesophageal reflux disease)   . Hyperlipemia   . Hypertension   . Memory difficulties   . Osteoporosis   . Pancreatitis   . Paroxysmal atrial fibrillation with RVR (HCC) 05/10/2016  . Psoriasis (a type of skin inflammation)   . Vertigo   . Vitamin D deficiency 08/31/2016    Past Surgical History:  Procedure Laterality Date  . BACK SURGERY    . brain shun    . CHOLECYSTECTOMY    . ROTATOR CUFF REPAIR    . SHOULDER SURGERY    . UPPER GI ENDOSCOPY  12/19/2015   [facility]  Endo, Include esophagus, stomach & duodenum or / Jejunum: Dx w/wo biopsys and dilatation; Surgeon : Leonia ReevesAlbert John Rhoton MD    Allergies as of 06/15/2018      Reactions   Azithromycin Other (See Comments)   unknown   Cephalexin Other (See Comments)   unknown   Cephalosporins    Ciprofloxacin Other (See Comments)   unknown   Darvon [propoxyphene]    Flagyl [metronidazole] Other (See Comments)   unknown   Librax [chlordiazepoxide-clidinium] Other (See Comments)   unknown   Macrodantin [nitrofurantoin Macrocrystal]    Nitrofuran Derivatives Other (See Comments)   unknown   Other Other (See Comments)   unknown   Oxycodone-acetaminophen Other (See Comments)   unknown   Percocet [oxycodone-acetaminophen]    Quetiapine Other (See Comments)   unknown Potassium level dropped.   Sulfa Antibiotics    Sulfamethoxazole Other (See Comments)   unknown      Medication List       Accurate as of Jun 15, 2018 11:59 PM. Always use your most recent med list.        acetaminophen 500 MG tablet Commonly known as:  TYLENOL Take 500 mg by mouth 2 (two) times daily.   acetaminophen 325 MG tablet Commonly known as:  TYLENOL Take 650 mg by mouth every 8 (eight) hours as needed.   albuterol 108 (90 Base) MCG/ACT inhaler Commonly known as:  VENTOLIN HFA Inhale 2 puffs into the lungs every 6 (six) hours as needed for wheezing.   cetirizine 10 MG tablet Commonly known as:  ZYRTEC Take 10 mg by mouth at bedtime.   clonazePAM 1 MG tablet Commonly known as:  KLONOPIN Give 1 mg by mouth at bedtime   D3-1000 25 MCG (1000 UT) tablet Generic drug:  Cholecalciferol Take 1,000 Units by mouth daily.   donepezil 10 MG tablet Commonly known as:  ARICEPT Take 10 mg by mouth at bedtime.   famotidine 20 MG tablet Commonly known as:  PEPCID Take 20 mg by mouth at bedtime.   fluticasone 50 MCG/ACT nasal spray Commonly known as:  FLONASE Place 2 sprays into both nostrils daily as needed for  allergies.   guaiFENesin 100 MG/5ML liquid Commonly known as:  ROBITUSSIN Take 200 mg by mouth. TAKE  = 20 ML PO BID FOR COUGH   HYDROcodone-acetaminophen 5-325 MG tablet Commonly known as:  NORCO/VICODIN Take 1 tablet by mouth every 8 (eight) hours.   ipratropium-albuterol 0.5-2.5 (3) MG/3ML Soln Commonly known as:  DUONEB Take 3 mLs by nebulization every 4 (four) hours as needed.   levothyroxine 100 MCG tablet Commonly known as:  SYNTHROID Take 100 mcg by mouth daily before breakfast.   oxybutynin 5 MG tablet Commonly known as:  DITROPAN Take 5 mg by mouth 3 (three) times daily.   pantoprazole 40 MG tablet Commonly known as:  PROTONIX Take 40 mg by mouth 2 (two) times daily.   rOPINIRole 0.25 MG tablet Commonly known as:  REQUIP Take 0.25 mg by mouth. Take 2 tablets (0.5 mg) orally twice a day for restless legs   theophylline 100 MG 12 hr tablet Commonly known as:  THEODUR Take 100 mg by mouth 2 (two) times daily. COPD   venlafaxine 37.5 MG tablet Commonly known as:  EFFEXOR Take 37.5 mg by mouth daily.       No orders of the defined types were placed in this encounter.   Immunization History  Administered Date(s) Administered  . Influenza, High Dose Seasonal PF 12/20/2014  . Influenza, Seasonal, Injecte, Preservative Fre 10/10/2014  . Influenza-Unspecified 12/07/2015, 11/21/2016  . PPD Test 02/10/2016, 02/26/2016  . Pneumococcal Conjugate-13 12/20/2014  . Pneumococcal Polysaccharide-23 02/07/2017  . Td 08/26/2014    Social History   Tobacco Use  . Smoking status: Former Smoker    Last attempt to quit: 12/17/1990    Years since quitting: 27.5  . Smokeless tobacco: Never Used  Substance Use Topics  . Alcohol use: No    Review of Systems  DATA OBTAINED: from patient GENERAL:  no fevers, fatigue, appetite changes SKIN: No itching, rash HEENT: No complaint RESPIRATORY: No cough, wheezing, SOB CARDIAC: No chest pain, palpitations, lower  extremity edema  GI: No abdominal pain, No N/V/D or constipation, No heartburn or reflux  GU: No dysuria, frequency or urgency, or incontinence  MUSCULOSKELETAL: No unrelieved bone/joint pain NEUROLOGIC: No headache, dizziness  PSYCHIATRIC: No overt anxiety or sadness  Vitals:   06/15/18 1144  BP: (!) 141/83  Pulse: 65  Resp: 20  Temp: 98.2 F (36.8 C)   Body mass index is 26.39 kg/m. Physical Exam  GENERAL APPEARANCE: Alert, conversant, No acute distress  SKIN: No diaphoresis rash HEENT: Unremarkable RESPIRATORY: Breathing is even, unlabored. Lung sounds are clear   CARDIOVASCULAR: Heart RRR no murmurs, rubs or gallops. No peripheral edema  GASTROINTESTINAL: Abdomen is soft, non-tender, not distended w/ normal bowel sounds.  GENITOURINARY: Bladder non tender, not distended  MUSCULOSKELETAL: No abnormal joints or musculature NEUROLOGIC: Cranial nerves 2-12 grossly intact. Moves all extremities PSYCHIATRIC: Mood and affect appropriate to situation, no  behavioral issues  Patient Active Problem List   Diagnosis Date Noted  . Restless leg syndrome 05/19/2018  . Chronic generalized pain 05/19/2018  . Hypotension 08/31/2016  . Postural dizziness with presyncope 08/31/2016  . Chest pain 08/31/2016  . Paroxysmal atrial fibrillation (HCC) 08/31/2016  . Hypokalemia 08/31/2016  . Depression 08/31/2016  . Vitamin D deficiency 08/31/2016  . Mood disorder (HCC) 08/22/2016  . PNA (pneumonia) 05/10/2016  . COPD exacerbation (HCC) 05/10/2016  . UTI (urinary tract infection) 05/10/2016  . Paroxysmal atrial fibrillation with RVR (HCC) 05/10/2016  . Metabolic encephalopathy 05/10/2016  . Elevated amylase and lipase 02/13/2016  . Acute encephalopathy 02/13/2016  . 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 Dementia with behavioral disturbance (HCC) 02/13/2016  . Overactive bladder 02/13/2016  . Chronic kidney disease   . COPD (chronic obstructive pulmonary disease) (HCC)   . Anemia    . Anxiety   . Barrett's esophagus   . Hypothyroidism   . GERD (gastroesophageal reflux disease)   . Hypertension   . Memory difficulties   . Osteoporosis   . Psoriasis (a type of skin inflammation)     CMP     Component Value Date/Time   NA 143 01/27/2018   NA 143 03/18/2017   K 4.1 01/27/2018   K 3.8 03/18/2017   CL 100 (L) 01/26/2016 1935   CO2 30 01/26/2016 1935   GLUCOSE 115 (H) 01/26/2016 1935   BUN 12 01/27/2018   CREATININE 0.6 01/27/2018   CREATININE 0.64 03/18/2017   CALCIUM 9.2 03/18/2017   ALBUMIN 3.8 03/18/2017   AST 13 12/22/2017   AST 9 03/18/2017   ALT 8 12/22/2017   ALT 7 03/18/2017   ALKPHOS 88 12/22/2017   ALKPHOS 99 03/18/2017   BILITOT 0.2 03/18/2017   GFRNONAA 85.41 03/18/2017   GFRAA >60 01/26/2016 1935   Recent Labs    12/22/17 01/27/18  NA 142 143  K 3.9 4.1  BUN 15 12  CREATININE 0.6 0.6   Recent Labs    12/22/17  AST 13  ALT 8  ALKPHOS 88   Recent Labs    12/22/17 01/27/18  WBC 5.4 6.3  HGB 11.0* 10.7*  HCT 31* 31*  PLT 236 408*   Recent Labs    12/22/17  CHOL 185  LDLCALC 110  TRIG 203*   No results found for: [facility] Lab Results  Component Value Date   TSH 3.88 12/22/2017   Lab Results  Component Value Date   HGBA1C 5.4 12/22/2017   Lab Results  Component Value Date   CHOL 185 12/22/2017   HDL 34 (A) 12/22/2017   LDLCALC 110 12/22/2017   TRIG 203 (A) 12/22/2017    Significant Diagnostic Results in last 30 days:  No results found.  Assessment and Plan  Barrett's esophagus GERD has been controlled; continue Zantac 75 mg nightly and Protonix 40 mg twice daily.  Hypothyroidism Good control; continue Synthroid 100 mcg daily  Mood disorder (HCC) No problems; continue Effexor 37.5 mg daily     Ankit Degregorio D. [name], MD

## 2018-06-16 ENCOUNTER — Encounter: Payer: Self-pay | Admitting: Internal Medicine

## 2018-06-16 NOTE — Assessment & Plan Note (Signed)
No problems; continue Effexor 37.5 mg daily

## 2018-06-16 NOTE — Assessment & Plan Note (Signed)
Good control; continue Synthroid 100 mcg daily

## 2018-06-16 NOTE — Assessment & Plan Note (Signed)
GERD has been controlled; continue Zantac 75 mg nightly and Protonix 40 mg twice daily.

## 2018-06-29 ENCOUNTER — Non-Acute Institutional Stay (SKILLED_NURSING_FACILITY): Payer: Medicare Other | Admitting: Internal Medicine

## 2018-06-29 ENCOUNTER — Other Ambulatory Visit: Payer: Self-pay | Admitting: Internal Medicine

## 2018-06-29 DIAGNOSIS — R059 Cough, unspecified: Secondary | ICD-10-CM

## 2018-06-29 DIAGNOSIS — R05 Cough: Secondary | ICD-10-CM

## 2018-06-29 MED ORDER — HYDROCODONE-ACETAMINOPHEN 5-325 MG PO TABS: 1.0000 | ORAL_TABLET | Freq: Three times a day (TID) | ORAL | 0 refills | Status: DC

## 2018-06-30 ENCOUNTER — Non-Acute Institutional Stay (SKILLED_NURSING_FACILITY): Payer: Medicare Other | Admitting: Internal Medicine

## 2018-06-30 DIAGNOSIS — J189 Pneumonia, unspecified organism: Secondary | ICD-10-CM | POA: Diagnosis not present

## 2018-06-30 DIAGNOSIS — R05 Cough: Secondary | ICD-10-CM | POA: Diagnosis not present

## 2018-06-30 NOTE — Progress Notes (Signed)
Location:  [facility] Room Number: 213-W Place of Service:  SNF ([id number])  [name], MD  Patient Care Team: [name], MD as PCP - General (Internal Medicine)  Extended Emergency Contact Information Primary Emergency Contact: [name] [phone] [name] Home Phone: [phone] Relation: Niece Secondary Emergency Contact: Dorice Lamas [place] Home Phone: [phone] Relation: Niece    Allergies: Azithromycin; Cephalexin; Cephalosporins; Ciprofloxacin; Darvon [propoxyphene]; Flagyl [metronidazole]; Librax [chlordiazepoxide-clidinium]; Macrodantin [nitrofurantoin macrocrystal]; Nitrofuran derivatives; Other; Oxycodone-acetaminophen; Percocet [oxycodone-acetaminophen]; Quetiapine; Sulfa antibiotics; and Sulfamethoxazole  Chief Complaint  Patient presents with  . Acute Visit    Patient has a cough.    HPI: Patient is  78 y.o. female who nursing asked me to see because patient has had a cough for several days.  Cough is dry, and intermittent.  There is no chest pain or shortness of breath, sore throat or fever..  Patient has had a dry cough in the past and when she was treated with daily Robitussin-DM she had no problem with it but her cough syrup was GDR.  Past Medical History:  Diagnosis Date  . Acute encephalopathy 02/13/2016  . Anemia   . Anxiety   . Arthritis   . Asthma   . Barrett's esophagus   . Chronic kidney disease   . COPD (chronic obstructive pulmonary disease) (HCC)   . Dementia (HCC)   . Dementia with behavioral disturbance (HCC) 02/13/2016  . Depression   . Disease of thyroid gland   . Diverticulitis   . GERD (gastroesophageal reflux disease)   . Hyperlipemia   . Hypertension   . Memory difficulties   . Osteoporosis   . Pancreatitis   . Paroxysmal atrial fibrillation with RVR (HCC) 05/10/2016  . Psoriasis (a type of skin inflammation)   . Vertigo   . Vitamin D  deficiency 08/31/2016    Past Surgical History:  Procedure Laterality Date  . BACK SURGERY    . brain shun    . CHOLECYSTECTOMY    . ROTATOR CUFF REPAIR    . SHOULDER SURGERY    . UPPER GI ENDOSCOPY  12/19/2015   [facility], Include esophagus, stomach & duodenum or / Jejunum: Dx w/wo biopsys and dilatation; Surgeon : Leonia Reeves Rhoton MD    Allergies as of 06/29/2018      Reactions   Azithromycin Other (See Comments)   unknown   Cephalexin Other (See Comments)   unknown   Cephalosporins    Ciprofloxacin Other (See Comments)   unknown   Darvon [propoxyphene]    Flagyl [metronidazole] Other (See Comments)   unknown   Librax [chlordiazepoxide-clidinium] Other (See Comments)   unknown   Macrodantin [nitrofurantoin Macrocrystal]    Nitrofuran Derivatives Other (See Comments)   unknown   Other Other (See Comments)   unknown   Oxycodone-acetaminophen Other (See Comments)   unknown   Percocet [oxycodone-acetaminophen]    Quetiapine Other (See Comments)   unknown Potassium level dropped.   Sulfa Antibiotics    Sulfamethoxazole Other (See Comments)   unknown      Medication List       Accurate as of Jun 29, 2018 11:59 PM. If you have any questions, ask your nurse or doctor.        STOP taking these medications   albuterol 108 (90 Base) MCG/ACT inhaler Commonly known as:  VENTOLIN HFA Stopped by:  Merrilee Seashore, MD  ipratropium-albuterol 0.5-2.5 (3) MG/3ML Soln Commonly known as:  DUONEB Stopped by:  Merrilee SeashoreAnne Aracelis Ulrey, MD     TAKE these medications   acetaminophen 500 MG tablet Commonly known as:  TYLENOL Take 500 mg by mouth 2 (two) times daily.   acetaminophen 325 MG tablet Commonly known as:  TYLENOL Take 650 mg by mouth every 8 (eight) hours as needed.   cetirizine 10 MG tablet Commonly known as:  ZYRTEC Take 10 mg by mouth at bedtime.   clonazePAM 1 MG tablet Commonly known as:  KLONOPIN Give 1 mg by mouth at bedtime   D3-1000 25 MCG (1000 UT)  tablet Generic drug:  Cholecalciferol Take 1,000 Units by mouth daily.   donepezil 10 MG tablet Commonly known as:  ARICEPT Take 10 mg by mouth at bedtime.   famotidine 20 MG tablet Commonly known as:  PEPCID Take 20 mg by mouth at bedtime.   fluticasone 50 MCG/ACT nasal spray Commonly known as:  FLONASE Place 2 sprays into both nostrils daily as needed for allergies.   guaiFENesin 100 MG/5ML liquid Commonly known as:  ROBITUSSIN Take 200-400 mg by mouth See admin instructions. 200 mg q4h prn 400 mg q6h prn 400 mg QID scheduled   HYDROcodone-acetaminophen 5-325 MG tablet Commonly known as:  NORCO/VICODIN Take 1 tablet by mouth every 8 (eight) hours.   levofloxacin 750 MG tablet Commonly known as:  LEVAQUIN Take 750 mg by mouth daily.   levothyroxine 100 MCG tablet Commonly known as:  SYNTHROID Take 100 mcg by mouth daily before breakfast.   oxybutynin 5 MG tablet Commonly known as:  DITROPAN Take 5 mg by mouth 3 (three) times daily.   pantoprazole 40 MG tablet Commonly known as:  PROTONIX Take 40 mg by mouth 2 (two) times daily.   rOPINIRole 0.25 MG tablet Commonly known as:  REQUIP Take 0.5 mg by mouth 2 (two) times a day. Take 2 tablets (0.5 mg)   theophylline 100 MG 12 hr tablet Commonly known as:  THEODUR Take 100 mg by mouth 2 (two) times daily. COPD   venlafaxine 37.5 MG tablet Commonly known as:  EFFEXOR Take 37.5 mg by mouth daily.       No orders of the defined types were placed in this encounter.   Immunization History  Administered Date(s) Administered  . Influenza, High Dose Seasonal PF 12/20/2014  . Influenza, Seasonal, Injecte, Preservative Fre 10/10/2014  . Influenza-Unspecified 12/07/2015, 11/21/2016  . PPD Test 02/10/2016, 02/26/2016  . Pneumococcal Conjugate-13 12/20/2014  . Pneumococcal Polysaccharide-23 02/07/2017  . Td 08/26/2014    Social History   Tobacco Use  . Smoking status: Former Smoker    Last attempt to quit:  12/17/1990    Years since quitting: 27.5  . Smokeless tobacco: Never Used  Substance Use Topics  . Alcohol use: No    Review of Systems  DATA OBTAINED: from patient, nurse GENERAL:  no fevers, fatigue, appetite changes SKIN: No itching, rash HEENT: No complaint RESPIRATORY: No cough, wheezing, SOB CARDIAC: No chest pain, palpitations, lower extremity edema  GI: No abdominal pain, No N/V/D or constipation, No heartburn or reflux  GU: No dysuria, frequency or urgency, or incontinence  MUSCULOSKELETAL: No unrelieved bone/joint pain NEUROLOGIC: No headache, dizziness  PSYCHIATRIC: No overt anxiety or sadness  Vitals:   07/01/18 1058  BP: 110/70  Pulse: 84  Resp: 20  Temp: 98.7 F (37.1 C)  SpO2: 96%   Body mass index is 26.61 kg/m. Physical Exam  GENERAL APPEARANCE: Alert,  conversant, No acute distress  SKIN: No diaphoresis rash HEENT: Unremarkable RESPIRATORY: Breathing is even, unlabored. Lung sounds are clear but decreased, no wheezing or rhonchi CARDIOVASCULAR: Heart RRR no murmurs, rubs or gallops. No peripheral edema  GASTROINTESTINAL: Abdomen is soft, non-tender, not distended w/ normal bowel sounds.  GENITOURINARY: Bladder non tender, not distended  MUSCULOSKELETAL: No abnormal joints or musculature NEUROLOGIC: Cranial nerves 2-12 grossly intact. Moves all extremities PSYCHIATRIC: Mood and affect appropriate to situation with some dementia, no behavioral issues  Patient Active Problem List   Diagnosis Date Noted  . Restless leg syndrome 05/19/2018  . Chronic generalized pain 05/19/2018  . Hypotension 08/31/2016  . Postural dizziness with presyncope 08/31/2016  . Chest pain 08/31/2016  . Paroxysmal atrial fibrillation (HCC) 08/31/2016  . Hypokalemia 08/31/2016  . Depression 08/31/2016  . Vitamin D deficiency 08/31/2016  . Mood disorder (HCC) 08/22/2016  . PNA (pneumonia) 05/10/2016  . COPD exacerbation (HCC) 05/10/2016  . UTI (urinary tract infection)  05/10/2016  . Paroxysmal atrial fibrillation with RVR (HCC) 05/10/2016  . Metabolic encephalopathy 05/10/2016  . Elevated amylase and lipase 02/13/2016  . Acute encephalopathy 02/13/2016  . 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 Dementia with behavioral disturbance (HCC) 02/13/2016  . Overactive bladder 02/13/2016  . Chronic kidney disease   . COPD (chronic obstructive pulmonary disease) (HCC)   . Anemia   . Anxiety   . Barrett's esophagus   . Hypothyroidism   . GERD (gastroesophageal reflux disease)   . Hypertension   . Memory difficulties   . Osteoporosis   . Psoriasis (a type of skin inflammation)     CMP     Component Value Date/Time   NA 143 01/27/2018   NA 143 03/18/2017   K 4.1 01/27/2018   K 3.8 03/18/2017   CL 100 (L) 01/26/2016 1935   CO2 30 01/26/2016 1935   GLUCOSE 115 (H) 01/26/2016 1935   BUN 12 01/27/2018   CREATININE 0.6 01/27/2018   CREATININE 0.64 03/18/2017   CALCIUM 9.2 03/18/2017   ALBUMIN 3.8 03/18/2017   AST 13 12/22/2017   AST 9 03/18/2017   ALT 8 12/22/2017   ALT 7 03/18/2017   ALKPHOS 88 12/22/2017   ALKPHOS 99 03/18/2017   BILITOT 0.2 03/18/2017   GFRNONAA 85.41 03/18/2017   GFRAA >60 01/26/2016 1935   Recent Labs    12/22/17 01/27/18  NA 142 143  K 3.9 4.1  BUN 15 12  CREATININE 0.6 0.6   Recent Labs    12/22/17  AST 13  ALT 8  ALKPHOS 88   Recent Labs    12/22/17 01/27/18  WBC 5.4 6.3  HGB 11.0* 10.7*  HCT 31* 31*  PLT 236 408*   Recent Labs    12/22/17  CHOL 185  LDLCALC 110  TRIG 203*   No results found for: [facility] Lab Results  Component Value Date   TSH 3.88 12/22/2017   Lab Results  Component Value Date   HGBA1C 5.4 12/22/2017   Lab Results  Component Value Date   CHOL 185 12/22/2017   HDL 34 (A) 12/22/2017   LDLCALC 110 12/22/2017   TRIG 203 (A) 12/22/2017    Significant Diagnostic Results in last 30 days:  No results found.  Assessmement and plan  Cough- will restart Robitussin-DM 2  teaspoons 3 times daily; have discussed this with patient and she is with that      Thurston Hole D. [name], MD

## 2018-07-01 ENCOUNTER — Encounter: Payer: Self-pay | Admitting: Internal Medicine

## 2018-07-02 ENCOUNTER — Non-Acute Institutional Stay (SKILLED_NURSING_FACILITY): Payer: Medicare Other | Admitting: Internal Medicine

## 2018-07-02 DIAGNOSIS — G301 Alzheimer's disease with late onset: Secondary | ICD-10-CM

## 2018-07-02 DIAGNOSIS — I480 Paroxysmal atrial fibrillation: Secondary | ICD-10-CM

## 2018-07-02 DIAGNOSIS — Z710 Person encountering health services to consult on behalf of another person: Secondary | ICD-10-CM

## 2018-07-02 DIAGNOSIS — F02818 Dementia in other diseases classified elsewhere, unspecified severity, with other behavioral disturbance: Secondary | ICD-10-CM

## 2018-07-02 DIAGNOSIS — J449 Chronic obstructive pulmonary disease, unspecified: Secondary | ICD-10-CM

## 2018-07-02 DIAGNOSIS — F0281 Dementia in other diseases classified elsewhere with behavioral disturbance: Secondary | ICD-10-CM

## 2018-07-04 ENCOUNTER — Encounter: Payer: Self-pay | Admitting: Internal Medicine

## 2018-07-04 NOTE — Progress Notes (Signed)
[facility]   Place of Service:   SNF  Margit HanksAlexander, Khristopher Kapaun D, MD  Patient Care Team: Margit HanksAlexander, Mairyn Lenahan D, MD as PCP - General (Internal Medicine)  Extended Emergency Contact Information Primary Emergency Contact: Docia BarrierHopkins,Tammy          [name] [phone] [place] Home Phone: [phone] Relation: Niece Secondary Emergency Contact: [name],Donna  [place] Home Phone: [phone] Relation: Niece    Allergies: Azithromycin; Cephalexin; Cephalosporins; Ciprofloxacin; Darvon [propoxyphene]; Flagyl [metronidazole]; Librax [chlordiazepoxide-clidinium]; Macrodantin [nitrofurantoin macrocrystal]; Nitrofuran derivatives; Other; Oxycodone-acetaminophen; Percocet [oxycodone-acetaminophen]; Quetiapine; Sulfa antibiotics; and Sulfamethoxazole  Chief Complaint  Patient presents with  . Acute Visit    HPI: Patient is 78 y.o. female who who is being seen today in follow-up after being seen for a dry cough yesterday.  Patient had chest x-ray done today which showed bilateral airspace consolidation slightly worse than prior of 10/2017, along with a list of differential diagnoses.  All this was a little vague the patient did have symptoms and patient does look sick today.  No reports of fever.  Past Medical History:  Diagnosis Date  . Acute encephalopathy 02/13/2016  . Anemia   . Anxiety   . Arthritis   . Asthma   . Barrett's esophagus   . Chronic kidney disease   . COPD (chronic obstructive pulmonary disease) (HCC)   . Dementia (HCC)   . Dementia with behavioral disturbance (HCC) 02/13/2016  . Depression   . Disease of thyroid gland   . Diverticulitis   . GERD (gastroesophageal reflux disease)   . Hyperlipemia   . Hypertension   . Memory difficulties   . Osteoporosis   . Pancreatitis   . Paroxysmal atrial fibrillation with RVR (HCC) 05/10/2016  . Psoriasis (a type of skin inflammation)   . Vertigo   . Vitamin D deficiency 08/31/2016    Past Surgical  History:  Procedure Laterality Date  . BACK SURGERY    . brain shun    . CHOLECYSTECTOMY    . ROTATOR CUFF REPAIR    . SHOULDER SURGERY    . UPPER GI ENDOSCOPY  12/19/2015   [facility], Include esophagus, stomach & duodenum or / Jejunum: Dx w/wo biopsys and dilatation; Surgeon : Leonia ReevesAlbert John Rhoton MD    Allergies as of 06/30/2018      Reactions   Azithromycin Other (See Comments)   unknown   Cephalexin Other (See Comments)   unknown   Cephalosporins    Ciprofloxacin Other (See Comments)   unknown   Darvon [propoxyphene]    Flagyl [metronidazole] Other (See Comments)   unknown   Librax [chlordiazepoxide-clidinium] Other (See Comments)   unknown   Macrodantin [nitrofurantoin Macrocrystal]    Nitrofuran Derivatives Other (See Comments)   unknown   Other Other (See Comments)   unknown   Oxycodone-acetaminophen Other (See Comments)   unknown   Percocet [oxycodone-acetaminophen]    Quetiapine Other (See Comments)   unknown Potassium level dropped.   Sulfa Antibiotics    Sulfamethoxazole Other (See Comments)   unknown      Medication List       Accurate as of Jun 30, 2018 11:59 PM. If you have any questions, ask your nurse or doctor.        acetaminophen 500 MG tablet Commonly known as:  TYLENOL Take 500 mg by mouth 2 (two) times daily.   acetaminophen 325 MG tablet Commonly known as:  TYLENOL Take 650 mg by mouth every 8 (eight) hours  as needed.   cetirizine 10 MG tablet Commonly known as:  ZYRTEC Take 10 mg by mouth at bedtime.   clonazePAM 1 MG tablet Commonly known as:  KLONOPIN Give 1 mg by mouth at bedtime   D3-1000 25 MCG (1000 UT) tablet Generic drug:  Cholecalciferol Take 1,000 Units by mouth daily.   donepezil 10 MG tablet Commonly known as:  ARICEPT Take 10 mg by mouth at bedtime.   famotidine 20 MG tablet Commonly known as:  PEPCID Take 20 mg by mouth at bedtime.   fluticasone 50 MCG/ACT nasal spray Commonly known as:  FLONASE Place 2  sprays into both nostrils daily as needed for allergies.   guaiFENesin 100 MG/5ML liquid Commonly known as:  ROBITUSSIN Take 200-400 mg by mouth See admin instructions. 200 mg q4h prn 400 mg q6h prn 400 mg QID scheduled   HYDROcodone-acetaminophen 5-325 MG tablet Commonly known as:  NORCO/VICODIN Take 1 tablet by mouth every 8 (eight) hours.   levofloxacin 750 MG tablet Commonly known as:  LEVAQUIN Take 750 mg by mouth daily.   levothyroxine 100 MCG tablet Commonly known as:  SYNTHROID Take 100 mcg by mouth daily before breakfast.   oxybutynin 5 MG tablet Commonly known as:  DITROPAN Take 5 mg by mouth 3 (three) times daily.   pantoprazole 40 MG tablet Commonly known as:  PROTONIX Take 40 mg by mouth 2 (two) times daily.   rOPINIRole 0.25 MG tablet Commonly known as:  REQUIP Take 0.5 mg by mouth 2 (two) times a day. Take 2 tablets (0.5 mg)   theophylline 100 MG 12 hr tablet Commonly known as:  THEODUR Take 100 mg by mouth 2 (two) times daily. COPD   venlafaxine 37.5 MG tablet Commonly known as:  EFFEXOR Take 37.5 mg by mouth daily.       No orders of the defined types were placed in this encounter.   Immunization History  Administered Date(s) Administered  . Influenza, High Dose Seasonal PF 12/20/2014  . Influenza, Seasonal, Injecte, Preservative Fre 10/10/2014  . Influenza-Unspecified 12/07/2015, 11/21/2016  . PPD Test 02/10/2016, 02/26/2016  . Pneumococcal Conjugate-13 12/20/2014  . Pneumococcal Polysaccharide-23 02/07/2017  . Td 08/26/2014    Social History   Tobacco Use  . Smoking status: Former Smoker    Last attempt to quit: 12/17/1990    Years since quitting: 27.5  . Smokeless tobacco: Never Used  Substance Use Topics  . Alcohol use: No    Review of Systems  DATA OBTAINED: from patient, nurse GENERAL:  no fevers, fatigue, appetite changes SKIN: No itching, rash HEENT: No complaint RESPIRATORY: + cough, no wheezing, no SOB CARDIAC: No  chest pain, palpitations, lower extremity edema  GI: No abdominal pain, No N/V/D or constipation, No heartburn or reflux  GU: No dysuria, frequency or urgency, or incontinence  MUSCULOSKELETAL: No unrelieved bone/joint pain NEUROLOGIC: No headache, dizziness  PSYCHIATRIC: No overt anxiety or sadness  Vitals:   07/04/18 1223  BP: 124/68  Pulse: 68  Resp: 18  Temp: 98.4 F (36.9 C)   Body mass index is 26.61 kg/m. Physical Exam  GENERAL APPEARANCE: Alert, conversant, No acute distress, patient does look sick, not toxic SKIN: No diaphoresis rash HEENT: Unremarkable RESPIRATORY: Breathing is even, unlabored. Lung sounds are no rails, wheezes, or rhonchi CARDIOVASCULAR: Heart RRR no murmurs, rubs or gallops. No peripheral edema  GASTROINTESTINAL: Abdomen is soft, non-tender, not distended w/ normal bowel sounds.  GENITOURINARY: Bladder non tender, not distended  MUSCULOSKELETAL: No abnormal joints  or musculature NEUROLOGIC: Cranial nerves 2-12 grossly intact. Moves all extremities PSYCHIATRIC: Mood and affect appropriate to situation, no behavioral issues  Patient Active Problem List   Diagnosis Date Noted  . Restless leg syndrome 05/19/2018  . Chronic generalized pain 05/19/2018  . Hypotension 08/31/2016  . Postural dizziness with presyncope 08/31/2016  . Chest pain 08/31/2016  . Paroxysmal atrial fibrillation (HCC) 08/31/2016  . Hypokalemia 08/31/2016  . Depression 08/31/2016  . Vitamin D deficiency 08/31/2016  . Mood disorder (HCC) 08/22/2016  . PNA (pneumonia) 05/10/2016  . COPD exacerbation (HCC) 05/10/2016  . UTI (urinary tract infection) 05/10/2016  . Paroxysmal atrial fibrillation with RVR (HCC) 05/10/2016  . Metabolic encephalopathy 05/10/2016  . Elevated amylase and lipase 02/13/2016  . Acute encephalopathy 02/13/2016  . 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 Dementia with behavioral disturbance (HCC) 02/13/2016  . Overactive bladder 02/13/2016  . Chronic  kidney disease   . COPD (chronic obstructive pulmonary disease) (HCC)   . Anemia   . Anxiety   . Barrett's esophagus   . Hypothyroidism   . GERD (gastroesophageal reflux disease)   . Hypertension   . Memory difficulties   . Osteoporosis   . Psoriasis (a type of skin inflammation)     CMP     Component Value Date/Time   NA 143 01/27/2018   NA 143 03/18/2017   K 4.1 01/27/2018   K 3.8 03/18/2017   CL 100 (L) 01/26/2016 1935   CO2 30 01/26/2016 1935   GLUCOSE 115 (H) 01/26/2016 1935   BUN 12 01/27/2018   CREATININE 0.6 01/27/2018   CREATININE 0.64 03/18/2017   CALCIUM 9.2 03/18/2017   ALBUMIN 3.8 03/18/2017   AST 13 12/22/2017   AST 9 03/18/2017   ALT 8 12/22/2017   ALT 7 03/18/2017   ALKPHOS 88 12/22/2017   ALKPHOS 99 03/18/2017   BILITOT 0.2 03/18/2017   GFRNONAA 85.41 03/18/2017   GFRAA >60 01/26/2016 1935   Recent Labs    12/22/17 01/27/18  NA 142 143  K 3.9 4.1  BUN 15 12  CREATININE 0.6 0.6   Recent Labs    12/22/17  AST 13  ALT 8  ALKPHOS 88   Recent Labs    12/22/17 01/27/18  WBC 5.4 6.3  HGB 11.0* 10.7*  HCT 31* 31*  PLT 236 408*   Recent Labs    12/22/17  CHOL 185  LDLCALC 110  TRIG 203*   No results found for: [facility] Lab Results  Component Value Date   TSH 3.88 12/22/2017   Lab Results  Component Value Date   HGBA1C 5.4 12/22/2017   Lab Results  Component Value Date   CHOL 185 12/22/2017   HDL 34 (A) 12/22/2017   LDLCALC 110 12/22/2017   TRIG 203 (A) 12/22/2017    Significant Diagnostic Results in last 30 days:  No results found.  Assessment and Plan  HCAP-x-ray with bilateral airspace opacities minimally worse than 10/2017; because patient has had a cough and because patient appeared ill this will be treated as pneumonia; patient has allergies so the antibiotic of choice Zithromax cannot be used, will use Levaquin 750 mg daily for 5 days.  Started duo nebs 3 times daily for 7 days and patient is already on Robitussin-DM  scheduled; will monitor response.    Merrilee Seashore, MD

## 2018-07-04 NOTE — Progress Notes (Signed)
Location:   [facility] Service:   SNF  [name], MD  Patient Care Team: [name], MD as PCP - General (Internal Medicine)  Extended Emergency Contact Information Primary Emergency Contact: [name] [phone] [name] Home Phone: [phone] Relation: Niece Secondary Emergency Contact: Dorice Lam[place] Home Phone: [phone] Relation: Niece    Allergies: Azithromycin; Cephalexin; Cephalosporins; Ciprofloxacin; Darvon [propoxyphene]; Flagyl [metronidazole]; Librax [chlordiazepoxide-clidinium]; Macrodantin [nitrofurantoin macrocrystal]; Nitrofuran derivatives; Other; Oxycodone-acetaminophen; Percocet [oxycodone-acetaminophen]; Quetiapine; Sulfa antibiotics; and Sulfamethoxazole  Chief Complaint  Patient presents with  . Acute Visit    HPI: Patient is 78 y.o. female who is being seen for a care plan meeting.  In attendance are Clifton Custard, social work and myself.  This is a telephone conference due to COVID restrictions.  Patient has said that she does not wish to attend meeting.  Past Medical History:  Diagnosis Date  . Acute encephalopathy 02/13/2016  . Anemia   . Anxiety   . Arthritis   . Asthma   . Barrett's esophagus   . Chronic kidney disease   . COPD (chronic obstructive pulmonary disease) (HCC)   . Dementia (HCC)   . Dementia with behavioral disturbance (HCC) 02/13/2016  . Depression   . Disease of thyroid gland   . Diverticulitis   . GERD (gastroesophageal reflux disease)   . Hyperlipemia   . Hypertension   . Memory difficulties   . Osteoporosis   . Pancreatitis   . Paroxysmal atrial fibrillation with RVR (HCC) 05/10/2016  . Psoriasis (a type of skin inflammation)   . Vertigo   . Vitamin D deficiency 08/31/2016    Past Surgical History:  Procedure Laterality Date  . BACK SURGERY    . brain shun    . CHOLECYSTECTOMY    . ROTATOR CUFF REPAIR    . SHOULDER SURGERY    .  UPPER GI ENDOSCOPY  12/19/2015   [facility], Include esophagus, stomach & duodenum or / Jejunum: Dx w/wo biopsys and dilatation; Surgeon : Leonia Reeves Rhoton MD    Allergies as of 07/02/2018      Reactions   Azithromycin Other (See Comments)   unknown   Cephalexin Other (See Comments)   unknown   Cephalosporins    Ciprofloxacin Other (See Comments)   unknown   Darvon [propoxyphene]    Flagyl [metronidazole] Other (See Comments)   unknown   Librax [chlordiazepoxide-clidinium] Other (See Comments)   unknown   Macrodantin [nitrofurantoin Macrocrystal]    Nitrofuran Derivatives Other (See Comments)   unknown   Other Other (See Comments)   unknown   Oxycodone-acetaminophen Other (See Comments)   unknown   Percocet [oxycodone-acetaminophen]    Quetiapine Other (See Comments)   unknown Potassium level dropped.   Sulfa Antibiotics    Sulfamethoxazole Other (See Comments)   unknown      Medication List       Accurate as of Jul 02, 2018 11:59 PM. If you have any questions, ask your nurse or doctor.        acetaminophen 500 MG tablet Commonly known as:  TYLENOL Take 500 mg by mouth 2 (two) times daily.   acetaminophen 325 MG tablet Commonly known as:  TYLENOL Take 650 mg by mouth every 8 (eight) hours as needed.   cetirizine 10 MG tablet Commonly known as:  ZYRTEC Take 10 mg by mouth at bedtime.   clonazePAM 1  MG tablet Commonly known as:  KLONOPIN Give 1 mg by mouth at bedtime   D3-1000 25 MCG (1000 UT) tablet Generic drug:  Cholecalciferol Take 1,000 Units by mouth daily.   donepezil 10 MG tablet Commonly known as:  ARICEPT Take 10 mg by mouth at bedtime.   famotidine 20 MG tablet Commonly known as:  PEPCID Take 20 mg by mouth at bedtime.   fluticasone 50 MCG/ACT nasal spray Commonly known as:  FLONASE Place 2 sprays into both nostrils daily as needed for allergies.   guaiFENesin 100 MG/5ML liquid Commonly known as:  ROBITUSSIN Take 200-400 mg by mouth  See admin instructions. 200 mg q4h prn 400 mg q6h prn 400 mg QID scheduled   HYDROcodone-acetaminophen 5-325 MG tablet Commonly known as:  NORCO/VICODIN Take 1 tablet by mouth every 8 (eight) hours.   levofloxacin 750 MG tablet Commonly known as:  LEVAQUIN Take 750 mg by mouth daily.   levothyroxine 100 MCG tablet Commonly known as:  SYNTHROID Take 100 mcg by mouth daily before breakfast.   oxybutynin 5 MG tablet Commonly known as:  DITROPAN Take 5 mg by mouth 3 (three) times daily.   pantoprazole 40 MG tablet Commonly known as:  PROTONIX Take 40 mg by mouth 2 (two) times daily.   rOPINIRole 0.25 MG tablet Commonly known as:  REQUIP Take 0.5 mg by mouth 2 (two) times a day. Take 2 tablets (0.5 mg)   theophylline 100 MG 12 hr tablet Commonly known as:  THEODUR Take 100 mg by mouth 2 (two) times daily. COPD   venlafaxine 37.5 MG tablet Commonly known as:  EFFEXOR Take 37.5 mg by mouth daily.       No orders of the defined types were placed in this encounter.   Immunization History  Administered Date(s) Administered  . Influenza, High Dose Seasonal PF 12/20/2014  . Influenza, Seasonal, Injecte, Preservative Fre 10/10/2014  . Influenza-Unspecified 12/07/2015, 11/21/2016  . PPD Test 02/10/2016, 02/26/2016  . Pneumococcal Conjugate-13 12/20/2014  . Pneumococcal Polysaccharide-23 02/07/2017  . Td 08/26/2014    Social History   Tobacco Use  . Smoking status: Former Smoker    Last attempt to quit: 12/17/1990    Years since quitting: 27.5  . Smokeless tobacco: Never Used  Substance Use Topics  . Alcohol use: No    Review of Systems  DATA OBTAINED: from nurse GENERAL:  no fevers, fatigue, appetite changes-appears to feel better  SKIN: No itching, rash HEENT: No complaint RESPIRATORY: No cough, wheezing, SOB CARDIAC: No chest pain, palpitations, lower extremity edema  GI: No abdominal pain, No N/V/D or constipation, No heartburn or reflux  GU: No dysuria,  frequency or urgency, or incontinence  MUSCULOSKELETAL: No unrelieved bone/joint pain NEUROLOGIC: No headache, dizziness  PSYCHIATRIC: No overt anxiety or sadness  Vitals:   07/04/18 1651  BP: (!) 96/56  Pulse: 88  Resp: 20  Temp: (!) 97.2 F (36.2 C)   There is no height or weight on file to calculate BMI. Physical Exam  GENERAL APPEARANCE: Alert, conversant, No acute distress  SKIN: No diaphoresis rash HEENT: Unremarkable RESPIRATORY: Breathing is even, unlabored. Lung sounds are no rales rhonchi or wheezes CARDIOVASCULAR: Heart RRR no murmurs, rubs or gallops. No peripheral edema  GASTROINTESTINAL: Abdomen is soft, non-tender, not distended w/ normal bowel sounds.  GENITOURINARY: Bladder non tender, not distended  MUSCULOSKELETAL: No abnormal joints or musculature NEUROLOGIC: Cranial nerves 2-12 grossly intact. Moves all extremities PSYCHIATRIC: Mood and affect appropriate to situation, no behavioral issues  Patient Active Problem List   Diagnosis Date Noted  . Restless leg syndrome 05/19/2018  . Chronic generalized pain 05/19/2018  . Hypotension 08/31/2016  . Postural dizziness with presyncope 08/31/2016  . Chest pain 08/31/2016  . Paroxysmal atrial fibrillation (HCC) 08/31/2016  . Hypokalemia 08/31/2016  . Depression 08/31/2016  . Vitamin D deficiency 08/31/2016  . Mood disorder (HCC) 08/22/2016  . PNA (pneumonia) 05/10/2016  . COPD exacerbation (HCC) 05/10/2016  . UTI (urinary tract infection) 05/10/2016  . Paroxysmal atrial fibrillation with RVR (HCC) 05/10/2016  . Metabolic encephalopathy 05/10/2016  . Elevated amylase and lipase 02/13/2016  . Acute encephalopathy 02/13/2016  . 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 Dementia with behavioral disturbance (HCC) 02/13/2016  . Overactive bladder 02/13/2016  . Chronic kidney disease   . COPD (chronic obstructive pulmonary disease) (HCC)   . Anemia   . Anxiety   . Barrett's esophagus   . Hypothyroidism   .  GERD (gastroesophageal reflux disease)   . Hypertension   . Memory difficulties   . Osteoporosis   . Psoriasis (a type of skin inflammation)     CMP     Component Value Date/Time   NA 143 01/27/2018   NA 143 03/18/2017   K 4.1 01/27/2018   K 3.8 03/18/2017   CL 100 (L) 01/26/2016 1935   CO2 30 01/26/2016 1935   GLUCOSE 115 (H) 01/26/2016 1935   BUN 12 01/27/2018   CREATININE 0.6 01/27/2018   CREATININE 0.64 03/18/2017   CALCIUM 9.2 03/18/2017   ALBUMIN 3.8 03/18/2017   AST 13 12/22/2017   AST 9 03/18/2017   ALT 8 12/22/2017   ALT 7 03/18/2017   ALKPHOS 88 12/22/2017   ALKPHOS 99 03/18/2017   BILITOT 0.2 03/18/2017   GFRNONAA 85.41 03/18/2017   GFRAA >60 01/26/2016 1935   Recent Labs    12/22/17 01/27/18  NA 142 143  K 3.9 4.1  BUN 15 12  CREATININE 0.6 0.6   Recent Labs    12/22/17  AST 13  ALT 8  ALKPHOS 88   Recent Labs    12/22/17 01/27/18  WBC 5.4 6.3  HGB 11.0* 10.7*  HCT 31* 31*  PLT 236 408*   Recent Labs    12/22/17  CHOL 185  LDLCALC 110  TRIG 203*   [facility] Results  Component Value Date   TSH 3.88 12/22/2017   Lab Results  Component Value Date   HGBA1C 5.4 12/22/2017   Lab Results  Component Value Date   CHOL 185 12/22/2017   HDL 34 (A) 12/22/2017   LDLCALC 110 12/22/2017   TRIG 203 (A) 12/22/2017    Significant Diagnostic Results in last 30 days:  No results found.  Assessment and Plan  Dementia/COPD/PAF/encounter for family conference- usual care plan meeting took place, and then I explained to family member what exactly the facility is doing to keep COVID out of the building in great detail.  At this point we discussed what patient would want in the unlikely event that she herself should come down with coronavirus.  She is a DNR but patient's family member who is a niece, said she did not know where the patient stood on being intubated but that anything the patient wished would be what needed to  be done.  I will discuss this with patient.  All questions asked and answered and meeting was returned.     Time spent greater than 35 minutes Merrilee SeashoreAnne Josephene Marrone, MD

## 2018-07-15 ENCOUNTER — Other Ambulatory Visit: Payer: Self-pay | Admitting: Internal Medicine

## 2018-07-15 MED ORDER — CLONAZEPAM 0.5 MG PO TABS: 0.5000 mg | ORAL_TABLET | Freq: Every day | ORAL | 0 refills | Status: DC

## 2018-07-24 ENCOUNTER — Non-Acute Institutional Stay (SKILLED_NURSING_FACILITY): Payer: Medicare Other | Admitting: Internal Medicine

## 2018-07-24 ENCOUNTER — Encounter: Payer: Self-pay | Admitting: Internal Medicine

## 2018-07-24 DIAGNOSIS — R0989 Other specified symptoms and signs involving the circulatory and respiratory systems: Secondary | ICD-10-CM | POA: Diagnosis not present

## 2018-07-24 DIAGNOSIS — F329 Major depressive disorder, single episode, unspecified: Secondary | ICD-10-CM | POA: Diagnosis not present

## 2018-07-24 DIAGNOSIS — J189 Pneumonia, unspecified organism: Secondary | ICD-10-CM

## 2018-07-24 DIAGNOSIS — L853 Xerosis cutis: Secondary | ICD-10-CM | POA: Diagnosis not present

## 2018-07-24 DIAGNOSIS — R05 Cough: Secondary | ICD-10-CM | POA: Diagnosis not present

## 2018-07-24 DIAGNOSIS — F32A Depression, unspecified: Secondary | ICD-10-CM

## 2018-07-24 NOTE — Progress Notes (Deleted)
: Provider:  Randon GoldsmithAnne D. [name] MD. Location:  [facility] Room Number: 424-D Place of Service:  SNF (31)  PCP: Margit HanksAlexander, [name], MD Patient Care Team: Margit HanksAlexander, [name], MD as PCP - General (Internal Medicine)  Extended Emergency Contact Information Primary Emergency Contact: Docia BarrierHopkins,Tammy          [name] [phone] [place] Home Phone: [phone] Relation: Niece Secondary Emergency Contact: [name],Donna  [place] Home Phone: [phone] Relation: Niece     Allergies: Azithromycin, Cephalexin, Cephalosporins, Ciprofloxacin, Darvon [propoxyphene], Flagyl [metronidazole], Librax [chlordiazepoxide-clidinium], Macrodantin [nitrofurantoin macrocrystal], Nitrofuran derivatives, Other, Oxycodone-acetaminophen, Percocet [oxycodone-acetaminophen], Quetiapine, Sulfa antibiotics, and Sulfamethoxazole  Chief Complaint  Patient presents with   Medical Management of Chronic Issues    Routine visit of medical management    HPI: Patient is 78 y.o. female who  Past Medical History:  Diagnosis Date   Acute encephalopathy 02/13/2016   Anemia    Anxiety    Arthritis    Asthma    Barrett's esophagus    Chronic kidney disease    COPD (chronic obstructive pulmonary disease) (HCC)    Dementia (HCC)    Dementia with behavioral disturbance (HCC) 02/13/2016   Depression    Disease of thyroid gland    Diverticulitis    GERD (gastroesophageal reflux disease)    Hyperlipemia    Hypertension    Memory difficulties    Osteoporosis    Pancreatitis    Paroxysmal atrial fibrillation with RVR (HCC) 05/10/2016   Psoriasis (a type of skin inflammation)    Vertigo    Vitamin D deficiency 08/31/2016    Past Surgical History:  Procedure Laterality Date   BACK SURGERY     brain shun     CHOLECYSTECTOMY     ROTATOR CUFF REPAIR     SHOULDER SURGERY     UPPER GI ENDOSCOPY  12/19/2015   [facility] Endo, Include  esophagus, stomach & duodenum or / Jejunum: Dx w/wo biopsys and dilatation; Surgeon : Leonia ReevesAlbert John Rhoton MD    Allergies as of 07/24/2018      Reactions   Azithromycin Other (See Comments)   unknown   Cephalexin Other (See Comments)   unknown   Cephalosporins    Ciprofloxacin Other (See Comments)   unknown   Darvon [propoxyphene]    Flagyl [metronidazole] Other (See Comments)   unknown   Librax [chlordiazepoxide-clidinium] Other (See Comments)   unknown   Macrodantin [nitrofurantoin Macrocrystal]    Nitrofuran Derivatives Other (See Comments)   unknown   Other Other (See Comments)   unknown   Oxycodone-acetaminophen Other (See Comments)   unknown   Percocet [oxycodone-acetaminophen]    Quetiapine Other (See Comments)   unknown Potassium level dropped.   Sulfa Antibiotics    Sulfamethoxazole Other (See Comments)   unknown      Medication List       Accurate as of July 24, 2018  4:26 PM. If you have any questions, ask your nurse or doctor.        acetaminophen 500 MG tablet Commonly known as: TYLENOL Take 500 mg by mouth 2 (two) times daily.   acetaminophen 325 MG tablet Commonly known as: TYLENOL Take 650 mg by mouth every 8 (eight) hours as needed.   cetirizine 10 MG tablet Commonly known as: ZYRTEC Take 10 mg by mouth at bedtime.   clonazePAM 1 MG tablet Commonly known as: KLONOPIN Give 1 mg by mouth at bedtime   clonazePAM 0.5  MG tablet Commonly known as: KLONOPIN Take 1 tablet (0.5 mg total) by mouth daily.   D3-1000 25 MCG (1000 UT) tablet Generic drug: Cholecalciferol Take 1,000 Units by mouth daily.   donepezil 10 MG tablet Commonly known as: ARICEPT Take 10 mg by mouth at bedtime.   famotidine 20 MG tablet Commonly known as: PEPCID Take 20 mg by mouth at bedtime.   fluticasone 50 MCG/ACT nasal spray Commonly known as: FLONASE Place 2 sprays into both nostrils daily as needed for allergies.   guaiFENesin 100 MG/5ML liquid Commonly  known as: ROBITUSSIN TAKE 400MG  = 20 ML PO EVERY 6HOURS AS NEEDED FOR COUGH   guaiFENesin 100 MG/5ML liquid Commonly known as: ROBITUSSIN Give Guiafenesin 10cc p.o. every 4 hours prn for cough   guaiFENesin 100 MG/5ML liquid Commonly known as: ROBITUSSIN GUAIFENESIN-DM 100-10 MG/5 ML-TAKE 400MG  = 20 ML PO QID FOR COUGH   HYDROcodone-acetaminophen 5-325 MG tablet Commonly known as: NORCO/VICODIN Take 1 tablet by mouth every 8 (eight) hours.   levothyroxine 100 MCG tablet Commonly known as: SYNTHROID Take 100 mcg by mouth daily before breakfast.   oxybutynin 5 MG tablet Commonly known as: DITROPAN Take 5 mg by mouth 3 (three) times daily.   pantoprazole 40 MG tablet Commonly known as: PROTONIX Take 40 mg by mouth 2 (two) times daily.   rOPINIRole 0.25 MG tablet Commonly known as: REQUIP Take 0.5 mg by mouth 2 (two) times a day. Take 2 tablets (0.5 mg)   theophylline 100 MG 12 hr tablet Commonly known as: THEODUR Take 100 mg by mouth 2 (two) times daily. COPD   venlafaxine XR 75 MG 24 hr capsule Commonly known as: EFFEXOR-XR take 1 tab po daily for depression What changed: Another medication with the same name was removed. Continue taking this medication, and follow the directions you see here. Changed by: Inocencio Homes, MD       No orders of the defined types were placed in this encounter.   Immunization History  Administered Date(s) Administered   Influenza, High Dose Seasonal PF 12/20/2014   Influenza, Seasonal, Injecte, Preservative Fre 10/10/2014   Influenza-Unspecified 12/07/2015, 11/21/2016   PPD Test 02/10/2016, 02/26/2016   Pneumococcal Conjugate-13 12/20/2014   Pneumococcal Polysaccharide-23 02/07/2017   Td 08/26/2014    Social History   Tobacco Use   Smoking status: Former Smoker    Quit date: 12/17/1990    Years since quitting: 27.6   Smokeless tobacco: Never Used  Substance Use Topics   Alcohol use: No    Family history is    Family History  Problem Relation Age of Onset   Arthritis Mother    Heart disease Mother    Hypertension Mother    Cancer Father    Arthritis Father    Heart disease Father    Alzheimer's disease Sister    Arthritis Sister    Heart disease Sister    Memory loss Sister    Cancer Sister    Heart attack Sister    Cancer Brother    Lung disease Brother    Heart disease Brother       Review of Systems  DATA OBTAINED: from patient, nurse, medical record, family member GENERAL:  no fevers, fatigue, appetite changes SKIN: No itching, or rash EYES: No eye pain, redness, discharge EARS: No earache, tinnitus, change in hearing NOSE: No congestion, drainage or bleeding  MOUTH/THROAT: No mouth or tooth pain, No sore throat RESPIRATORY: No cough, wheezing, SOB CARDIAC: No chest pain, palpitations, lower  extremity edema  GI: No abdominal pain, No N/V/D or constipation, No heartburn or reflux  GU: No dysuria, frequency or urgency, or incontinence  MUSCULOSKELETAL: No unrelieved bone/joint pain NEUROLOGIC: No headache, dizziness or focal weakness PSYCHIATRIC: No c/o anxiety or sadness   Vitals:   07/24/18 1608  BP: 102/64  Pulse: 91  Resp: 20  Temp: (!) 97 F (36.1 C)  SpO2: 98%    SpO2 Readings from Last 1 Encounters:  07/24/18 98%   Body mass index is 25.9 kg/m.     Physical Exam  GENERAL APPEARANCE: Alert, conversant,  No acute distress.  SKIN: No diaphoresis rash HEAD: Normocephalic, atraumatic  EYES: Conjunctiva/lids clear. Pupils round, reactive. EOMs intact.  EARS: External exam WNL, canals clear. Hearing grossly normal.  NOSE: No deformity or discharge.  MOUTH/THROAT: Lips w/o lesions  RESPIRATORY: Breathing is even, unlabored. Lung sounds are clear   CARDIOVASCULAR: Heart RRR no murmurs, rubs or gallops. No peripheral edema.   GASTROINTESTINAL: Abdomen is soft, non-tender, not distended w/ normal bowel sounds. GENITOURINARY: Bladder non  tender, not distended  MUSCULOSKELETAL: No abnormal joints or musculature NEUROLOGIC:  Cranial nerves 2-12 grossly intact. Moves all extremities  PSYCHIATRIC: Mood and affect appropriate to situation, no behavioral issues  Patient Active Problem List   Diagnosis Date Noted   Restless leg syndrome 05/19/2018   Chronic generalized pain 05/19/2018   Hypotension 08/31/2016   Postural dizziness with presyncope 08/31/2016   Chest pain 08/31/2016   Paroxysmal atrial fibrillation (HCC) 08/31/2016   Hypokalemia 08/31/2016   Depression 08/31/2016   Vitamin D deficiency 08/31/2016   Mood disorder (HCC) 08/22/2016   PNA (pneumonia) 05/10/2016   COPD exacerbation (HCC) 05/10/2016   UTI (urinary tract infection) 05/10/2016   Paroxysmal atrial fibrillation with RVR (HCC) 05/10/2016   Metabolic encephalopathy 05/10/2016   Elevated amylase and lipase 02/13/2016   Acute encephalopathy 02/13/2016   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) 02/13/2016   Dementia with behavioral disturbance (HCC) 02/13/2016   Overactive bladder 02/13/2016   Chronic kidney disease    COPD (chronic obstructive pulmonary disease) (HCC)    Anemia    Anxiety    Barrett's esophagus    Hypothyroidism    GERD (gastroesophageal reflux disease)    Hypertension    Memory difficulties    Osteoporosis    Psoriasis (a type of skin inflammation)       Labs reviewed: Basic Metabolic Panel:    Component Value Date/Time   NA 143 01/27/2018   NA 143 03/18/2017   K 4.1 01/27/2018   K 3.8 03/18/2017   CL 100 (L) 01/26/2016 1935   CO2 30 01/26/2016 1935   GLUCOSE 115 (H) 01/26/2016 1935   BUN 12 01/27/2018   CREATININE 0.6 01/27/2018   CREATININE 0.64 03/18/2017   CALCIUM 9.2 03/18/2017   ALBUMIN 3.8 03/18/2017   AST 13 12/22/2017   AST 9 03/18/2017   ALT 8 12/22/2017   ALT 7 03/18/2017   ALKPHOS 88 12/22/2017   ALKPHOS 99 03/18/2017   BILITOT 0.2 03/18/2017   GFRNONAA 85.41 03/18/2017   GFRAA  >60 01/26/2016 1935    Recent Labs    12/22/17 01/27/18  NA 142 143  K 3.9 4.1  BUN 15 12  CREATININE 0.6 0.6   Liver Function Tests: Recent Labs    12/22/17  AST 13  ALT 8  ALKPHOS 88   No results for input(s): LIPASE, AMYLASE in the last 8760 hours. No results for input(s): AMMONIA in the last 8760 hours.  CBC: Recent Labs    12/22/17 01/27/18  WBC 5.4 6.3  HGB 11.0* 10.7*  HCT 31* 31*  PLT 236 408*   Lipid Recent Labs    12/22/17  CHOL 185  HDL 34*  LDLCALC 110  TRIG 203*    Cardiac Enzymes: No results for input(s): CKTOTAL, CKMB, CKMBINDEX, TROPONINI in the last 8760 hours. BNP: No results for input(s): BNP in the last 8760 hours. No results found for: MICROALBUR Lab Results  Component Value Date   HGBA1C 5.4 12/22/2017   Lab Results  Component Value Date   TSH 3.88 12/22/2017   Lab Results  Component Value Date   VITAMINB12 1,188 05/24/2016   No results found for: FOLATE No results found for: IRON, TIBC, FERRITIN  Imaging and Procedures obtained prior to SNF admission: Ct Abdomen Pelvis W Contrast  Result Date: 01/26/2016 CLINICAL DATA:  Lower abdominal pain and low back pain EXAM: CT ABDOMEN AND PELVIS WITH CONTRAST TECHNIQUE: Multidetector CT imaging of the abdomen and pelvis was performed using the standard protocol following bolus administration of intravenous contrast. CONTRAST:  <MEASUREMENTJohnson & Johnson2[facility] - [phone].FK34ArCustome1610936s63eLis<MEASUREMENJohnson & Johnson>[phone].FK70ArCustome1658 sJohnson & Johnson2[facility] [phone].FK93ArCustome161094s15eLis tJohnson & Johnson2[facility] [phone].FK22ArCustome1610946s57eLis tJohnson & Johnson2[facility] [phone].FK38ArCustome1610964s62eLis tJohnson & Johnson2[facility] [phone].FK30ArCustome1610967s66eLis tJohnson & Johnson2[facility] [phone].FK67ArCustome161091s74eLis tJohnson & Johnson2[facility] [phone].FK41ArCustome1610968s27eLis tJohnson & Johnson2[facility] [phone].FK3ArCustome1610914s70eLis tJohnson & Johnson2[facility] [phone].FK42ArCustome1610953s67eLis tJohnson & Johnson2[facility] [phone].FK4ArCustome1610957s75eLis tJohnson & Johnson2[facility] [phone].FK64ArCustome1610499s9eLis tJohnson & Johnson2[facility] [phone].FK37ArCustome1610938s68eLis tJohnson & Johnson2[facility] [phone].FK62ArCustome1610965s25eLis tJohnson & Johnson2[facility] [phone].FK46ArCustome1610963s22eLis tJohnson & Johnson2[facility] [phone].FK1ArCustome1610979s61eLis tJohnson & Johnson2[facility] [phone].FK38ArCustome1610959s52eLis tJohnson & Johnson2[facility] [phone].FK16ArCustome1610985s43eLisette GrindernagerOPAMIDOL (ISOVUE-300) INJECTION 61% COMPARISON:  Chest radiograph 11/01/2007 FINDINGS: Lower chest: No pulmonary nodules. No visible pleural or pericardial effusion. Hepatobiliary: Normal hepatic size and contours without focal liver lesion. No perihepatic ascites. No intra- or extrahepatic biliary dilatation. Status post cholecystectomy. Pancreas: There is inflammatory stranding surrounding the head of the pancreas. No peripancreatic fluid collection. The pancreatic duct is not dilated. Spleen: Normal. Adrenals/Urinary Tract: Normal adrenal glands. No  hydronephrosis or solid renal mass. Stomach/Bowel: There is descending colonic and sigmoid diverticulosis without evidence of acute inflammation. No dilated small bowel. No abdominal fluid collection. Normal appendix. Vascular/Lymphatic: There is atherosclerotic calcification of the non aneurysmal abdominal aorta. No abdominal or pelvic adenopathy. Reproductive: Normal uterus and ovaries for age. Musculoskeletal: There is compression deformity of the T8 vertebral body, favored to be chronic. This appears unchanged relative to the radiograph of 11/01/2007 Normal visualized extrathoracic and extraperitoneal soft tissues. Other: No contributory non-categorized findings. IMPRESSION: 1. Acute pancreatitis without evidence of pancreatic necrosis or peripancreatic fluid collection. 2. Aortic atherosclerosis. 3. Descending colonic and sigmoid diverticulosis without acute diverticulitis. Electronically Signed   By: Kevin  Herman M.D.   On: 01/26/2016 21:57     Not all labs, radiology exams or other studies done during hospitalization come through on my EPIC note; however they are reviewed by me.    Assessment and Plan  No problem-specific Assessment & Plan notes found for this encounter.   [name] [name], MD

## 2018-07-24 NOTE — Progress Notes (Signed)
: Provider:  Randon GoldsmithAnne D. Chamaine Stankus MD. Location:  [facility] Room Number: 424-D Place of Service:  SNF [phone])  PCP: Margit HanksAlexander, Deon Duer D, MD Patient Care Team: Margit HanksAlexander, Aleyah Balik D, MD as PCP - General (Internal Medicine)  Extended Emergency Contact Information Primary Emergency Contact: Docia BarrierHopkins,Tammy          [name] [phone] [place] Home Phone: [phone] Relation: Niece Secondary Emergency Contact: [name],Donna  [place] Home Phone: [phone] Relation: Niece     Allergies: Azithromycin, Cephalexin, Cephalosporins, Ciprofloxacin, Darvon [propoxyphene], Flagyl [metronidazole], Librax [chlordiazepoxide-clidinium], Macrodantin [nitrofurantoin macrocrystal], Nitrofuran derivatives, Other, Oxycodone-acetaminophen, Percocet [oxycodone-acetaminophen], Quetiapine, Sulfa antibiotics, and Sulfamethoxazole  Chief Complaint  Patient presents with  . Acute Visit    Acute Visit    HPI: Patient is 78 y.o. female who nursing asked me to see because patient has been upset for several days and is crying, wants to go home.  Patient says that she does not feel well.  Denies coughing shortness of breath.  No reported fever.  Past Medical History:  Diagnosis Date  . Acute encephalopathy 02/13/2016  . Anemia   . Anxiety   . Arthritis   . Asthma   . Barrett's esophagus   . Chronic kidney disease   . COPD (chronic obstructive pulmonary disease) (HCC)   . Dementia (HCC)   . Dementia with behavioral disturbance (HCC) 02/13/2016  . Depression   . Disease of thyroid gland   . Diverticulitis   . GERD (gastroesophageal reflux disease)   . Hyperlipemia   . Hypertension   . Memory difficulties   . Osteoporosis   . Pancreatitis   . Paroxysmal atrial fibrillation with RVR (HCC) 05/10/2016  . Psoriasis (a type of skin inflammation)   . Vertigo   . Vitamin D deficiency 08/31/2016    Past Surgical History:  Procedure Laterality Date  . BACK  SURGERY    . brain shun    . CHOLECYSTECTOMY    . ROTATOR CUFF REPAIR    . SHOULDER SURGERY    . UPPER GI ENDOSCOPY  12/19/2015   [facility] End[facility], Include esophagus, stomach & duodenum or / Jejunum: Dx w/wo biopsys and dilatation; Surgeon : Leonia ReevesAlbert John Rhoton MD    Allergies as of 07/24/2018      Reactions   Azithromycin Other (See Comments)   unknown   Cephalexin Other (See Comments)   unknown   Cephalosporins    Ciprofloxacin Other (See Comments)   unknown   Darvon [propoxyphene]    Flagyl [metronidazole] Other (See Comments)   unknown   Librax [chlordiazepoxide-clidinium] Other (See Comments)   unknown   Macrodantin [nitrofurantoin Macrocrystal]    Nitrofuran Derivatives Other (See Comments)   unknown   Other Other (See Comments)   unknown   Oxycodone-acetaminophen Other (See Comments)   unknown   Percocet [oxycodone-acetaminophen]    Quetiapine Other (See Comments)   unknown Potassium level dropped.   Sulfa Antibiotics    Sulfamethoxazole Other (See Comments)   unknown      Medication List       Accurate as of July 24, 2018  4:32 PM. If you have any questions, ask your nurse or doctor.        acetaminophen 500 MG tablet Commonly known as: TYLENOL Take 500 mg by mouth 2 (two) times daily.   acetaminophen 325 MG tablet Commonly known as: TYLENOL Take 650 mg by mouth every 8 (eight) hours as needed.   cetirizine 10 MG  tablet Commonly known as: ZYRTEC Take 10 mg by mouth at bedtime.   clonazePAM 1 MG tablet Commonly known as: KLONOPIN Give 1 mg by mouth at bedtime   clonazePAM 0.5 MG tablet Commonly known as: KLONOPIN Take 1 tablet (0.5 mg total) by mouth daily.   D3-1000 25 MCG (1000 UT) tablet Generic drug: Cholecalciferol Take 1,000 Units by mouth daily.   donepezil 10 MG tablet Commonly known as: ARICEPT Take 10 mg by mouth at bedtime.   famotidine 20 MG tablet Commonly known as: PEPCID Take 20 mg by mouth at bedtime.   fluticasone 50 MCG/ACT  nasal spray Commonly known as: FLONASE Place 2 sprays into both nostrils daily as needed for allergies.   guaiFENesin 100 MG/5ML liquid Commonly known as: ROBITUSSIN TAKE 400MG  = 20 ML PO EVERY 6HOURS AS NEEDED FOR COUGH   guaiFENesin 100 MG/5ML liquid Commonly known as: ROBITUSSIN Give Guiafenesin 10cc p.o. every 4 hours prn for cough   guaiFENesin 100 MG/5ML liquid Commonly known as: ROBITUSSIN GUAIFENESIN-DM 100-10 MG/5 ML-TAKE 400MG  = 20 ML PO QID FOR COUGH   HYDROcodone-acetaminophen 5-325 MG tablet Commonly known as: NORCO/VICODIN Take 1 tablet by mouth every 8 (eight) hours.   levothyroxine 100 MCG tablet Commonly known as: SYNTHROID Take 100 mcg by mouth daily before breakfast.   oxybutynin 5 MG tablet Commonly known as: DITROPAN Take 5 mg by mouth 3 (three) times daily.   pantoprazole 40 MG tablet Commonly known as: PROTONIX Take 40 mg by mouth 2 (two) times daily.   rOPINIRole 0.25 MG tablet Commonly known as: REQUIP Take 0.5 mg by mouth 2 (two) times a day. Take 2 tablets (0.5 mg)   theophylline 100 MG 12 hr tablet Commonly known as: THEODUR Take 100 mg by mouth 2 (two) times daily. COPD   venlafaxine XR 75 MG 24 hr capsule Commonly known as: EFFEXOR-XR take 1 tab po daily for depression What changed: Another medication with the same name was removed. Continue taking this medication, and follow the directions you see here. Changed by: Merrilee SeashoreAnne Tenasia Aull, MD       No orders of the defined types were placed in this encounter.   Immunization History  Administered Date(s) Administered  . Influenza, High Dose Seasonal PF 12/20/2014  . Influenza, Seasonal, Injecte, Preservative Fre 10/10/2014  . Influenza-Unspecified 12/07/2015, 11/21/2016  . PPD Test 02/10/2016, 02/26/2016  . Pneumococcal Conjugate-13 12/20/2014  . Pneumococcal Polysaccharide-23 02/07/2017  . Td 08/26/2014    Social History   Tobacco Use  . Smoking status: Former Smoker    Quit  date: 12/17/1990    Years since quitting: 27.6  . Smokeless tobacco: Never Used  Substance Use Topics  . Alcohol use: No    Family history is   Family History  Problem Relation Age of Onset  . Arthritis Mother   . Heart disease Mother   . Hypertension Mother   . Cancer Father   . Arthritis Father   . Heart disease Father   . Alzheimer's disease Sister   . Arthritis Sister   . Heart disease Sister   . Memory loss Sister   . Cancer Sister   . Heart attack Sister   . Cancer Brother   . Lung disease Brother   . Heart disease Brother       Review of Systems  DATA OBTAINED: from patient, nurse GENERAL:  no fevers,+ fatigue, appetite changes SKIN: Skin on face is dry EYES: No eye pain, redness, discharge EARS: No earache, tinnitus,  change in hearing NOSE: No congestion, drainage or bleeding  MOUTH/THROAT: No mouth or tooth pain, No sore throat RESPIRATORY: No cough, wheezing, SOB CARDIAC: No chest pain, palpitations, lower extremity edema  GI: No abdominal pain, No N/V/D or constipation, No heartburn or reflux  GU: No dysuria, frequency or urgency, or incontinence  MUSCULOSKELETAL: No unrelieved bone/joint pain NEUROLOGIC: No headache, dizziness or focal weakness PSYCHIATRIC: No c/o anxiety or sadness   Vitals:   07/24/18 1608  BP: 102/64  Pulse: 91  Resp: 20  Temp: (!) 97 F (36.1 C)  SpO2: 98%    SpO2 Readings from Last 1 Encounters:  07/24/18 98%   Body mass index is 25.9 kg/m.     Physical Exam  GENERAL APPEARANCE: Alert, conversant,  No acute distress.  SKIN: Mild redness and scale to the nasolabial fold bilaterally, dryness to the facial skin and neck HEAD: Normocephalic, atraumatic  EYES: Conjunctiva/lids clear. Pupils round, reactive. EOMs intact.  EARS: External exam WNL, canals clear. Hearing grossly normal.  NOSE: No deformity or discharge.  MOUTH/THROAT: Lips w/o lesions  RESPIRATORY: Breathing is even, unlabored. Lung sounds are mild  rhonchi bilaterally CARDIOVASCULAR: Heart RRR no murmurs, rubs or gallops. No peripheral edema.   GASTROINTESTINAL: Abdomen is soft, non-tender, not distended w/ normal bowel sounds. GENITOURINARY: Bladder non tender, not distended  MUSCULOSKELETAL: No abnormal joints or musculature NEUROLOGIC:  Cranial nerves 2-12 grossly intact. Moves all extremities  PSYCHIATRIC: Mood and affect appropriate to situation but flat, no behavioral issues  Patient Active Problem List   Diagnosis Date Noted  . Restless leg syndrome 05/19/2018  . Chronic generalized pain 05/19/2018  . Hypotension 08/31/2016  . Postural dizziness with presyncope 08/31/2016  . Chest pain 08/31/2016  . Paroxysmal atrial fibrillation ([place]) 08/31/2016  . Hypokalemia 08/31/2016  . Depression 08/31/2016  . Vitamin D deficiency 08/31/2016  . Mood disorder ([place]) 08/22/2016  . PNA (pneumonia) 05/10/2016  . COPD exacerbation ([place]) 05/10/2016  . UTI (urinary tract infection) 05/10/2016  . Paroxysmal atrial fibrillation with RVR ([place]) 05/10/2016  . Metabolic encephalopathy 44/04/4740  . Elevated amylase and lipase 02/13/2016  . Acute encephalopathy 02/13/2016  . Psychosis in elderly ([place]) 02/13/2016  . Dementia with behavioral disturbance ([place]) 02/13/2016  . Overactive bladder 02/13/2016  . Chronic kidney disease   . COPD (chronic obstructive pulmonary disease) ([place])   . Anemia   . Anxiety   . Barrett's esophagus   . Hypothyroidism   . GERD (gastroesophageal reflux disease)   . Hypertension   . Memory difficulties   . Osteoporosis   . Psoriasis (a type of skin inflammation)       Labs reviewed: Basic Metabolic Panel:    Component Value Date/Time   NA 143 01/27/2018   NA 143 03/18/2017   K 4.1 01/27/2018   K 3.8 03/18/2017   CL 100 (L) 01/26/2016 1935   CO2 30 01/26/2016 1935   GLUCOSE 115 (H) 01/26/2016 1935   BUN 12 01/27/2018   CREATININE 0.6 01/27/2018   CREATININE 0.64 03/18/2017   CALCIUM 9.2 03/18/2017    ALBUMIN 3.8 03/18/2017   AST 13 12/22/2017   AST 9 03/18/2017   ALT 8 12/22/2017   ALT 7 03/18/2017   ALKPHOS 88 12/22/2017   ALKPHOS 99 03/18/2017   BILITOT 0.2 03/18/2017   GFRNONAA 85.41 03/18/2017   GFRAA >60 01/26/2016 1935    Recent Labs    12/22/17 01/27/18  NA 142 143  K 3.9 4.1  BUN 15 12  CREATININE  0.6 0.6   Liver Function Tests: Recent Labs    12/22/17  AST 13  ALT 8  ALKPHOS 88   No results for input(s): LIPASE, AMYLASE in the last 8760 hours. No results for input(s): AMMONIA in the last 8760 hours. CBC: Recent Labs    12/22/17 01/27/18  WBC 5.4 6.3  HGB 11.0* 10.7*  HCT 31* 31*  PLT 236 408*   Lipid Recent Labs    12/22/17  CHOL 185  HDL 34*  LDLCALC 110  TRIG 203*    Cardiac Enzymes: No results for input(s): CKTOTAL, CKMB, CKMBINDEX, TROPONINI in the last 8760 hours. BNP: No results for input(s): BNP in the last 8760 hours. No results found for: [facility] Lab Results  Component Value Date   HGBA1C 5.4 12/22/2017   Lab Results  Component Value Date   TSH 3.88 12/22/2017   Lab Results  Component Value Date   VITAMINB12 1,188 05/24/2016   No results found for: FOLATE No results found for: IRON, TIBC, FERRITIN  Imaging and Procedures obtained prior to SNF admission: Ct Abdomen Pelvis W Contrast  Result Date: 01/26/2016 CLINICAL DATA:  Lower abdominal pain and low back pain EXAM: CT ABDOMEN AND PELVIS WITH CONTRAST TECHNIQUE: Multidetector CT imaging of the abdomen and pelvis was performed using the standard protocol following bolus administration of intravenous contrast. CONTRAST:  100mL ISOVUE-300 IOPAMIDOL (ISOVUE-300) INJECTION 61% COMPARISON:  Chest radiograph 11/01/2007 FINDINGS: Lower chest: No pulmonary nodules. No visible pleural or pericardial effusion. Hepatobiliary: Normal hepatic size and contours without focal liver lesion. No perihepatic ascites. No intra- or extrahepatic biliary dilatation. Status post cholecystectomy.  Pancreas: There is inflammatory stranding surrounding the head of the pancreas. No peripancreatic fluid collection. The pancreatic duct is not dilated. Spleen: Normal. Adrenals/Urinary Tract: Normal adrenal glands. No hydronephrosis or solid renal mass. Stomach/Bowel: There is descending colonic and sigmoid diverticulosis without evidence of acute inflammation. No dilated small bowel. No abdominal fluid collection. Normal appendix. Vascular/Lymphatic: There is atherosclerotic calcification of the non aneurysmal abdominal aorta. No abdominal or pelvic adenopathy. Reproductive: Normal uterus and ovaries for age. Musculoskeletal: There is compression deformity of the T8 vertebral body, favored to be chronic. This appears unchanged relative to the radiograph of 11/01/2007 Normal visualized extrathoracic and extraperitoneal soft tissues. Other: No contributory non-categorized findings. IMPRESSION: 1. Acute pancreatitis without evidence of pancreatic necrosis or peripancreatic fluid collection. 2. Aortic atherosclerosis. 3. Descending colonic and sigmoid diverticulosis without acute diverticulitis. Electronically Signed   By: Deatra RobinsonKevin  Herman M.D.   On: 01/26/2016 21:57     Not all labs, radiology exams or other studies done during hospitalization come through on my EPIC note; however they are reviewed by me.    Assessment and Plan  Depression- a lot of people are getting depressed now that they are in their rooms all the time and socialization is much less no visitors; increase Effexor from 37.5 daily to 75 mg daily  Rhonchi on exam-chest x-ray PA and lateral  Xerosis-1% hydrocortisone cream to mid face daily and Eucerin cream daily at a different time   Late entry- patient with bilateral pneumonia worse than prior x-ray x-ray a month ago  Bilateral pneumonia- patient meets criteria to go to isolation unit patient is not at this time requiring oxygen O2 saturation 95%, patient is drinking all right  patient will be getting her first coronavirus test as we speak; will be closely monitored   Time spent greater than 35 minutes Margit HanksAnne D Eufemia Prindle, MD

## 2018-07-26 ENCOUNTER — Encounter: Payer: Self-pay | Admitting: Internal Medicine

## 2018-07-26 DIAGNOSIS — L853 Xerosis cutis: Secondary | ICD-10-CM | POA: Insufficient documentation

## 2018-07-26 DIAGNOSIS — J189 Pneumonia, unspecified organism: Secondary | ICD-10-CM | POA: Insufficient documentation

## 2018-07-26 DIAGNOSIS — R0989 Other specified symptoms and signs involving the circulatory and respiratory systems: Secondary | ICD-10-CM | POA: Insufficient documentation

## 2018-07-27 DIAGNOSIS — R1312 Dysphagia, oropharyngeal phase: Secondary | ICD-10-CM | POA: Diagnosis not present

## 2018-07-27 DIAGNOSIS — M6281 Muscle weakness (generalized): Secondary | ICD-10-CM | POA: Diagnosis not present

## 2018-07-27 DIAGNOSIS — R262 Difficulty in walking, not elsewhere classified: Secondary | ICD-10-CM | POA: Diagnosis not present

## 2018-07-27 DIAGNOSIS — I959 Hypotension, unspecified: Secondary | ICD-10-CM | POA: Diagnosis not present

## 2018-07-29 ENCOUNTER — Non-Acute Institutional Stay (SKILLED_NURSING_FACILITY): Payer: Medicare Other | Admitting: Internal Medicine

## 2018-07-29 ENCOUNTER — Encounter: Payer: Self-pay | Admitting: Internal Medicine

## 2018-07-29 DIAGNOSIS — J449 Chronic obstructive pulmonary disease, unspecified: Secondary | ICD-10-CM

## 2018-07-29 DIAGNOSIS — I10 Essential (primary) hypertension: Secondary | ICD-10-CM | POA: Diagnosis not present

## 2018-07-29 DIAGNOSIS — I480 Paroxysmal atrial fibrillation: Secondary | ICD-10-CM | POA: Diagnosis not present

## 2018-07-29 NOTE — Progress Notes (Signed)
Location:  [facility] [place] Room Number: 213-W Place of Service:  SNF (31)  Hennie Duos, MD  Patient Care Team: Hennie Duos, MD as PCP - General (Internal Medicine)  Extended Emergency Contact Information Primary Emergency Contact: Jerral Bonito [phone] Johnnette Litter of [place] Phone: [phone] Relation: Niece Secondary Emergency Contact: Karen Chafe [place] Phone: [phone] Relation: Niece    Allergies: Azithromycin, Cephalexin, Cephalosporins, Ciprofloxacin, Darvon [propoxyphene], Flagyl [metronidazole], Librax [chlordiazepoxide-clidinium], Macrodantin [nitrofurantoin macrocrystal], Nitrofuran derivatives, Other, Oxycodone-acetaminophen, Percocet [oxycodone-acetaminophen], Quetiapine, Sulfa antibiotics, and Sulfamethoxazole  Chief Complaint  Patient presents with  . Medical Management of Chronic Issues    Routine [facility] visit    HPI: Patient is a 78 y.o. female who is being seen for routine issues of hypertension, paroxysmal atrial fib, and COPD.  Past Medical History:  Diagnosis Date  . Acute encephalopathy 02/13/2016  . Anemia   . Anxiety   . Arthritis   . Asthma   . Barrett's esophagus   . Chronic kidney disease   . COPD (chronic obstructive pulmonary disease) ([place])   . Dementia ([place])   . Dementia with behavioral disturbance ([place]) 02/13/2016  . Depression   . Disease of thyroid gland   . Diverticulitis   . GERD (gastroesophageal reflux disease)   . Hyperlipemia   . Hypertension   . Memory difficulties   . Osteoporosis   . Pancreatitis   . Paroxysmal atrial fibrillation with RVR ([place]) 05/10/2016  . Psoriasis (a type of skin inflammation)   . Vertigo   . Vitamin D deficiency 08/31/2016    Past Surgical History:  Procedure Laterality Date  . BACK SURGERY    . brain shun    . CHOLECYSTECTOMY    . ROTATOR CUFF REPAIR    . SHOULDER SURGERY    . UPPER GI ENDOSCOPY   12/19/2015   [facility] Endo, Include esophagus, stomach & duodenum or / Jejunum: Dx w/wo biopsys and dilatation; Surgeon : Cristie Hem Rhoton MD    Allergies as of 07/29/2018      Reactions   Azithromycin Other (See Comments)   unknown   Cephalexin Other (See Comments)   unknown   Cephalosporins    Ciprofloxacin Other (See Comments)   unknown   Darvon [propoxyphene]    Flagyl [metronidazole] Other (See Comments)   unknown   Librax [chlordiazepoxide-clidinium] Other (See Comments)   unknown   Macrodantin [nitrofurantoin Macrocrystal]    Nitrofuran Derivatives Other (See Comments)   unknown   Other Other (See Comments)   unknown   Oxycodone-acetaminophen Other (See Comments)   unknown   Percocet [oxycodone-acetaminophen]    Quetiapine Other (See Comments)   unknown Potassium level dropped.   Sulfa Antibiotics    Sulfamethoxazole Other (See Comments)   unknown      Medication List       Accurate as of July 29, 2018 11:59 PM. If you have any questions, ask your nurse or doctor.        acetaminophen 500 MG tablet Commonly known as: TYLENOL Take 500 mg by mouth 2 (two) times daily.   acetaminophen 325 MG tablet Commonly known as: TYLENOL Take 650 mg by mouth every 8 (eight) hours as needed.   cefTRIAXone 1 g injection Commonly known as: ROCEPHIN Inject 1 g into the muscle daily.   cetirizine 10 MG tablet Commonly known as: ZYRTEC Take 10 mg by mouth at  bedtime.   clonazePAM 1 MG tablet Commonly known as: KLONOPIN Give 1 mg by mouth at bedtime   clonazePAM 0.5 MG tablet Commonly known as: KLONOPIN Take 1 tablet (0.5 mg total) by mouth daily.   D3-1000 25 MCG (1000 UT) tablet Generic drug: Cholecalciferol Take 1,000 Units by mouth daily.   donepezil 10 MG tablet Commonly known as: ARICEPT Take 10 mg by mouth at bedtime.   eucerin cream Apply 1 application topically daily. Apply to face   famotidine 20 MG tablet Commonly known as: PEPCID Take 20 mg by  mouth at bedtime.   fluticasone 50 MCG/ACT nasal spray Commonly known as: FLONASE Place 2 sprays into both nostrils daily as needed for allergies.   guaiFENesin 100 MG/5ML liquid Commonly known as: ROBITUSSIN TAKE 400MG  = 20 ML PO EVERY 6HOURS AS NEEDED FOR COUGH What changed: Another medication with the same name was removed. Continue taking this medication, and follow the directions you see here. Changed by: Merrilee SeashoreAnne Eulis Salazar, MD   guaiFENesin 100 MG/5ML liquid Commonly known as: ROBITUSSIN Give Guiafenesin 10cc p.o. every 4 hours prn for cough What changed: Another medication with the same name was removed. Continue taking this medication, and follow the directions you see here. Changed by: Merrilee SeashoreAnne Marshell Dilauro, MD   HYDROcodone-acetaminophen 5-325 MG tablet Commonly known as: NORCO/VICODIN Take 1 tablet by mouth every 8 (eight) hours.   Hydrocortisone-Aloe 1 % Crea Apply 1 application topically daily. Apply to face and neck   levothyroxine 100 MCG tablet Commonly known as: SYNTHROID Take 100 mcg by mouth daily before breakfast.   oxybutynin 5 MG tablet Commonly known as: DITROPAN Take 5 mg by mouth 3 (three) times daily.   pantoprazole 40 MG tablet Commonly known as: PROTONIX Take 40 mg by mouth 2 (two) times daily.   Robafen DM 10-100 MG/5ML liquid Generic drug: Dextromethorphan-guaiFENesin Take 20 mLs by mouth 4 (four) times daily.   rOPINIRole 0.25 MG tablet Commonly known as: REQUIP Take 0.5 mg by mouth 2 (two) times a day. Take 2 tablets (0.5 mg)   theophylline 100 MG 12 hr tablet Commonly known as: THEODUR Take 100 mg by mouth 2 (two) times daily. COPD   venlafaxine XR 75 MG 24 hr capsule Commonly known as: EFFEXOR-XR take 1 tab po daily for depression       No orders of the defined types were placed in this encounter.   Immunization History  Administered Date(s) Administered  . Influenza, High Dose Seasonal PF 12/20/2014  . Influenza, Seasonal, Injecte,  Preservative Fre 10/10/2014  . Influenza-Unspecified 12/07/2015, 11/21/2016, 11/19/2017  . PPD Test 02/10/2016, 02/26/2016  . Pneumococcal Conjugate-13 12/20/2014  . Pneumococcal Polysaccharide-23 02/07/2017  . Td 08/26/2014    Social History   Tobacco Use  . Smoking status: Former Smoker    Quit date: 12/17/1990    Years since quitting: 27.6  . Smokeless tobacco: Never Used  Substance Use Topics  . Alcohol use: No    Review of Systems  DATA OBTAINED: from patient, nurse GENERAL:  no fevers, fatigue, appetite changes SKIN: No itching, rash HEENT: No complaint RESPIRATORY: No cough, wheezing, SOB CARDIAC: No chest pain, palpitations, lower extremity edema  GI: No abdominal pain, No N/V/D or constipation, No heartburn or reflux  GU: No dysuria, frequency or urgency, or incontinence  MUSCULOSKELETAL: No unrelieved bone/joint pain NEUROLOGIC: No headache, dizziness  PSYCHIATRIC: No overt anxiety or sadness  Vitals:   07/29/18 1212  BP: 120/80  Pulse: 76  Resp: 18  Temp:  97.7 F (36.5 C)  SpO2: 98%   Body mass index is 25.9 kg/m. Physical Exam  GENERAL APPEARANCE: Alert, conversant, No acute distress  SKIN: No diaphoresis rash HEENT: Unremarkable RESPIRATORY: Breathing is even, unlabored. Lung sounds are clear   CARDIOVASCULAR: Heart RRR no murmurs, rubs or gallops. No peripheral edema  GASTROINTESTINAL: Abdomen is soft, non-tender, not distended w/ normal bowel sounds.  GENITOURINARY: Bladder non tender, not distended  MUSCULOSKELETAL: No abnormal joints or musculature NEUROLOGIC: Cranial nerves 2-12 grossly intact. Moves all extremities PSYCHIATRIC: Mood and affect appropriate to situation but a depressed mood secondary to covered restrictions, no behavioral issues  Patient Active Problem List   Diagnosis Date Noted  . Bilateral pneumonia 07/26/2018  . Rhonchi 07/26/2018  . Xerosis cutis 07/26/2018  . Restless leg syndrome 05/19/2018  . Chronic generalized  pain 05/19/2018  . Hypotension 08/31/2016  . Postural dizziness with presyncope 08/31/2016  . Chest pain 08/31/2016  . Paroxysmal atrial fibrillation (HCC) 08/31/2016  . Hypokalemia 08/31/2016  . Depression 08/31/2016  . Vitamin D deficiency 08/31/2016  . Mood disorder (HCC) 08/22/2016  . PNA (pneumonia) 05/10/2016  . COPD exacerbation (HCC) 05/10/2016  . UTI (urinary tract infection) 05/10/2016  . Paroxysmal atrial fibrillation with RVR (HCC) 05/10/2016  . Metabolic encephalopathy 05/10/2016  . Elevated amylase and lipase 02/13/2016  . Acute encephalopathy 02/13/2016  . 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 Dementia with behavioral disturbance (HCC) 02/13/2016  . Overactive bladder 02/13/2016  . Chronic kidney disease   . COPD (chronic obstructive pulmonary disease) (HCC)   . Anemia   . Anxiety   . Barrett's esophagus   . Hypothyroidism   . GERD (gastroesophageal reflux disease)   . Hypertension   . Memory difficulties   . Osteoporosis   . Psoriasis (a type of skin inflammation)     CMP     Component Value Date/Time   NA 143 01/27/2018   NA 143 03/18/2017   K 4.1 01/27/2018   K 3.8 03/18/2017   CL 100 (L) 01/26/2016 1935   CO2 30 01/26/2016 1935   GLUCOSE 115 (H) 01/26/2016 1935   BUN 12 01/27/2018   CREATININE 0.6 01/27/2018   CREATININE 0.64 03/18/2017   CALCIUM 9.2 03/18/2017   ALBUMIN 3.8 03/18/2017   AST 13 12/22/2017   AST 9 03/18/2017   ALT 8 12/22/2017   ALT 7 03/18/2017   ALKPHOS 88 12/22/2017   ALKPHOS 99 03/18/2017   BILITOT 0.2 03/18/2017   GFRNONAA 85.41 03/18/2017   GFRAA >60 01/26/2016 1935   Recent Labs    12/22/17 01/27/18  NA 142 143  K 3.9 4.1  BUN 15 12  CREATININE 0.6 0.6   Recent Labs    12/22/17  AST 13  ALT 8  ALKPHOS 88   Recent Labs    12/22/17 01/27/18  WBC 5.4 6.3  HGB 11.0* 10.7*  HCT 31* 31*  PLT 236 408*   Recent Labs    12/22/17  CHOL 185  LDLCALC 110  TRIG 203*   No results found for: [facility]  Lab Results  Component Value Date   TSH 3.88 12/22/2017   Lab Results  Component Value Date   HGBA1C 5.4 12/22/2017   Lab Results  Component Value Date   CHOL 185 12/22/2017   HDL 34 (A) 12/22/2017   LDLCALC 110 12/22/2017   TRIG 203 (A) 12/22/2017    Significant Diagnostic Results in last 30 days:  No results found.  Assessment and Plan  Hypertension  Controlled; on no meds; continue to monitor  Paroxysmal atrial fibrillation (HCC) Patient on no meds for prophylaxis or rhythm per cardiology; continue to monitor  COPD (chronic obstructive pulmonary disease) (HCC) No exacerbations; continue theophylline 100 mg twice daily, guaifenesin 200 mg twice daily and PRN DuoNeb and albuterol neb    Margit HanksAnne D Carrieanne Kleen, MD

## 2018-07-31 ENCOUNTER — Encounter: Payer: Self-pay | Admitting: Internal Medicine

## 2018-07-31 NOTE — Assessment & Plan Note (Signed)
Patient on no meds for prophylaxis or rhythm per cardiology; continue to monitor

## 2018-07-31 NOTE — Assessment & Plan Note (Signed)
No exacerbations; continue theophylline 100 mg twice daily, guaifenesin 200 mg twice daily and PRN DuoNeb and albuterol neb

## 2018-07-31 NOTE — Assessment & Plan Note (Signed)
Controlled on no meds; continue to monitor 

## 2018-08-19 ENCOUNTER — Other Ambulatory Visit: Payer: Self-pay | Admitting: Internal Medicine

## 2018-08-19 MED ORDER — CLONAZEPAM 0.5 MG PO TABS: 0.5000 mg | ORAL_TABLET | Freq: Every day | ORAL | 0 refills | Status: DC

## 2018-08-22 ENCOUNTER — Other Ambulatory Visit: Payer: Self-pay | Admitting: Internal Medicine

## 2018-08-24 ENCOUNTER — Encounter: Payer: Self-pay | Admitting: Internal Medicine

## 2018-08-24 ENCOUNTER — Non-Acute Institutional Stay (SKILLED_NURSING_FACILITY): Payer: Medicare Other | Admitting: Internal Medicine

## 2018-08-24 DIAGNOSIS — G8929 Other chronic pain: Secondary | ICD-10-CM

## 2018-08-24 DIAGNOSIS — R52 Pain, unspecified: Secondary | ICD-10-CM

## 2018-08-24 DIAGNOSIS — F0281 Dementia in other diseases classified elsewhere with behavioral disturbance: Secondary | ICD-10-CM

## 2018-08-24 DIAGNOSIS — J449 Chronic obstructive pulmonary disease, unspecified: Secondary | ICD-10-CM | POA: Diagnosis not present

## 2018-08-24 DIAGNOSIS — G301 Alzheimer's disease with late onset: Secondary | ICD-10-CM

## 2018-08-24 DIAGNOSIS — I480 Paroxysmal atrial fibrillation: Secondary | ICD-10-CM

## 2018-08-24 DIAGNOSIS — F02818 Dementia in other diseases classified elsewhere, unspecified severity, with other behavioral disturbance: Secondary | ICD-10-CM

## 2018-08-24 DIAGNOSIS — E034 Atrophy of thyroid (acquired): Secondary | ICD-10-CM | POA: Diagnosis not present

## 2018-08-24 DIAGNOSIS — I10 Essential (primary) hypertension: Secondary | ICD-10-CM | POA: Diagnosis not present

## 2018-08-24 NOTE — Progress Notes (Addendum)
Location:  [facility] Room Number: 213-W Place of Service:  SNF [phone]) Provider:  Granville Lewis, PA-C  Hennie Duos, MD  Patient Care Team: Hennie Duos, MD as PCP - General (Internal Medicine)  Extended Emergency Contact Information Primary Emergency Contact: Jerral Bonito [phone] Johnnette Litter of [place] Phone: [phone] Relation: Niece Secondary Emergency Contact: Karen Chafe States of [place] Phone: [phone] Relation: Niece  Code Status:  DNR Goals of care: Advanced Directive information Advanced Directives 07/29/2018  Does Patient Have a Medical Advance Directive? Yes  Type of Advance Directive Out of facility DNR (pink MOST or yellow form)  Does patient want to make changes to medical advance directive? No - Patient declined  Copy of [place] in Chart? -  Pre-existing out of facility DNR order (yellow form or pink MOST form) Yellow form placed in chart (order not valid for inpatient use)     Chief Complaint  Patient presents with  . Medical Management of Chronic Issues    Routine [facility] visit  Comanagement of chronic medical conditions including hypertension atrial fibrillation COPD depression hypothyroidism dementia chronic pain and overactive bladder  HPI:  Pt is a 78 y.o. female seen today for medical management of chronic diseases.  As noted above.  Nursing does not report any recent acute issues and nursing does not note any either.  She does have a history of COPD and has been treated for pneumonia previously most recently treated with Levaquin--  this appears to have resolved she does not really complain of any increased cough or shortness of breath she does have somewhat of a chronic dry cough which the Robitussin helps with per nursing staff.  Regards to hypertension this appears stable on no medications recent blood pressures 101/66- 110/70-111/81.   In regards to COPD she continues on theophylline again as well as guaifenesin.  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  She does have some history of chronic pain this appears at this point to be controlled with hydrocodone every 8 hours as needed she also has a PRN Tylenol order.  Regards hypothyroidism she is on Synthroid 100 mcg daily most recent TSH was 3.38.  Currently she is sitting in her chair comfortably she does not really have any acute complaints says at times she does feel tired.     Past Medical History:  Diagnosis Date  . Acute encephalopathy 02/13/2016  . Anemia   . Anxiety   . Arthritis   . Asthma   . Barrett's esophagus   . Chronic kidney disease   . COPD (chronic obstructive pulmonary disease) ([place])   . Dementia ([place])   . Dementia with behavioral disturbance ([place]) 02/13/2016  . Depression   . Disease of thyroid gland   . Diverticulitis   . GERD (gastroesophageal reflux disease)   . Hyperlipemia   . Hypertension   . Memory difficulties   . Osteoporosis   . Pancreatitis   . Paroxysmal atrial fibrillation with RVR ([place]) 05/10/2016  . Psoriasis (a type of skin inflammation)   . Vertigo   . Vitamin D deficiency 08/31/2016   Past Surgical History:  Procedure Laterality Date  . BACK SURGERY    . brain shun    . CHOLECYSTECTOMY    . ROTATOR CUFF  REPAIR    . SHOULDER SURGERY    . UPPER GI ENDOSCOPY  12/19/2015   [facility], Include esophagus, stomach & duodenum or / Jejunum: Dx w/wo biopsys and dilatation; Surgeon : Leonia ReevesAlbert John Rhoton MD    Allergies  Allergen Reactions  . Azithromycin Other (See Comments)    unknown  . Cephalexin Other (See Comments)    unknown  . Cephalosporins   . Ciprofloxacin Other (See Comments)    unknown  . Darvon [Propoxyphene]   . Flagyl [Metronidazole] Other (See Comments)    unknown  . Librax  [Chlordiazepoxide-Clidinium] Other (See Comments)    unknown  . Macrodantin [Nitrofurantoin Macrocrystal]   . Nitrofuran Derivatives Other (See Comments)    unknown  . Other Other (See Comments)    unknown  . Oxycodone-Acetaminophen Other (See Comments)    unknown  . Percocet [Oxycodone-Acetaminophen]   . Quetiapine Other (See Comments)    unknown Potassium level dropped.  . Sulfa Antibiotics   . Sulfamethoxazole Other (See Comments)    unknown    Outpatient Encounter Medications as of 08/24/2018  Medication Sig  . acetaminophen (TYLENOL) 325 MG tablet Take 650 mg by mouth every 8 (eight) hours as needed.   Marland Kitchen. acetaminophen (TYLENOL) 500 MG tablet Take 500 mg by mouth 2 (two) times daily.  . cetirizine (ZYRTEC) 10 MG tablet Take 10 mg by mouth at bedtime.  . Cholecalciferol (D3-1000) 1000 units tablet Take 1,000 Units by mouth daily.   . clonazePAM (KLONOPIN) 0.5 MG tablet Take 1 tablet (0.5 mg total) by mouth daily.  . clonazePAM (KLONOPIN) 1 MG tablet Give 1 mg by mouth at bedtime  . Dextromethorphan-guaiFENesin (ROBAFEN DM) 10-100 MG/5ML liquid Take 20 mLs by mouth 4 (four) times daily.  Marland Kitchen. donepezil (ARICEPT) 10 MG tablet Take 10 mg by mouth at bedtime.  . famotidine (PEPCID) 20 MG tablet Take 20 mg by mouth at bedtime.   . fluticasone (FLONASE) 50 MCG/ACT nasal spray Place 2 sprays into both nostrils daily as needed for allergies.   Marland Kitchen. guaiFENesin (ROBITUSSIN) 100 MG/5ML liquid Take 400 mg by mouth every 6 (six) hours as needed for cough.   Marland Kitchen. guaiFENesin (ROBITUSSIN) 100 MG/5ML liquid Give Guiafenesin 10cc p.o. every 4 hours prn for cough  . HYDROcodone-acetaminophen (NORCO/VICODIN) 5-325 MG tablet Take 1 tablet by mouth every 8 (eight) hours.  Marland Kitchen. levothyroxine (SYNTHROID, LEVOTHROID) 100 MCG tablet Take 100 mcg by mouth daily before breakfast.  . oxybutynin (DITROPAN) 5 MG tablet Take 5 mg by mouth 3 (three) times daily.  . pantoprazole (PROTONIX) 40 MG tablet Take 40 mg by mouth  2 (two) times daily.   Marland Kitchen. rOPINIRole (REQUIP) 0.25 MG tablet Take 0.5 mg by mouth 2 (two) times a day. Take 2 tablets (0.5 mg)  . Skin Protectants, Misc. (EUCERIN) cream Apply 1 application topically daily. Apply to face  . theophylline (THEODUR) 100 MG 12 hr tablet Take 100 mg by mouth 2 (two) times daily. COPD  . venlafaxine XR (EFFEXOR-XR) 75 MG 24 hr capsule take 1 tab po daily for depression   No facility-administered encounter medications on file as of 08/24/2018.     Review of Systems   In general she does not complain of any fever or chills.  Skin does not complain of rashes or itching.  Head ears eyes nose mouth and throat does not complain of visual changes or sore throat.  Respiratory is not complaining of shortness of breath or nursing staff has somewhat of a chronic  dry cough at times which the guaifenesin appears to help with  Cardiac is not complaining of chest pain or edema.  GI does not complain of abdominal discomfort nausea vomiting diarrhea constipation.  GU does have a history of overactive bladder but does not really complain of dysuria today.  Musculoskeletal does not complain of joint pain currently she does have history of somewhat chronic pain.  Neurologic she is not complaining of dizziness headache or numbness.  And psych does have a history of some anxiety as well as depression as noted above she is on Klonopin as well as Effexor        Immunization History  Administered Date(s) Administered  . Influenza, High Dose Seasonal PF 12/20/2014  . Influenza, Seasonal, Injecte, Preservative Fre 10/10/2014  . Influenza-Unspecified 12/07/2015, 11/21/2016, 11/19/2017  . PPD Test 02/10/2016, 02/26/2016  . Pneumococcal Conjugate-13 12/20/2014  . Pneumococcal Polysaccharide-23 02/07/2017  . Td 08/26/2014   Pertinent  Health Maintenance Due  Topic Date Due  . INFLUENZA VACCINE  09/12/2018  . DEXA SCAN  Completed  . PNA vac Low Risk Adult  Completed    Fall Risk  07/31/2017 07/31/2016  Falls in the past year? No Yes  Number falls in past yr: - 2 or more  Injury with Fall? - No   Functional Status Survey:    Vitals:   08/24/18 1059  BP: 101/66  Pulse: 61  Resp: 20  Temp: 98.8 F (37.1 C)  TempSrc: Oral  SpO2: 97%  Weight: 142 lb 9.6 oz (64.7 kg)  Height: 5\' 3"  (1.6 m)   Body mass index is 25.26 kg/m. Physical Exam   General this is a pleasant elderly female no distress.  Her skin is warm and dry.  Eyes visual acuity appears to be intact sclera and conjunctive are clear.  Oropharynx is clear mucous membranes moist.  Chest is clear to auscultation with somewhat shallow air entry there is no labored breathing.  Heart is regular rate and rhythm gallop or rub she does not really have significant lower extremity edema.  Her abdomen is soft nontender with positive bowel sounds.  Musculoskeletal Limited exam since she is sitting in a chair but is able to move all extremities x4 it appears at baseline strength appears to be relatively appropriate for age.  Neurologic is grossly intact her speech is clear could not appreciate lateralizing findings.  Psych she is pleasant and appropriate says she is still somewhat frustrated by the coronavirus restrictions-.    Labs reviewed: Recent Labs    12/22/17 01/27/18  NA 142 143  K 3.9 4.1  BUN 15 12  CREATININE 0.6 0.6   Recent Labs    12/22/17  AST 13  ALT 8  ALKPHOS 88   Recent Labs    12/22/17 01/27/18  WBC 5.4 6.3  HGB 11.0* 10.7*  HCT 31* 31*  PLT 236 408*   Lab Results  Component Value Date   TSH 3.88 12/22/2017   Lab Results  Component Value Date   HGBA1C 5.4 12/22/2017   Lab Results  Component Value Date   CHOL 185 12/22/2017   HDL 34 (A) 12/22/2017   LDLCALC 110 12/22/2017   TRIG 203 (A) 12/22/2017    Significant Diagnostic Results in last 30 days:  No results found.  Assessment/Plan  #1 COPD at this point appears to be stable she  continues on theophylline 100 mg twice daily she also has orders for Robitussin- at this point continue to monitor she does  have some history of pneumonia in the past but does not really exhibit symptoms today.  2.  Hypertension this appears stable as noted above she is not on any medications.--We will update BMP for updated values  3 history of afib--l this is rate controlled on no medications per cardiology.  4.  History of depression she continues on Effexor which was recently increased this appears relatively stable she is still somewhat frustrated with coronavirus restrictions but does not appear to be significantly depressed today.  5.  History of GERD she continues on Protonix 40 mg twice daily as well as Pepcid.  6 history of hypothyroidism she is on Synthroid hydrocodone micrograms daily TSH in November 2019 was 3.88.--We will update this  7 history of dementia she appears to be doing well with supportive care her weight is stable she is on Aricept 10 mg a day she also has orders for Klonopin for anxiety.  8.  History of restless legs she is on Requip 0.5 mg twice daily.  9.  History of chronic pain she does have orders for Norco 5-3 25 every 8 hours as needed she also has Tylenol.--Is not complaining of pain today  10 history of overactive bladder she is on Ditropan 5 mg 3 times daily she does not really complain of symptoms today.  11.  History of allergic rhinitis she continues on Zyrtec daily.  #12 history of anemia the last hemoglobin  was 10.7 in December 2019 will have this updated    [id number]   Addendum of note it appears  For  recent pneumonia she was actually treated with Levaquin-NOT Rocephin --Rocephin was listed in the original dictation but this has been corrected above

## 2018-08-25 ENCOUNTER — Other Ambulatory Visit: Payer: Self-pay | Admitting: Internal Medicine

## 2018-08-25 DIAGNOSIS — E039 Hypothyroidism, unspecified: Secondary | ICD-10-CM | POA: Diagnosis not present

## 2018-08-25 DIAGNOSIS — I10 Essential (primary) hypertension: Secondary | ICD-10-CM | POA: Diagnosis not present

## 2018-08-25 DIAGNOSIS — D649 Anemia, unspecified: Secondary | ICD-10-CM | POA: Diagnosis not present

## 2018-08-25 MED ORDER — HYDROCODONE-ACETAMINOPHEN 5-325 MG PO TABS: 1.0000 | ORAL_TABLET | Freq: Three times a day (TID) | ORAL | 0 refills | Status: DC

## 2018-08-25 MED ORDER — CLONAZEPAM 1 MG PO TABS: ORAL_TABLET | ORAL | 2 refills | Status: DC

## 2018-08-27 ENCOUNTER — Non-Acute Institutional Stay (SKILLED_NURSING_FACILITY): Payer: Medicare Other | Admitting: Internal Medicine

## 2018-08-27 ENCOUNTER — Encounter: Payer: Self-pay | Admitting: Internal Medicine

## 2018-08-27 DIAGNOSIS — R509 Fever, unspecified: Secondary | ICD-10-CM

## 2018-08-27 DIAGNOSIS — J449 Chronic obstructive pulmonary disease, unspecified: Secondary | ICD-10-CM

## 2018-08-27 DIAGNOSIS — J069 Acute upper respiratory infection, unspecified: Secondary | ICD-10-CM | POA: Diagnosis not present

## 2018-08-27 DIAGNOSIS — R05 Cough: Secondary | ICD-10-CM | POA: Diagnosis not present

## 2018-08-27 DIAGNOSIS — R9389 Abnormal findings on diagnostic imaging of other specified body structures: Secondary | ICD-10-CM | POA: Diagnosis not present

## 2018-08-27 NOTE — Progress Notes (Signed)
Location:  [facility] Room Number: 213-W Place of Service:  SNF [phone]) Provider:  Granville Lewis, PA-C  Rachel Duos, MD  Patient Care Team: Rachel Duos, MD as PCP - General (Internal Medicine)  Extended Emergency Contact Information Primary Emergency Contact: Jerral Bonito [phone] Johnnette Litter of [place] Phone: [phone] Relation: Niece Secondary Emergency Contact: Karen Chafe States of [place] Phone: [phone] Relation: Niece  Code Status:  DNR Goals of care: Advanced Directive information Advanced Directives 08/24/2018  Does Patient Have a Medical Advance Directive? Yes  Type of Advance Directive Out of facility DNR (pink MOST or yellow form)  Does patient want to make changes to medical advance directive? No - Patient declined  Copy of [place] in Chart? -  Pre-existing out of facility DNR order (yellow form or pink MOST form) Yellow form placed in chart (order not valid for inpatient use)     Chief Complaint  Patient presents with  . Acute Visit    Patient is seen for fever and pneumonia    HPI:  Pt is an 78 y.o. female seen today for an acute visit for follow-up of low-grade fever of 100.3 as well as a chest x-ray which shows a subtle right lower lobe infiltrate   patient is a long-term resident of facility with a history of COPD she is on theophylline well as Robafen four times a day...  Other diagnoses include hypertension atrial fibrillation depression hypothyroidism dementia chronic pain and overactive bladder  Apparently this morning she was noted to have a low-grade fever of 100.3- vital signs were taken which showed a systolic in the high 51O and borderline tachycardia at around 100 O2 saturation was in the low 90s on room air.  She appeared to not be feeling well a bit more lethargic.  She did not really have any overt congestion on exam or labored  breathing was having a dry cough  Chest x-ray was ordered which does come back showing a subtle right lower lobe infiltrate-previous infiltrate the left lobe apparently has resolved  Patient is a poor historian because of some dementia but she is not really complaining of shortness of breath it appears or discomfort  Past Medical History:  Diagnosis Date  . Acute encephalopathy 02/13/2016  . Anemia   . Anxiety   . Arthritis   . Asthma   . Barrett's esophagus   . Chronic kidney disease   . COPD (chronic obstructive pulmonary disease) ([place])   . Dementia ([place])   . Dementia with behavioral disturbance ([place]) 02/13/2016  . Depression   . Disease of thyroid gland   . Diverticulitis   . GERD (gastroesophageal reflux disease)   . Hyperlipemia   . Hypertension   . Memory difficulties   . Osteoporosis   . Pancreatitis   . Paroxysmal atrial fibrillation with RVR ([place]) 05/10/2016  . Psoriasis (a type of skin inflammation)   . Vertigo   . Vitamin D deficiency 08/31/2016   Past Surgical History:  Procedure Laterality Date  . BACK SURGERY    . brain shun    . CHOLECYSTECTOMY    . ROTATOR CUFF REPAIR    . SHOULDER SURGERY    . UPPER GI ENDOSCOPY  12/19/2015   [facility], Include esophagus, stomach & duodenum or / Jejunum: Dx w/wo biopsys and dilatation; Surgeon : Bing Quarry MD    Allergies  Allergen Reactions  . Azithromycin Other (See Comments)    unknown  . Cephalexin Other (See Comments)    unknown  . Cephalosporins   . Ciprofloxacin Other (See Comments)    unknown  . Darvon [Propoxyphene]   . Flagyl [Metronidazole] Other (See Comments)    unknown  . Librax [Chlordiazepoxide-Clidinium] Other (See Comments)    unknown  . Macrodantin [Nitrofurantoin Macrocrystal]   . Nitrofuran Derivatives Other (See Comments)    unknown  . Other Other (See Comments)    unknown  . Oxycodone-Acetaminophen Other (See Comments)    unknown  . Percocet [Oxycodone-Acetaminophen]   .  Quetiapine Other (See Comments)    unknown Potassium level dropped.  . Sulfa Antibiotics   . Sulfamethoxazole Other (See Comments)    unknown    Outpatient Encounter Medications as of 08/27/2018  Medication Sig  . acetaminophen (TYLENOL) 325 MG tablet Take 650 mg by mouth every 8 (eight) hours as needed.   Marland Kitchen. acetaminophen (TYLENOL) 500 MG tablet Take 500 mg by mouth 2 (two) times daily.  . cetirizine (ZYRTEC) 10 MG tablet Take 10 mg by mouth at bedtime.  . Cholecalciferol (D3-1000) 1000 units tablet Take 1,000 Units by mouth daily.   . clonazePAM (KLONOPIN) 0.5 MG tablet Take 1 tablet (0.5 mg total) by mouth daily.  . clonazePAM (KLONOPIN) 1 MG tablet Give 1 mg by mouth at bedtime  . Dextromethorphan-guaiFENesin (ROBAFEN DM) 10-100 MG/5ML liquid Take 20 mLs by mouth 4 (four) times daily.  Marland Kitchen. donepezil (ARICEPT) 10 MG tablet Take 10 mg by mouth at bedtime.  . famotidine (PEPCID) 20 MG tablet Take 20 mg by mouth at bedtime.   . fluticasone (FLONASE) 50 MCG/ACT nasal spray Place 2 sprays into both nostrils daily as needed for allergies.   Marland Kitchen. guaiFENesin (ROBITUSSIN) 100 MG/5ML liquid Take 400 mg by mouth every 6 (six) hours as needed for cough.   Marland Kitchen. guaiFENesin (ROBITUSSIN) 100 MG/5ML liquid Give Guiafenesin 10cc p.o. every 4 hours prn for cough  . HYDROcodone-acetaminophen (NORCO/VICODIN) 5-325 MG tablet Take 1 tablet by mouth every 8 (eight) hours.  Marland Kitchen. levothyroxine (SYNTHROID, LEVOTHROID) 100 MCG tablet Take 100 mcg by mouth daily before breakfast.  . oxybutynin (DITROPAN) 5 MG tablet Take 5 mg by mouth 3 (three) times daily.  . pantoprazole (PROTONIX) 40 MG tablet Take 40 mg by mouth 2 (two) times daily.   Marland Kitchen. rOPINIRole (REQUIP) 0.25 MG tablet Take 0.5 mg by mouth 2 (two) times a day. Take 2 tablets (0.5 mg)  . Skin Protectants, Misc. (EUCERIN) cream Apply 1 application topically daily. Apply to face  . theophylline (THEODUR) 100 MG 12 hr tablet Take 100 mg by mouth 2 (two) times daily. COPD   . venlafaxine XR (EFFEXOR-XR) 75 MG 24 hr capsule take 1 tab po daily for depression   No facility-administered encounter medications on file as of 08/27/2018.     Review of Systems   This is somewhat limited secondary to dementia and patient not talking a whole lot.  In general did have a low-grade fever  Eyes does not complain of visual changes.  Oropharynx is not complaining of a sore throat.  Resp--has been noted to have a dry cough is not really complaining of shortness of breath  Cardiac is not complaining of chest pain  GI no nausea vomiting or abdominal pain has been noted  GU does not really complain of dysuria.  Musculoskeletal has weakness but does not really complain of pain.  Neurologic is not  complaining of dizziness or headache  Psych does have some element of dementia    Immunization History  Administered Date(s) Administered  . Influenza, High Dose Seasonal PF 12/20/2014  . Influenza, Seasonal, Injecte, Preservative Fre 10/10/2014  . Influenza-Unspecified 12/07/2015, 11/21/2016, 11/19/2017  . PPD Test 02/10/2016, 02/26/2016  . Pneumococcal Conjugate-13 12/20/2014  . Pneumococcal Polysaccharide-23 02/07/2017  . Td 08/26/2014   Pertinent  Health Maintenance Due  Topic Date Due  . INFLUENZA VACCINE  09/12/2018  . DEXA SCAN  Completed  . PNA vac Low Risk Adult  Completed   Fall Risk  07/31/2017 07/31/2016  Falls in the past year? No Yes  Number falls in past yr: - 2 or more  Injury with Fall? - No   Functional Status Survey:      Temperature is 100.3 blood pressure 99/56- pulse of 100 oxygen saturation 92% on room air Body mass index is 25.26 kg/m. Physical Exam   General this is a pleasant elderly female she appears a bit more lethargic than her baseline she is lying in bed there is no sign of distress--she is alert and responsive just appears a bit weaker than her baseline  Skin is warm and dry possibly a bit warmer than usual  Eyes  visual acuity appears to be intact sclera and conjunctive are clear.  Oropharynx is clear mucous membranes appear fairly moist.  Chest is actually clear to auscultation there is somewhat poor respiratory effort especially when listening to the posterior lung fields but could not really appreciate overt congestion there is no labored breathing  Heart is borderline cardiac rate of about 98 she has minimal lower extremity edema   Abdomen is soft nontender with positive bowel sounds.  Musculoskeletal Limited exam since she is in bed but appears able to move her extremities x4 did attempt to turn in bed when I asked to listen to her posterior lung fields.  Neurologic is grossly intact her speech is clear she is alert.  Psych she is pleasant and appropriate but appears weaker than when I saw her earlier this week     Labs reviewed:  August 25, 2018.  WBC 6.0 hemoglobin 11.5 platelets 142  Sodium 142 potassium 3.2 BUN 7.1 creatinine 0.67   Recent Labs    12/22/17 01/27/18  NA 142 143  K 3.9 4.1  BUN 15 12  CREATININE 0.6 0.6   Recent Labs    12/22/17  AST 13  ALT 8  ALKPHOS 88   Recent Labs    12/22/17 01/27/18  WBC 5.4 6.3  HGB 11.0* 10.7*  HCT 31* 31*  PLT 236 408*   Lab Results  Component Value Date   TSH 3.88 12/22/2017   Lab Results  Component Value Date   HGBA1C 5.4 12/22/2017   Lab Results  Component Value Date   CHOL 185 12/22/2017   HDL 34 (A) 12/22/2017   LDLCALC 110 12/22/2017   TRIG 203 (A) 12/22/2017    Significant Diagnostic Results in last 30 days:  No results found.  Assessment/Plan  #1 fever of unknown origin-at this point one would suspect respiratory did order a chest x-ray which did show a subtle right lower lobe infiltrate.  She is previously been treated with Levaquin will restart this at 500 mg daily for 7 days.  We will continue the Robafen routine 4 times daily  Also with concerns for the somewhat lower blood pressure he is  getting a bit dry will start on IV one half normal  saline at 75 cc an hour for 2 L and will obtain lab work in the morning including a BMP as well as a CBC. We have noted her potassium was somewhat low at 3.2 on lab done on July 14 and this was supplemented with 40 mEq of potassium--we will continue on 20 mEq of potassium for now  We have also obtain a COVID-19 test. Ordered a urinalysis and culture as well  Empirically will start her on zinc 220 mg daily for 14 days- as well as vitamin C at thousand milligrams for 14 days-she is already on vitamin D-also will start aspirin 81 mg a day for 14 days   --- This plan was discussed with Dr. Lyn HollingsheadAlexander via phone.  [id number]-of note greater than 45 minutes spent assessing patient--reviewing her chart and labs-discussing her status with nursing staff as well as with Dr.[name] via phone--coordinating a plan of care- of note greater than 50% of time spent coordinating a plan of care  With  input as noted above

## 2018-08-28 ENCOUNTER — Non-Acute Institutional Stay (SKILLED_NURSING_FACILITY): Payer: Medicare Other | Admitting: Internal Medicine

## 2018-08-28 DIAGNOSIS — E860 Dehydration: Secondary | ICD-10-CM | POA: Diagnosis not present

## 2018-08-28 DIAGNOSIS — J181 Lobar pneumonia, unspecified organism: Secondary | ICD-10-CM

## 2018-08-28 DIAGNOSIS — D649 Anemia, unspecified: Secondary | ICD-10-CM | POA: Diagnosis not present

## 2018-08-28 DIAGNOSIS — U071 COVID-19: Secondary | ICD-10-CM

## 2018-08-28 DIAGNOSIS — J189 Pneumonia, unspecified organism: Secondary | ICD-10-CM

## 2018-08-28 DIAGNOSIS — I10 Essential (primary) hypertension: Secondary | ICD-10-CM | POA: Diagnosis not present

## 2018-08-29 DIAGNOSIS — R319 Hematuria, unspecified: Secondary | ICD-10-CM | POA: Diagnosis not present

## 2018-08-29 DIAGNOSIS — N390 Urinary tract infection, site not specified: Secondary | ICD-10-CM | POA: Diagnosis not present

## 2018-08-29 DIAGNOSIS — I10 Essential (primary) hypertension: Secondary | ICD-10-CM | POA: Diagnosis not present

## 2018-08-29 DIAGNOSIS — Z79899 Other long term (current) drug therapy: Secondary | ICD-10-CM | POA: Diagnosis not present

## 2018-08-30 ENCOUNTER — Encounter: Payer: Self-pay | Admitting: Internal Medicine

## 2018-08-30 DIAGNOSIS — E860 Dehydration: Secondary | ICD-10-CM | POA: Insufficient documentation

## 2018-08-30 DIAGNOSIS — U071 COVID-19: Secondary | ICD-10-CM | POA: Insufficient documentation

## 2018-08-30 NOTE — Progress Notes (Signed)
Location:  [place] of Service:   SNF  Hennie Duos, MD  Patient Care Team: Hennie Duos, MD as PCP - General (Internal Medicine)  Extended Emergency Contact Information Primary Emergency Contact: Jerral Bonito [phone] Johnnette Litter of [place] Phone: [phone] Relation: Niece Secondary Emergency Contact: Karen Chafe States of [place] Phone: [phone] Relation: Niece    Allergies: Azithromycin, Cephalexin, Cephalosporins, Ciprofloxacin, Darvon [propoxyphene], Flagyl [metronidazole], Librax [chlordiazepoxide-clidinium], Macrodantin [nitrofurantoin macrocrystal], Nitrofuran derivatives, Other, Oxycodone-acetaminophen, Percocet [oxycodone-acetaminophen], Quetiapine, Sulfa antibiotics, and Sulfamethoxazole  Chief Complaint  Patient presents with  . Acute Visit    HPI: Patient is 78 y.o. female who who is being seen for pneumonia.  First contact is per phone with chest x-ray positive for right lower lobe infiltrate.  Patient with extensive allergies was started on Levaquin and IV fluids.  Today patient is sitting up with lunch she looks weak but has not needed oxygen.  Denies chest pain or shortness of breath.  Past Medical History:  Diagnosis Date  . Acute encephalopathy 02/13/2016  . Anemia   . Anxiety   . Arthritis   . Asthma   . Barrett's esophagus   . Chronic kidney disease   . COPD (chronic obstructive pulmonary disease) ([place])   . Dementia ([place])   . Dementia with behavioral disturbance ([place]) 02/13/2016  . Depression   . Disease of thyroid gland   . Diverticulitis   . GERD (gastroesophageal reflux disease)   . Hyperlipemia   . Hypertension   . Memory difficulties   . Osteoporosis   . Pancreatitis   . Paroxysmal atrial fibrillation with RVR ([place]) 05/10/2016  . Psoriasis (a type of skin inflammation)   . Vertigo   . Vitamin D deficiency 08/31/2016    Past Surgical History:   Procedure Laterality Date  . BACK SURGERY    . brain shun    . CHOLECYSTECTOMY    . ROTATOR CUFF REPAIR    . SHOULDER SURGERY    . UPPER GI ENDOSCOPY  12/19/2015   [facility], Include esophagus, stomach & duodenum or / Jejunum: Dx w/wo biopsys and dilatation; Surgeon : Cristie Hem Rhoton MD    Allergies as of 08/28/2018      Reactions   Azithromycin Other (See Comments)   unknown   Cephalexin Other (See Comments)   unknown   Cephalosporins    Ciprofloxacin Other (See Comments)   unknown   Darvon [propoxyphene]    Flagyl [metronidazole] Other (See Comments)   unknown   Librax [chlordiazepoxide-clidinium] Other (See Comments)   unknown   Macrodantin [nitrofurantoin Macrocrystal]    Nitrofuran Derivatives Other (See Comments)   unknown   Other Other (See Comments)   unknown   Oxycodone-acetaminophen Other (See Comments)   unknown   Percocet [oxycodone-acetaminophen]    Quetiapine Other (See Comments)   unknown Potassium level dropped.   Sulfa Antibiotics    Sulfamethoxazole Other (See Comments)   unknown      Medication List       Accurate as of August 28, 2018 11:59 PM. If you have any questions, ask your nurse or doctor.        acetaminophen 500 MG tablet Commonly known as: TYLENOL Take 500 mg by mouth 2 (two) times daily.   acetaminophen 325 MG tablet Commonly known as: TYLENOL Take 650 mg by mouth every 8 (eight) hours as needed.   cetirizine  10 MG tablet Commonly known as: ZYRTEC Take 10 mg by mouth at bedtime.   clonazePAM 0.5 MG tablet Commonly known as: KLONOPIN Take 1 tablet (0.5 mg total) by mouth daily.   clonazePAM 1 MG tablet Commonly known as: KLONOPIN Give 1 mg by mouth at bedtime   D3-1000 25 MCG (1000 UT) tablet Generic drug: Cholecalciferol Take 1,000 Units by mouth daily.   donepezil 10 MG tablet Commonly known as: ARICEPT Take 10 mg by mouth at bedtime.   eucerin cream Apply 1 application topically daily. Apply to face    famotidine 20 MG tablet Commonly known as: PEPCID Take 20 mg by mouth at bedtime.   fluticasone 50 MCG/ACT nasal spray Commonly known as: FLONASE Place 2 sprays into both nostrils daily as needed for allergies.   guaiFENesin 100 MG/5ML liquid Commonly known as: ROBITUSSIN Take 400 mg by mouth every 6 (six) hours as needed for cough.   guaiFENesin 100 MG/5ML liquid Commonly known as: ROBITUSSIN Give Guiafenesin 10cc p.o. every 4 hours prn for cough   HYDROcodone-acetaminophen 5-325 MG tablet Commonly known as: NORCO/VICODIN Take 1 tablet by mouth every 8 (eight) hours.   levothyroxine 100 MCG tablet Commonly known as: SYNTHROID Take 100 mcg by mouth daily before breakfast.   oxybutynin 5 MG tablet Commonly known as: DITROPAN Take 5 mg by mouth 3 (three) times daily.   pantoprazole 40 MG tablet Commonly known as: PROTONIX Take 40 mg by mouth 2 (two) times daily.   Robafen DM 10-100 MG/5ML liquid Generic drug: Dextromethorphan-guaiFENesin Take 20 mLs by mouth 4 (four) times daily.   rOPINIRole 0.25 MG tablet Commonly known as: REQUIP Take 0.5 mg by mouth 2 (two) times a day. Take 2 tablets (0.5 mg)   theophylline 100 MG 12 hr tablet Commonly known as: THEODUR Take 100 mg by mouth 2 (two) times daily. COPD   venlafaxine XR 75 MG 24 hr capsule Commonly known as: EFFEXOR-XR take 1 tab po daily for depression       No orders of the defined types were placed in this encounter.   Immunization History  Administered Date(s) Administered  . Influenza, High Dose Seasonal PF 12/20/2014  . Influenza, Seasonal, Injecte, Preservative Fre 10/10/2014  . Influenza-Unspecified 12/07/2015, 11/21/2016, 11/19/2017  . PPD Test 02/10/2016, 02/26/2016  . Pneumococcal Conjugate-13 12/20/2014  . Pneumococcal Polysaccharide-23 02/07/2017  . Td 08/26/2014    Social History   Tobacco Use  . Smoking status: Former Smoker    Quit date: 12/17/1990    Years since quitting: 27.7  .  Smokeless tobacco: Never Used  Substance Use Topics  . Alcohol use: No    Review of Systems  DATA OBTAINED: from patient, nurse GENERAL: Low-grade fevers,+ fatigue, appetite changes SKIN: No itching, rash HEENT: No complaint RESPIRATORY: + cough, no wheezing, SOB CARDIAC: No chest pain, palpitations, lower extremity edema  GI: No abdominal pain, No N/V/D or constipation, No heartburn or reflux  GU: No dysuria, frequency or urgency, or incontinence  MUSCULOSKELETAL: No unrelieved bone/joint pain NEUROLOGIC: No headache, dizziness  PSYCHIATRIC: No overt anxiety or sadness  Vitals:   08/30/18 2223  BP: 123/71  Pulse: 98  Resp: 18  Temp: 99 F (37.2 C)   There is no height or weight on file to calculate BMI. Physical Exam  GENERAL APPEARANCE: Alert, conversant, appears weak, but not toxic is able to sit up for lunch with help SKIN: No diaphoresis rash HEENT: Unremarkable RESPIRATORY: Breathing is even, unlabored. Lung sounds are clear  CARDIOVASCULAR: Heart regular, a little rapid, no murmurs, rubs or gallops. No peripheral edema  GASTROINTESTINAL: Abdomen is soft, non-tender, not distended w/ normal bowel sounds.  GENITOURINARY: Bladder non tender, not distended  MUSCULOSKELETAL: No abnormal joints or musculature NEUROLOGIC: Cranial nerves 2-12 grossly intact. Moves all extremities PSYCHIATRIC: Mood and affect appropriate to situation, no behavioral issues  Patient Active Problem List   Diagnosis Date Noted  . Bilateral pneumonia 07/26/2018  . Rhonchi 07/26/2018  . Xerosis cutis 07/26/2018  . Restless leg syndrome 05/19/2018  . Chronic generalized pain 05/19/2018  . Hypotension 08/31/2016  . Postural dizziness with presyncope 08/31/2016  . Chest pain 08/31/2016  . Paroxysmal atrial fibrillation ([facility]) 08/31/2016  . Hypokalemia 08/31/2016  . Depression 08/31/2016  . Vitamin D deficiency 08/31/2016  . Mood disorder ([facility]) 08/22/2016  . PNA (pneumonia) 05/10/2016  .  COPD exacerbation ([facility]) 05/10/2016  . UTI (urinary tract infection) 05/10/2016  . Paroxysmal atrial fibrillation with RVR ([facility]) 05/10/2016  . Metabolic encephalopathy 05/10/2016  . Elevated amylase and lipase 02/13/2016  . Acute encephalopathy 02/13/2016  . 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 Dementia with behavioral disturbance ([facility]) 02/13/2016  . Overactive bladder 02/13/2016  . Chronic kidney disease   . COPD (chronic obstructive pulmonary disease) ([facility])   . Anemia   . Anxiety   . Barrett's esophagus   . Hypothyroidism   . GERD (gastroesophageal reflux disease)   . Hypertension   . Memory difficulties   . Osteoporosis   . Psoriasis (a type of skin inflammation)     CMP     Component Value Date/Time   NA 143 01/27/2018   NA 143 03/18/2017   K 4.1 01/27/2018   K 3.8 03/18/2017   CL 100 (L) 01/26/2016 1935   CO2 30 01/26/2016 1935   GLUCOSE 115 (H) 01/26/2016 1935   BUN 12 01/27/2018   CREATININE 0.6 01/27/2018   CREATININE 0.64 03/18/2017   CALCIUM 9.2 03/18/2017   ALBUMIN 3.8 03/18/2017   AST 13 12/22/2017   AST 9 03/18/2017   ALT 8 12/22/2017   ALT 7 03/18/2017   ALKPHOS 88 12/22/2017   ALKPHOS 99 03/18/2017   BILITOT 0.2 03/18/2017   GFRNONAA 85.41 03/18/2017   GFRAA >60 01/26/2016 1935   Recent Labs    12/22/17 01/27/18  NA 142 143  K 3.9 4.1  BUN 15 12  CREATININE 0.6 0.6   Recent Labs    12/22/17  AST 13  ALT 8  ALKPHOS 88   Recent Labs    12/22/17 01/27/18  WBC 5.4 6.3  HGB 11.0* 10.7*  HCT 31* 31*  PLT 236 408*   Recent Labs    12/22/17  CHOL 185  LDLCALC 110  TRIG 203*   [facility] Lab Results  Component Value Date   TSH 3.88 12/22/2017   Lab Results  Component Value Date   HGBA1C 5.4 12/22/2017   Lab Results  Component Value Date   CHOL 185 12/22/2017   HDL 34 (A) 12/22/2017   LDLCALC 110 12/22/2017   TRIG 203 (A) 12/22/2017    Significant Diagnostic Results in last 30 days:  No results  found.  Assessment and Plan  Right lower lobe pneumonia/dehydration/ COVID positive/isolation- continue with Levaquin, patient has been hep-locked in case there is need for more IV fluids, at the moment there is not; patient is not oxygen requiring therefore we will not be using steroids, patient is on Levaquin therefore cannot use Zithromax; will  continue to monitor closely; patient is on the "COVID cocktail" of vitamin C vitamin D, zinc, and aspirin; continue to monitor     Late entry- patient is test from the health department came back positive for COVID-19    Margit HanksAnne D Tiesha Marich, MD

## 2018-09-06 DIAGNOSIS — A419 Sepsis, unspecified organism: Secondary | ICD-10-CM | POA: Diagnosis not present

## 2018-09-06 DIAGNOSIS — A4102 Sepsis due to Methicillin resistant Staphylococcus aureus: Secondary | ICD-10-CM | POA: Diagnosis not present

## 2018-09-06 DIAGNOSIS — I482 Chronic atrial fibrillation, unspecified: Secondary | ICD-10-CM | POA: Diagnosis not present

## 2018-09-06 DIAGNOSIS — Z79891 Long term (current) use of opiate analgesic: Secondary | ICD-10-CM | POA: Diagnosis not present

## 2018-09-06 DIAGNOSIS — Z66 Do not resuscitate: Secondary | ICD-10-CM | POA: Diagnosis not present

## 2018-09-06 DIAGNOSIS — R6521 Severe sepsis with septic shock: Secondary | ICD-10-CM | POA: Diagnosis not present

## 2018-09-06 DIAGNOSIS — R0902 Hypoxemia: Secondary | ICD-10-CM | POA: Diagnosis not present

## 2018-09-06 DIAGNOSIS — Z982 Presence of cerebrospinal fluid drainage device: Secondary | ICD-10-CM | POA: Diagnosis not present

## 2018-09-06 DIAGNOSIS — I10 Essential (primary) hypertension: Secondary | ICD-10-CM | POA: Diagnosis not present

## 2018-09-06 DIAGNOSIS — N183 Chronic kidney disease, stage 3 (moderate): Secondary | ICD-10-CM | POA: Diagnosis not present

## 2018-09-06 DIAGNOSIS — Z743 Need for continuous supervision: Secondary | ICD-10-CM | POA: Diagnosis not present

## 2018-09-06 DIAGNOSIS — R05 Cough: Secondary | ICD-10-CM | POA: Diagnosis not present

## 2018-09-06 DIAGNOSIS — I959 Hypotension, unspecified: Secondary | ICD-10-CM | POA: Diagnosis not present

## 2018-09-06 DIAGNOSIS — R7881 Bacteremia: Secondary | ICD-10-CM | POA: Diagnosis not present

## 2018-09-06 DIAGNOSIS — K219 Gastro-esophageal reflux disease without esophagitis: Secondary | ICD-10-CM | POA: Diagnosis not present

## 2018-09-06 DIAGNOSIS — R531 Weakness: Secondary | ICD-10-CM | POA: Diagnosis not present

## 2018-09-06 DIAGNOSIS — U071 COVID-19: Secondary | ICD-10-CM | POA: Diagnosis not present

## 2018-09-06 DIAGNOSIS — R0602 Shortness of breath: Secondary | ICD-10-CM | POA: Diagnosis not present

## 2018-09-06 DIAGNOSIS — R918 Other nonspecific abnormal finding of lung field: Secondary | ICD-10-CM | POA: Diagnosis not present

## 2018-09-06 DIAGNOSIS — J449 Chronic obstructive pulmonary disease, unspecified: Secondary | ICD-10-CM | POA: Diagnosis not present

## 2018-09-06 DIAGNOSIS — A4902 Methicillin resistant Staphylococcus aureus infection, unspecified site: Secondary | ICD-10-CM | POA: Diagnosis not present

## 2018-09-06 DIAGNOSIS — E039 Hypothyroidism, unspecified: Secondary | ICD-10-CM | POA: Diagnosis not present

## 2018-09-06 DIAGNOSIS — I4891 Unspecified atrial fibrillation: Secondary | ICD-10-CM | POA: Diagnosis not present

## 2018-09-06 DIAGNOSIS — I480 Paroxysmal atrial fibrillation: Secondary | ICD-10-CM | POA: Diagnosis not present

## 2018-09-06 DIAGNOSIS — J440 Chronic obstructive pulmonary disease with acute lower respiratory infection: Secondary | ICD-10-CM | POA: Diagnosis not present

## 2018-09-06 DIAGNOSIS — J15211 Pneumonia due to Methicillin susceptible Staphylococcus aureus: Secondary | ICD-10-CM | POA: Diagnosis not present

## 2018-09-06 DIAGNOSIS — B9562 Methicillin resistant Staphylococcus aureus infection as the cause of diseases classified elsewhere: Secondary | ICD-10-CM | POA: Diagnosis not present

## 2018-09-06 DIAGNOSIS — R000 Tachycardia, unspecified: Secondary | ICD-10-CM | POA: Diagnosis not present

## 2018-09-06 DIAGNOSIS — R488 Other symbolic dysfunctions: Secondary | ICD-10-CM | POA: Diagnosis not present

## 2018-09-06 DIAGNOSIS — J850 Gangrene and necrosis of lung: Secondary | ICD-10-CM | POA: Diagnosis not present

## 2018-09-06 DIAGNOSIS — K210 Gastro-esophageal reflux disease with esophagitis: Secondary | ICD-10-CM | POA: Diagnosis not present

## 2018-09-06 DIAGNOSIS — R0689 Other abnormalities of breathing: Secondary | ICD-10-CM | POA: Diagnosis not present

## 2018-09-06 DIAGNOSIS — J181 Lobar pneumonia, unspecified organism: Secondary | ICD-10-CM | POA: Diagnosis not present

## 2018-09-06 DIAGNOSIS — R2681 Unsteadiness on feet: Secondary | ICD-10-CM | POA: Diagnosis not present

## 2018-09-06 DIAGNOSIS — M6281 Muscle weakness (generalized): Secondary | ICD-10-CM | POA: Diagnosis not present

## 2018-09-06 DIAGNOSIS — R279 Unspecified lack of coordination: Secondary | ICD-10-CM | POA: Diagnosis not present

## 2018-09-06 DIAGNOSIS — R1312 Dysphagia, oropharyngeal phase: Secondary | ICD-10-CM | POA: Diagnosis not present

## 2018-09-06 DIAGNOSIS — L89151 Pressure ulcer of sacral region, stage 1: Secondary | ICD-10-CM | POA: Diagnosis not present

## 2018-09-06 DIAGNOSIS — D649 Anemia, unspecified: Secondary | ICD-10-CM | POA: Diagnosis not present

## 2018-09-06 DIAGNOSIS — R079 Chest pain, unspecified: Secondary | ICD-10-CM | POA: Diagnosis not present

## 2018-09-06 DIAGNOSIS — R509 Fever, unspecified: Secondary | ICD-10-CM | POA: Diagnosis not present

## 2018-09-06 DIAGNOSIS — I129 Hypertensive chronic kidney disease with stage 1 through stage 4 chronic kidney disease, or unspecified chronic kidney disease: Secondary | ICD-10-CM | POA: Diagnosis not present

## 2018-09-06 DIAGNOSIS — R9431 Abnormal electrocardiogram [ECG] [EKG]: Secondary | ICD-10-CM | POA: Diagnosis not present

## 2018-09-06 DIAGNOSIS — E785 Hyperlipidemia, unspecified: Secondary | ICD-10-CM | POA: Diagnosis not present

## 2018-09-06 DIAGNOSIS — N189 Chronic kidney disease, unspecified: Secondary | ICD-10-CM | POA: Diagnosis not present

## 2018-09-06 DIAGNOSIS — J189 Pneumonia, unspecified organism: Secondary | ICD-10-CM | POA: Diagnosis not present

## 2018-09-06 DIAGNOSIS — J45909 Unspecified asthma, uncomplicated: Secondary | ICD-10-CM | POA: Diagnosis not present

## 2018-09-06 DIAGNOSIS — J159 Unspecified bacterial pneumonia: Secondary | ICD-10-CM | POA: Diagnosis not present

## 2018-09-06 DIAGNOSIS — R55 Syncope and collapse: Secondary | ICD-10-CM | POA: Diagnosis not present

## 2018-09-06 DIAGNOSIS — I498 Other specified cardiac arrhythmias: Secondary | ICD-10-CM | POA: Diagnosis not present

## 2018-09-06 DIAGNOSIS — R6520 Severe sepsis without septic shock: Secondary | ICD-10-CM | POA: Diagnosis not present

## 2018-09-06 DIAGNOSIS — G301 Alzheimer's disease with late onset: Secondary | ICD-10-CM | POA: Diagnosis not present

## 2018-09-06 DIAGNOSIS — J9601 Acute respiratory failure with hypoxia: Secondary | ICD-10-CM | POA: Diagnosis not present

## 2018-09-06 DIAGNOSIS — Z9981 Dependence on supplemental oxygen: Secondary | ICD-10-CM | POA: Diagnosis not present

## 2018-09-06 LAB — CBC AND DIFFERENTIAL
HCT: 36 (ref 36–46)
Hemoglobin: 12.3 (ref 12.0–16.0)
Neutrophils Absolute: 25
Platelets: 508 — AB (ref 150–399)
WBC: 27.3

## 2018-09-06 LAB — BASIC METABOLIC PANEL
BUN: 16 (ref 4–21)
Creatinine: 0.8 (ref 0.5–1.1)
Glucose: 105
Potassium: 4 (ref 3.4–5.3)
Sodium: 138 (ref 137–147)

## 2018-09-07 MED ORDER — MUPIROCIN 2 % EX OINT
TOPICAL_OINTMENT | CUTANEOUS | Status: DC
Start: 2018-09-08 — End: 2018-09-07

## 2018-09-07 MED ORDER — GENERIC EXTERNAL MEDICATION
20.00 | Status: DC
Start: ? — End: 2018-09-07

## 2018-09-07 MED ORDER — GENERIC EXTERNAL MEDICATION
20.00 | Status: DC
Start: 2018-09-08 — End: 2018-09-07

## 2018-09-07 MED ORDER — PANTOPRAZOLE SODIUM 40 MG PO TBEC
40.00 | DELAYED_RELEASE_TABLET | ORAL | Status: DC
Start: 2018-09-09 — End: 2018-09-07

## 2018-09-07 MED ORDER — ENOXAPARIN SODIUM 60 MG/0.6ML ~~LOC~~ SOLN
1.00 | SUBCUTANEOUS | Status: DC
Start: 2018-09-08 — End: 2018-09-07

## 2018-09-07 MED ORDER — VANCOMYCIN HCL IN DEXTROSE 1-5 GM/200ML-% IV SOLN
15.00 | INTRAVENOUS | Status: DC
Start: 2018-09-09 — End: 2018-09-07

## 2018-09-07 MED ORDER — GENERIC EXTERNAL MEDICATION
Status: DC
Start: ? — End: 2018-09-07

## 2018-09-07 MED ORDER — GENERIC EXTERNAL MEDICATION
1.00 | Status: DC
Start: 2018-09-07 — End: 2018-09-07

## 2018-09-07 MED ORDER — GENERIC EXTERNAL MEDICATION
0.50 | Status: DC
Start: ? — End: 2018-09-07

## 2018-09-07 MED ORDER — BUDESONIDE-FORMOTEROL FUMARATE 160-4.5 MCG/ACT IN AERO
2.00 | INHALATION_SPRAY | RESPIRATORY_TRACT | Status: DC
Start: 2018-09-08 — End: 2018-09-07

## 2018-09-07 MED ORDER — ALTEPLASE 2 MG IJ SOLR
2.00 | INTRAMUSCULAR | Status: DC
Start: ? — End: 2018-09-07

## 2018-09-07 MED ORDER — IPRATROPIUM-ALBUTEROL 0.5-2.5 (3) MG/3ML IN SOLN
3.00 | RESPIRATORY_TRACT | Status: DC
Start: ? — End: 2018-09-07

## 2018-09-07 MED ORDER — DONEPEZIL HCL 10 MG PO TABS
10.00 | ORAL_TABLET | ORAL | Status: DC
Start: 2018-09-08 — End: 2018-09-07

## 2018-09-07 MED ORDER — VENLAFAXINE HCL ER 37.5 MG PO CP24
75.00 | ORAL_CAPSULE | ORAL | Status: DC
Start: 2018-09-09 — End: 2018-09-07

## 2018-09-07 MED ORDER — CLONAZEPAM 1 MG PO TABS
1.00 | ORAL_TABLET | ORAL | Status: DC
Start: ? — End: 2018-09-07

## 2018-09-07 MED ORDER — LEVOTHYROXINE SODIUM 50 MCG PO TABS
100.00 | ORAL_TABLET | ORAL | Status: DC
Start: 2018-09-09 — End: 2018-09-07

## 2018-09-08 MED ORDER — GENERIC EXTERNAL MEDICATION
1.00 | Status: DC
Start: 2018-09-08 — End: 2018-09-08

## 2018-09-08 MED ORDER — MELATONIN 3 MG PO TABS
6.00 | ORAL_TABLET | ORAL | Status: DC
Start: 2018-09-08 — End: 2018-09-08

## 2018-09-08 MED ORDER — GENERIC EXTERNAL MEDICATION
Status: DC
Start: ? — End: 2018-09-08

## 2018-09-15 LAB — HEPATIC FUNCTION PANEL
ALT: 8 (ref 7–35)
AST: 13 (ref 13–35)
Alkaline Phosphatase: 68 (ref 25–125)
Bilirubin, Total: 0.4

## 2018-09-15 LAB — CBC AND DIFFERENTIAL
HCT: 29 — AB (ref 36–46)
Hemoglobin: 9.8 — AB (ref 12.0–16.0)
Platelets: 534 — AB (ref 150–399)
WBC: 8.5

## 2018-09-15 LAB — BASIC METABOLIC PANEL
BUN: 14 (ref 4–21)
Creatinine: 0.8 (ref 0.5–1.1)
Glucose: 102
Potassium: 3.5 (ref 3.4–5.3)
Sodium: 138 (ref 137–147)

## 2018-09-16 ENCOUNTER — Encounter: Payer: Self-pay | Admitting: Internal Medicine

## 2018-09-16 ENCOUNTER — Non-Acute Institutional Stay (SKILLED_NURSING_FACILITY): Payer: Medicare Other | Admitting: Internal Medicine

## 2018-09-16 DIAGNOSIS — U071 COVID-19: Secondary | ICD-10-CM | POA: Diagnosis not present

## 2018-09-16 DIAGNOSIS — I480 Paroxysmal atrial fibrillation: Secondary | ICD-10-CM | POA: Diagnosis not present

## 2018-09-16 DIAGNOSIS — G301 Alzheimer's disease with late onset: Secondary | ICD-10-CM

## 2018-09-16 DIAGNOSIS — J850 Gangrene and necrosis of lung: Secondary | ICD-10-CM | POA: Diagnosis not present

## 2018-09-16 DIAGNOSIS — J189 Pneumonia, unspecified organism: Secondary | ICD-10-CM

## 2018-09-16 DIAGNOSIS — J181 Lobar pneumonia, unspecified organism: Secondary | ICD-10-CM

## 2018-09-16 DIAGNOSIS — I129 Hypertensive chronic kidney disease with stage 1 through stage 4 chronic kidney disease, or unspecified chronic kidney disease: Secondary | ICD-10-CM | POA: Diagnosis not present

## 2018-09-16 DIAGNOSIS — F0281 Dementia in other diseases classified elsewhere with behavioral disturbance: Secondary | ICD-10-CM

## 2018-09-16 DIAGNOSIS — R488 Other symbolic dysfunctions: Secondary | ICD-10-CM | POA: Diagnosis not present

## 2018-09-16 DIAGNOSIS — M6281 Muscle weakness (generalized): Secondary | ICD-10-CM | POA: Diagnosis not present

## 2018-09-16 DIAGNOSIS — R531 Weakness: Secondary | ICD-10-CM | POA: Diagnosis not present

## 2018-09-16 DIAGNOSIS — R1312 Dysphagia, oropharyngeal phase: Secondary | ICD-10-CM | POA: Diagnosis not present

## 2018-09-16 DIAGNOSIS — D649 Anemia, unspecified: Secondary | ICD-10-CM | POA: Diagnosis not present

## 2018-09-16 DIAGNOSIS — I4891 Unspecified atrial fibrillation: Secondary | ICD-10-CM | POA: Diagnosis not present

## 2018-09-16 DIAGNOSIS — K2100 Gastro-esophageal reflux disease with esophagitis, without bleeding: Secondary | ICD-10-CM

## 2018-09-16 DIAGNOSIS — E785 Hyperlipidemia, unspecified: Secondary | ICD-10-CM | POA: Diagnosis not present

## 2018-09-16 DIAGNOSIS — I959 Hypotension, unspecified: Secondary | ICD-10-CM | POA: Diagnosis not present

## 2018-09-16 DIAGNOSIS — R279 Unspecified lack of coordination: Secondary | ICD-10-CM | POA: Diagnosis not present

## 2018-09-16 DIAGNOSIS — I10 Essential (primary) hypertension: Secondary | ICD-10-CM | POA: Diagnosis not present

## 2018-09-16 DIAGNOSIS — R55 Syncope and collapse: Secondary | ICD-10-CM | POA: Diagnosis not present

## 2018-09-16 DIAGNOSIS — R2681 Unsteadiness on feet: Secondary | ICD-10-CM | POA: Diagnosis not present

## 2018-09-16 DIAGNOSIS — B9562 Methicillin resistant Staphylococcus aureus infection as the cause of diseases classified elsewhere: Secondary | ICD-10-CM | POA: Diagnosis not present

## 2018-09-16 DIAGNOSIS — E034 Atrophy of thyroid (acquired): Secondary | ICD-10-CM | POA: Diagnosis not present

## 2018-09-16 DIAGNOSIS — R7881 Bacteremia: Secondary | ICD-10-CM | POA: Diagnosis not present

## 2018-09-16 DIAGNOSIS — A4102 Sepsis due to Methicillin resistant Staphylococcus aureus: Secondary | ICD-10-CM | POA: Diagnosis not present

## 2018-09-16 DIAGNOSIS — E876 Hypokalemia: Secondary | ICD-10-CM | POA: Diagnosis not present

## 2018-09-16 DIAGNOSIS — E039 Hypothyroidism, unspecified: Secondary | ICD-10-CM | POA: Diagnosis not present

## 2018-09-16 DIAGNOSIS — Z743 Need for continuous supervision: Secondary | ICD-10-CM | POA: Diagnosis not present

## 2018-09-16 DIAGNOSIS — N189 Chronic kidney disease, unspecified: Secondary | ICD-10-CM | POA: Diagnosis not present

## 2018-09-16 DIAGNOSIS — Z792 Long term (current) use of antibiotics: Secondary | ICD-10-CM | POA: Diagnosis not present

## 2018-09-16 DIAGNOSIS — Z09 Encounter for follow-up examination after completed treatment for conditions other than malignant neoplasm: Secondary | ICD-10-CM | POA: Diagnosis not present

## 2018-09-16 DIAGNOSIS — J449 Chronic obstructive pulmonary disease, unspecified: Secondary | ICD-10-CM | POA: Diagnosis not present

## 2018-09-16 DIAGNOSIS — J45909 Unspecified asthma, uncomplicated: Secondary | ICD-10-CM | POA: Diagnosis not present

## 2018-09-16 DIAGNOSIS — Z8639 Personal history of other endocrine, nutritional and metabolic disease: Secondary | ICD-10-CM | POA: Diagnosis not present

## 2018-09-16 LAB — BASIC METABOLIC PANEL
BUN: 12 (ref 4–21)
Creatinine: 0.7 (ref 0.5–1.1)
Glucose: 99
Potassium: 3.4 (ref 3.4–5.3)
Sodium: 140 (ref 137–147)

## 2018-09-16 LAB — CBC AND DIFFERENTIAL
HCT: 29 — AB (ref 36–46)
Hemoglobin: 10 — AB (ref 12.0–16.0)
Platelets: 497 — AB (ref 150–399)
WBC: 8.2

## 2018-09-16 LAB — HEPATIC FUNCTION PANEL
ALT: 8 (ref 7–35)
AST: 13 (ref 13–35)
Alkaline Phosphatase: 66 (ref 25–125)
Bilirubin, Total: 0.4

## 2018-09-16 NOTE — Progress Notes (Signed)
Location:  [facility] Room Number: 507-P Place of Service:  SNF [phone]) Provider:  Granville Lewis, PA-C  Hennie Duos, MD  Patient Care Team: Hennie Duos, MD as PCP - General (Internal Medicine)  Extended Emergency Contact Information Primary Emergency Contact: Jerral Bonito [phone] Johnnette Litter of [place] Phone: [phone] Relation: Niece Secondary Emergency Contact: Karen Chafe States of [place] Phone: [phone] Relation: Niece  Code Status:  DNR Goals of care: Advanced Directive information Advanced Directives 08/27/2018  Does Patient Have a Medical Advance Directive? Yes  Type of Advance Directive Out of facility DNR (pink MOST or yellow form)  Does patient want to make changes to medical advance directive? No - Patient declined  Copy of [place] in Chart? -  Pre-existing out of facility DNR order (yellow form or pink MOST form) Yellow form placed in chart (order not valid for inpatient use)     Chief Complaint  Patient presents with  . Hospitalization Follow-up    Patient readmitted to [facility]    HPI:  Pt is a 78 y.o. female seen today for hospital followup.  After hospitalization for progressive respiratory failure after positive COVID-19 test.  Patient is a long-term resident of facility with a history of osteoarthritis osteoporosis hypertension hypothyroidism hyperlipidemia suspected COPD GERD as well as dementia anxiety depression and atrial fibrillation who was admitted to the hospital initially with chest pain dyspnea cough and weakness as well as atrial fibrillation with rapid ventricular rate.  She had been treated at [facility] for a fever with a chest x-ray which showed a subtle right lower lobe infiltrate- she also tested positive for COVID-she did receive treatment with Levaquin.  However she later developed some increased chest pain and dyspnea with  tachycardia.  On hospital admission she was in atrial fibrillation with low blood pressure and tachycardia and febrile.  Her white count was elevated at 27,300- her troponin did eventually rise up to 0.108- she received cephapirin and Zosyn vancomycin fluids and an amiodarone infusion and was transferred from [facility] to [facility] because of lack of ICU beds at [facility].  Her blood cultures eventually grew out gram-positive cocci thought to be MRSA she then had 4- days of negative blood cultures.  A TTE was performed and showed an ejection fraction of 67% with no pericardial effusion no identifiable vascular vegetation.  A transesophageal echo garment was at advised however this was discussed with cardiology and it was thought the risk outweighed the benefits with the risk of COVID viral particles being spread in the care.  .  CT of the chest was performed revealed multifocal panlobular patchy groundglass opacity with intralobular septal thickening.  Favored multifocal pneumonia including atypical viral organisms with the history of COVID.  Also showed additional dense airspace opacity right lower lobe with cavitation to represent necrotizing pneumonia.  Infectious disease was consulted they gave her IV vancomycin for a minimum of 4 weeks I also advised treatment for COVID-19 disease with  remdesevir in addition to the 10 days of dexamethasone that she completed prior to her admission.  Regards atrial fibrillation with rapid ventricular rate with hypotension-this was thought secondary to the COVID disease.  And superimposed pneumonia.  Diltiazem initially was not successful so amiodarone infusion was begun.  She did convert to normal sinus rhythm was briefly anticoagulated with Lovenox- but per review of her medical  history with both rhythm control and anticoagulation this was preferred in the outpatient setting since at least 2018 because she was hypotensive with calcium  channel blockers.  She also was a fall risk.  She does continue on low-dose aspirin.  Currently she is lying in bed comfortably--she continues with confusion which is not really abnormal.  Vital signs appear to be stable she does not have any complaints nursing feels she is doing well  Past Medical History:  Diagnosis Date  . Acute encephalopathy 02/13/2016  . Anemia   . Anxiety   . Arthritis   . Asthma   . Barrett's esophagus   . Chronic kidney disease   . COPD (chronic obstructive pulmonary disease) (H[facility])   . Dementia (H[facility])   . Dementia with behavioral disturbance ([facility]) 02/13/2016  . Depression   . Disease of thyroid gland   . Diverticulitis   . GERD (gastroesophageal reflux disease)   . Hyperlipemia   . Hypertension   . Memory difficulties   . Osteoporosis   . Pancreatitis   . Paroxysmal atrial fibrillation with RVR ([facility]) 05/10/2016  . Psoriasis (a type of skin inflammation)   . Vertigo   . Vitamin D deficiency 08/31/2016   Past Surgical History:  Procedure Laterality Date  . BACK SURGERY    . brain shun    . CHOLECYSTECTOMY    . ROTATOR CUFF REPAIR    . SHOULDER SURGERY    . UPPER GI ENDOSCOPY  12/19/2015   [facility] End[facility], Include esophagus, stomach & duodenum or / Jejunum: Dx w/wo biopsys and dilatation; Surgeon : Leonia Reeves Rhoton MD    Allergies  Allergen Reactions  . Azithromycin Other (See Comments)    unknown  . Cephalexin Other (See Comments)    unknown  . Cephalosporins   . Ciprofloxacin Other (See Comments)    unknown  . Darvon [Propoxyphene]   . Flagyl [Metronidazole] Other (See Comments)    unknown  . Librax [Chlordiazepoxide-Clidinium] Other (See Comments)    unknown  . Macrodantin [Nitrofurantoin Macrocrystal]   . Nitrofuran Derivatives Other (See Comments)    unknown  . Other Other (See Comments)    unknown  . Oxycodone-Acetaminophen Other (See Comments)    unknown  . Percocet [Oxycodone-Acetaminophen]   . Quetiapine Other (See Comments)     unknown Potassium level dropped.  . Sulfa Antibiotics   . Sulfamethoxazole Other (See Comments)    unknown    Outpatient Encounter Medications as of 09/16/2018  Medication Sig  . acetaminophen (TYLENOL) 325 MG tablet Take 650 mg by mouth every 8 (eight) hours as needed.   Marland Kitchen albuterol (VENTOLIN HFA) 108 (90 Base) MCG/ACT inhaler Inhale 2 puffs into the lungs every 6 (six) hours as needed for wheezing or shortness of breath.  Marland Kitchen aspirin 81 MG chewable tablet Chew 81 mg by mouth daily.  . benzonatate (TESSALON) 100 MG capsule Take 100 mg by mouth 3 (three) times daily as needed for cough.  . bisacodyl (DULCOLAX) 10 MG suppository Place 10 mg rectally daily as needed for moderate constipation (Constipation not relieved by MOM).  . budesonide-formoterol (SYMBICORT) 160-4.5 MCG/ACT inhaler Inhale 2 puffs into the lungs 2 (two) times daily.  . cetirizine (ZYRTEC) 10 MG tablet Take 10 mg by mouth at bedtime.  . clonazePAM (KLONOPIN) 1 MG tablet Give 1 mg by mouth at bedtime  . donepezil (ARICEPT) 10 MG tablet Take 10 mg by mouth at bedtime.  . Ensure (ENSURE) Take 237 mLs by mouth 2 (two)  times daily between meals. Lactose-reduced  . famotidine (PEPCID) 20 MG tablet Take 20 mg by mouth at bedtime.   . fluticasone (FLONASE) 50 MCG/ACT nasal spray Place 2 sprays into both nostrils daily as needed for allergies.   Marland Kitchen. guaiFENesin (ROBITUSSIN) 100 MG/5ML liquid Take 400 mg by mouth every 6 (six) hours as needed for cough.   Marland Kitchen. HYDROcodone-acetaminophen (NORCO/VICODIN) 5-325 MG tablet Take 1 tablet by mouth every 8 (eight) hours.  Marland Kitchen. levothyroxine (SYNTHROID, LEVOTHROID) 100 MCG tablet Take 100 mcg by mouth daily before breakfast.  . magnesium hydroxide (MILK OF MAGNESIA) 400 MG/5ML suspension Take 30 mLs by mouth daily as needed for mild constipation (If no BM in 3 days).  . mineral oil enema Place 1 enema rectally once. For constipation not relieved by Dulcolax suppository  . oxybutynin (DITROPAN) 5 MG  tablet Take 5 mg by mouth 3 (three) times daily.  . potassium chloride SA (K-DUR) 20 MEQ tablet Take 20 mEq by mouth daily.  Marland Kitchen. rOPINIRole (REQUIP) 0.25 MG tablet Take 0.5 mg by mouth 2 (two) times a day. Take 2 tablets (0.5 mg)  . Skin Protectants, Misc. (EUCERIN) cream Apply 1 application topically daily. Apply to face  . Vancomycin HCl in NaCl 1-0.9 GM/200ML-% SOLN Inject 200 mLs into the vein daily. x21 days - ending 10/06/18  . venlafaxine XR (EFFEXOR-XR) 75 MG 24 hr capsule take 1 tab po daily for depression  . zinc sulfate 50 MG CAPS capsule Take 220 mg by mouth daily.   No facility-administered encounter medications on file as of 09/16/2018.     Review of Systems   This is limited secondary to patient's dementia provided by nursing as well.  In general no complaints of fever chills.  Skin no complaints of rashes itching or diaphoresis.  Head ears eyes nose mouth and throat does not complain of visual changes or sore throat.  Respiratory is not complaining of shortness of breath or cough.  Cardiac does not complain of chest pain or edema.  GI does not complain of abdominal discomfort nausea vomiting diarrhea constipation.  GU does not complain of dysuria.  Musculoskeletal has weakness but does not really complain of pain at this time.  Neurologic positive for weakness does not complain of headache dizziness or syncope.  And psych is not complaining of being depressed or anxious she does have dementia as well as anxiety she does continue on clonazepam as well as Aricept.    Immunization History  Administered Date(s) Administered  . Influenza, High Dose Seasonal PF 12/20/2014  . Influenza, Seasonal, Injecte, Preservative Fre 10/10/2014  . Influenza-Unspecified 12/07/2015, 11/21/2016, 11/19/2017  . PPD Test 02/10/2016, 02/26/2016  . Pneumococcal Conjugate-13 12/20/2014  . Pneumococcal Polysaccharide-23 02/07/2017  . Td 08/26/2014   Pertinent  Health Maintenance Due   Topic Date Due  . INFLUENZA VACCINE  09/12/2018  . DEXA SCAN  Completed  . PNA vac Low Risk Adult  Completed   Fall Risk  07/31/2017 07/31/2016  Falls in the past year? No Yes  Number falls in past yr: - 2 or more  Injury with Fall? - No   Functional Status Survey:    Vitals:   09/16/18 1606  BP: 130/80  Pulse: 70  Resp: 18  Temp: (!) 93 F (33.9 C)  TempSrc: Oral  SpO2: 93%  Weight: 142 lb 9.6 oz (64.7 kg)  Height: 5\' 3"  (1.6 m)   Body mass index is 25.26 kg/m. Physical Exam   In general this is a  pleasant elderly female in no distress lying comfortably in bed she is bright and alert.  Her skin is warm and dry.  Eyes visual acuity appears to be intact sclera and conjunctive are clear.  Chest is clear to auscultation with somewhat poor respiratory effort could not really appreciate any labored breathing or or overt congestion.  Heart is regular rate and rhythm without murmur gallop or rub she does not have significant edema.  Abdomen is soft nontender with positive bowel sounds.  Musculoskeletal Limited exam since she is in bed but is able to move all extremities x4 it appears to be at relative baseline.  Neurologic is grossly intact her speech is clear cannot really appreciate lateralizing findings.  Psych she is oriented to self will follow some simple verbal commands does have confusion.     Labs reviewed: Recent Labs    09/06/18 09/15/18 09/16/18  NA 138 138 140  K 4.0 3.5 3.4  BUN 16 14 12   CREATININE 0.8 0.8 0.7   Recent Labs    12/22/17 09/15/18 09/16/18  AST 13 13 13   ALT 8 8 8   ALKPHOS 88 68 66   Recent Labs    09/06/18 09/15/18 09/16/18  WBC 27.3 8.5 8.2  NEUTROABS 25  --   --   HGB 12.3 9.8* 10.0*  HCT 36 29* 29*  PLT 508* 534* 497*   Lab Results  Component Value Date   TSH 3.88 12/22/2017   Lab Results  Component Value Date   HGBA1C 5.4 12/22/2017   Lab Results  Component Value Date   CHOL 185 12/22/2017   HDL 34 (A)  12/22/2017   LDLCALC 110 12/22/2017   TRIG 203 (A) 12/22/2017    Significant Diagnostic Results in last 30 days:  No results found.  Assessment/Plan  #1 history of COVID-19 complicated with pneumonia- she did receive IV antibiotics in hospital as noted above she also received Remdesevir and has completed a course of dexamethasone.  Clinically she appears to be significantly improved.  She continues on IV vancomycin for total 4 weeks she will need a weekly vancomycin level drawn- she continues on albuterol as needed as well as guaifenesin as needed clinically she appears stable at this point. She also has orders for Symbicort every 12 hours  2.  History of atrial fibrillation as noted in discussion above she is only currently on aspirin for anticoagulation- she did achieve normal sinus rhythm after interventions in the hospital including diltiazem initially but eventually required an amiodarone infusion which resulted in normal sinus rhythm- per review of past medical history it was decided not to pursue aggressive anticoagulation as well as extensive rate control secondary to episodes of hypotension with calcium channel blockers in the past and her fall risk- at this point will monitor  #3 history of dementia at this point will monitor continue supportive care she is on Aricept 10 mg a day.  4.  Hypothyroidism she continues on Synthroid 100 mcg a day most recent TSH appeared  to be in normal limits at 3.88  #5 history of overactive bladder she continues on oxybutynin 5 mg 3 times daily.  6.  History of depression with anxiety she continues on Effexor75 mg a day in addition to clonazepam 1 mg nightly as needed for 14 days.  #7 history of allergic rhinitis she continues on Zyrtec 10 mg a day as well as Flonase as needed.  8.  History of GERD she continues on Pepcid 20 mg a day.  9.  History of hypokalemia she is on potassium supplementation again we will update a BMP next week.  Of  note per orders from hospital will need a CBC CMP vancomycin trough done weekly with results faxed to them at [phone].  [id number]- of note greater than 50 minutes spent assessing patient-reviewing her chart and labs-discussing her status with nursing- and coordinating and formulating a plan of care for numerous diagnoses-of note greater than 50% of time spent coordinating a plan of care with input as noted above   7.

## 2018-09-17 ENCOUNTER — Other Ambulatory Visit: Payer: Self-pay | Admitting: Internal Medicine

## 2018-09-17 DIAGNOSIS — Z515 Encounter for palliative care: Secondary | ICD-10-CM | POA: Insufficient documentation

## 2018-09-17 MED ORDER — HYDROCODONE-ACETAMINOPHEN 5-325 MG PO TABS: 1.0000 | ORAL_TABLET | Freq: Three times a day (TID) | ORAL | 0 refills | Status: DC

## 2018-09-18 ENCOUNTER — Non-Acute Institutional Stay (SKILLED_NURSING_FACILITY): Payer: Medicare Other | Admitting: Internal Medicine

## 2018-09-18 ENCOUNTER — Encounter: Payer: Self-pay | Admitting: Internal Medicine

## 2018-09-18 DIAGNOSIS — J850 Gangrene and necrosis of lung: Secondary | ICD-10-CM

## 2018-09-18 DIAGNOSIS — F329 Major depressive disorder, single episode, unspecified: Secondary | ICD-10-CM

## 2018-09-18 DIAGNOSIS — R7881 Bacteremia: Secondary | ICD-10-CM | POA: Diagnosis not present

## 2018-09-18 DIAGNOSIS — I480 Paroxysmal atrial fibrillation: Secondary | ICD-10-CM

## 2018-09-18 DIAGNOSIS — F32A Depression, unspecified: Secondary | ICD-10-CM

## 2018-09-18 DIAGNOSIS — F02818 Dementia in other diseases classified elsewhere, unspecified severity, with other behavioral disturbance: Secondary | ICD-10-CM

## 2018-09-18 DIAGNOSIS — F0281 Dementia in other diseases classified elsewhere with behavioral disturbance: Secondary | ICD-10-CM

## 2018-09-18 DIAGNOSIS — G301 Alzheimer's disease with late onset: Secondary | ICD-10-CM | POA: Diagnosis not present

## 2018-09-18 DIAGNOSIS — U071 COVID-19: Secondary | ICD-10-CM | POA: Diagnosis not present

## 2018-09-18 DIAGNOSIS — E034 Atrophy of thyroid (acquired): Secondary | ICD-10-CM

## 2018-09-18 NOTE — Progress Notes (Signed)
: Provider:  Margit HanksAlexander, Oneal Schoenberger D., MD Location:  [facility] Room Number: 507-P Place of Service:  SNF ([phone])  PCP: Margit HanksAlexander, Raymie Giammarco D, MD Patient Care Team: Margit HanksAlexander, Linette Gunderson D, MD as PCP - General (Internal Medicine)  Extended Emergency Contact Information Primary Emergency Contact: Docia BarrierHopkins,Tammy          [name] [phone] [place] Home Phone: [phone] Relation: Niece Secondary Emergency Contact: [name],Donna  [place] Home Phone: [phone] Relation: Niece     Allergies: Azithromycin, Cephalexin, Cephalosporins, Ciprofloxacin, Darvon [propoxyphene], Flagyl [metronidazole], Librax [chlordiazepoxide-clidinium], Macrodantin [nitrofurantoin macrocrystal], Nitrofuran derivatives, Other, Oxycodone-acetaminophen, Percocet [oxycodone-acetaminophen], Quetiapine, Sulfa antibiotics, and Sulfamethoxazole  Chief Complaint  Patient presents with   Readmit To SNF    Patient is seen for readmission to [facility]    HPI: Patient is an 78 y.o. female with COPD, GERD, hyperlipidemia, hypothyroidism, hypertension, osteoporosis, osteoarthritis, psoriasis, left rotator cuff tear, dementia, anxiety, depression, paroxysmal atrial for ablation, vitamin D deficiency, and anemia who was admitted on 7/26 at [facility] for dyspnea cough and weakness.  She been treated in her SNF since 7/16 for a right lower lobe infiltrate with previous left lower lobe infiltrate resolving.  She has been treated with Levaquin and fluids.  On presentation to the emergency department she complained of chest pain dyspnea was found to be tachycardia tachycardic, and atrial fib with RVR blood pressure of 88/76, febrile to 102 and saturating at 97% on 4 L nasal cannula.  Initial work-up notable for WBC of 27.3 d-dimer of 1810 BNP 279 blood cultures were obtained, she received cefepime and Zosyn, fluids, amiodarone infusion and was [facility] [place] due to lack of ICU beds..  Blood cultures ultimately grew out MRSA with [facility] showing the same.  TTE was negative.  Chest x-ray abnormalities left a CT of chest which revealed multifocal panlobular patchy groundglass opacities favoring multifocal pneumonia including atypical/viral organisms in this patient with positive history of COVID-19 and some small areas of cavitation likely representing necrotizing pneumonia..  This will be treated with 4 weeks of IV antibiotics.  Patient is atrial for with RVR converted to normal sinus rhythm.  Total hospitalization was from 7/26-8/5.  Patient admitted back to skilled nursing facility for OT/PT and for residential care.  While at skilled nursing facility patient will be followed for dementia treated with Aricept, hypothyroidism treated with Synthroid and depression treated with Effexor.   Past Medical History:  Diagnosis Date   Acute encephalopathy 02/13/2016   Anemia    Anxiety    Arthritis    Asthma    Barrett's esophagus    Chronic kidney disease    COPD (chronic obstructive pulmonary disease) (HCC)    Dementia with behavioral disturbance (HCC) 02/13/2016   Depression    Disease of thyroid gland    Diverticulitis    GERD (gastroesophageal reflux disease)    Hyperlipemia    Hypertension    Memory difficulties    Osteoporosis    Pancreatitis    Paroxysmal atrial fibrillation with RVR (HCC) 05/10/2016   Psoriasis (a type of skin inflammation)    Vertigo    Vitamin D deficiency 08/31/2016    Past Surgical History:  Procedure Laterality Date   BACK SURGERY     brain shun     CHOLECYSTECTOMY     ROTATOR CUFF REPAIR     SHOULDER SURGERY  UPPER GI ENDOSCOPY  12/19/2015   [facility] End[facility], Include esophagus, stomach & duodenum or / Jejunum: Dx w/wo biopsys and dilatation; Surgeon : Leonia ReevesAlbert John Rhoton MD    Allergies as of 09/18/2018      Reactions    Azithromycin Other (See Comments)   unknown   Cephalexin Other (See Comments)   unknown   Cephalosporins    Ciprofloxacin Other (See Comments)   unknown   Darvon [propoxyphene]    Flagyl [metronidazole] Other (See Comments)   unknown   Librax [chlordiazepoxide-clidinium] Other (See Comments)   unknown   Macrodantin [nitrofurantoin Macrocrystal]    Nitrofuran Derivatives Other (See Comments)   unknown   Other Other (See Comments)   unknown   Oxycodone-acetaminophen Other (See Comments)   unknown   Percocet [oxycodone-acetaminophen]    Quetiapine Other (See Comments)   unknown Potassium level dropped.   Sulfa Antibiotics    Sulfamethoxazole Other (See Comments)   unknown      Medication List       Accurate as of September 18, 2018  9:11 AM. If you have any questions, ask your nurse or doctor.        acetaminophen 325 MG tablet Commonly known as: TYLENOL Take 650 mg by mouth every 8 (eight) hours as needed.   albuterol 108 (90 Base) MCG/ACT inhaler Commonly known as: VENTOLIN HFA Inhale 2 puffs into the lungs every 6 (six) hours as needed for wheezing or shortness of breath.   aspirin 81 MG chewable tablet Chew 81 mg by mouth daily.   benzonatate 100 MG capsule Commonly known as: TESSALON Take 100 mg by mouth 3 (three) times daily as needed for cough.   bisacodyl 10 MG suppository Commonly known as: DULCOLAX Place 10 mg rectally daily as needed for moderate constipation (Constipation not relieved by MOM).   budesonide-formoterol 160-4.5 MCG/ACT inhaler Commonly known as: SYMBICORT Inhale 2 puffs into the lungs 2 (two) times daily.   cetirizine 10 MG tablet Commonly known as: ZYRTEC Take 10 mg by mouth at bedtime.   clonazePAM 1 MG tablet Commonly known as: KLONOPIN Take 1 mg by mouth at bedtime as needed for anxiety. x14 days - stopping 09/29/18 What changed: Another medication with the same name was removed. Continue taking this medication, and follow the  directions you see here. Changed by: Merrilee SeashoreAnne Cordarro Spinnato, MD   donepezil 10 MG tablet Commonly known as: ARICEPT Take 10 mg by mouth at bedtime.   Ensure Clear Liqd Take 237 mLs by mouth 2 (two) times daily. Lactose-reduced Ensure What changed: Another medication with the same name was removed. Continue taking this medication, and follow the directions you see here. Changed by: Merrilee SeashoreAnne Ondra Deboard, MD   eucerin cream Apply 1 application topically daily. Apply to face   famotidine 20 MG tablet Commonly known as: PEPCID Take 20 mg by mouth at bedtime.   fluticasone 50 MCG/ACT nasal spray Commonly known as: FLONASE Place 2 sprays into both nostrils daily as needed for allergies.   guaiFENesin 100 MG/5ML liquid Commonly known as: ROBITUSSIN Take 400 mg by mouth every 6 (six) hours as needed for cough.   HYDROcodone-acetaminophen 5-325 MG tablet Commonly known as: NORCO/VICODIN Take 1 tablet by mouth every 8 (eight) hours.   levothyroxine 100 MCG tablet Commonly known as: SYNTHROID Take 100 mcg by mouth daily before breakfast.   magnesium hydroxide 400 MG/5ML suspension Commonly known as: MILK OF MAGNESIA Take 30 mLs by mouth daily as needed for mild constipation (If no BM  in 3 days).   mineral oil enema Place 1 enema rectally once. For constipation not relieved by Dulcolax suppository   oxybutynin 5 MG tablet Commonly known as: DITROPAN Take 5 mg by mouth 3 (three) times daily.   potassium chloride SA 20 MEQ tablet Commonly known as: K-DUR Take 20 mEq by mouth daily.   rOPINIRole 0.25 MG tablet Commonly known as: REQUIP Take 0.5 mg by mouth 2 (two) times a day. Take 2 tablets (0.5 mg)   Vancomycin HCl in NaCl 1-0.9 GM/200ML-% Soln Inject 200 mLs into the vein daily. x21 days - ending 10/06/18   venlafaxine XR 75 MG 24 hr capsule Commonly known as: EFFEXOR-XR take 1 tab po daily for depression   zinc sulfate 50 MG Caps capsule Take 220 mg by mouth daily.       No  orders of the defined types were placed in this encounter.   Immunization History  Administered Date(s) Administered   Influenza, High Dose Seasonal PF 12/20/2014   Influenza, Seasonal, Injecte, Preservative Fre 10/10/2014   Influenza-Unspecified 12/07/2015, 11/21/2016, 11/19/2017   PPD Test 02/10/2016, 02/26/2016   Pneumococcal Conjugate-13 12/20/2014   Pneumococcal Polysaccharide-23 02/07/2017   Td 08/26/2014    Social History   Tobacco Use   Smoking status: Former Smoker    Quit date: 12/17/1990    Years since quitting: 27.7   Smokeless tobacco: Never Used  Substance Use Topics   Alcohol use: No    Family history is   Family History  Problem Relation Age of Onset   Arthritis Mother    Heart disease Mother    Hypertension Mother    Cancer Father    Arthritis Father    Heart disease Father    Alzheimer's disease Sister    Arthritis Sister    Heart disease Sister    Memory loss Sister    Cancer Sister    Heart attack Sister    Cancer Brother    Lung disease Brother    Heart disease Brother       Review of Systems  DATA OBTAINED: from patient, nurse GENERAL:  no fevers, fatigue, appetite changes SKIN: No itching, or rash EYES: No eye pain, redness, discharge EARS: No earache, tinnitus, change in hearing NOSE: No congestion, drainage or bleeding  MOUTH/THROAT: No mouth or tooth pain, No sore throat RESPIRATORY: No cough, wheezing, SOB CARDIAC: No chest pain, palpitations, lower extremity edema  GI: No abdominal pain, No N/V/D or constipation, No heartburn or reflux  GU: No dysuria, frequency or urgency, or incontinence  MUSCULOSKELETAL: No unrelieved bone/joint pain NEUROLOGIC: No headache, dizziness or focal weakness PSYCHIATRIC: No c/o anxiety or sadness   Vitals:   09/18/18 0856  BP: 108/73  Pulse: 83  Resp: 18  Temp: 98.4 F (36.9 C)  SpO2: 93%    SpO2 Readings from Last 1 Encounters:  09/18/18 93%   Body mass index  is 25.26 kg/m.     Physical Exam  GENERAL APPEARANCE: Alert, conversant,  No acute distress.  SKIN: No diaphoresis rash HEAD: Normocephalic, atraumatic  EYES: Conjunctiva/lids clear. Pupils round, reactive. EOMs intact.  EARS: External exam WNL, canals clear. Hearing grossly normal.  NOSE: No deformity or discharge.  MOUTH/THROAT: Lips w/o lesions  RESPIRATORY: Breathing is even, unlabored. Lung sounds are clear   CARDIOVASCULAR: Heart RRR no murmurs, rubs or gallops. No peripheral edema.   GASTROINTESTINAL: Abdomen is soft, non-tender, not distended w/ normal bowel sounds. GENITOURINARY: Bladder non tender, not distended  MUSCULOSKELETAL: No  abnormal joints or musculature NEUROLOGIC:  Cranial nerves 2-12 grossly intact. Moves all extremities  PSYCHIATRIC: Patient with some confusion intermittently, no behavioral issues  Patient Active Problem List   Diagnosis Date Noted   Hospice care patient 09/17/2018   Dehydration 08/30/2018   Real time reverse transcriptase PCR positive for COVID-19 virus 08/30/2018   Bilateral pneumonia 07/26/2018   Rhonchi 07/26/2018   Xerosis cutis 07/26/2018   Restless leg syndrome 05/19/2018   Chronic generalized pain 05/19/2018   Hypotension 08/31/2016   Postural dizziness with presyncope 08/31/2016   Chest pain 08/31/2016   Paroxysmal atrial fibrillation (HCC) 08/31/2016   Hypokalemia 08/31/2016   Depression 08/31/2016   Vitamin D deficiency 08/31/2016   Mood disorder (HCC) 08/22/2016   PNA (pneumonia) 05/10/2016   COPD exacerbation (HCC) 05/10/2016   UTI (urinary tract infection) 05/10/2016   Paroxysmal atrial fibrillation with RVR (HCC) 05/10/2016   Metabolic encephalopathy 05/10/2016   Elevated amylase and lipase 02/13/2016   Acute encephalopathy 02/13/2016   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) 02/13/2016   Dementia with behavioral disturbance (HCC) 02/13/2016   Overactive bladder 02/13/2016   Chronic kidney disease     COPD (chronic obstructive pulmonary disease) (HCC)    Anemia    Anxiety    Barrett's esophagus    Hypothyroidism    GERD (gastroesophageal reflux disease)    Hypertension    Memory difficulties    Osteoporosis    Psoriasis (a type of skin inflammation)       Labs reviewed: Basic Metabolic Panel:    Component Value Date/Time   NA 140 09/16/2018   NA 143 03/18/2017   K 3.4 09/16/2018   K 3.8 03/18/2017   CL 100 (L) 01/26/2016 1935   CO2 30 01/26/2016 1935   GLUCOSE 115 (H) 01/26/2016 1935   BUN 12 09/16/2018   CREATININE 0.7 09/16/2018   CREATININE 0.64 03/18/2017   CALCIUM 9.2 03/18/2017   ALBUMIN 3.8 03/18/2017   AST 13 09/16/2018   AST 9 03/18/2017   ALT 8 09/16/2018   ALT 7 03/18/2017   ALKPHOS 66 09/16/2018   ALKPHOS 99 03/18/2017   BILITOT 0.2 03/18/2017   GFRNONAA 85.41 03/18/2017   GFRAA >60 01/26/2016 1935    Recent Labs    09/06/18 09/15/18 09/16/18  NA 138 138 140  K 4.0 3.5 3.4  BUN 16 14 12   CREATININE 0.8 0.8 0.7   Liver Function Tests: Recent Labs    12/22/17 09/15/18 09/16/18  AST 13 13 13   ALT 8 8 8   ALKPHOS 88 68 66   No results for input(s): LIPASE, AMYLASE in the last 8760 hours. No results for input(s): AMMONIA in the last 8760 hours. CBC: Recent Labs    09/06/18 09/15/18 09/16/18  WBC 27.3 8.5 8.2  NEUTROABS 25  --   --   HGB 12.3 9.8* 10.0*  HCT 36 29* 29*  PLT 508* 534* 497*   Lipid Recent Labs    12/22/17  CHOL 185  HDL 34*  LDLCALC 110  TRIG 203*    Cardiac Enzymes: No results for input(s): CKTOTAL, CKMB, CKMBINDEX, TROPONINI in the last 8760 hours. BNP: No results for input(s): BNP in the last 8760 hours. No results found for: [facility] Lab Results  Component Value Date   HGBA1C 5.4 12/22/2017   Lab Results  Component Value Date   TSH 3.88 12/22/2017   Lab Results  Component Value Date   VITAMINB12 1,188 05/24/2016   No results found for: FOLATE No results found  for: IRON, TIBC,  FERRITIN  Imaging and Procedures obtained prior to SNF admission: Ct Abdomen Pelvis W Contrast  Result Date: 01/26/2016 CLINICAL DATA:  Lower abdominal pain and low back pain EXAM: CT ABDOMEN AND PELVIS WITH CONTRAST TECHNIQUE: Multidetector CT imaging of the abdomen and pelvis was performed using the standard protocol following bolus administration of intravenous contrast. CONTRAST:  12mL ISOVUE-300 IOPAMIDOL (ISOVUE-300) INJECTION 61% COMPARISON:  Chest radiograph 11/01/2007 FINDINGS: Lower chest: No pulmonary nodules. No visible pleural or pericardial effusion. Hepatobiliary: Normal hepatic size and contours without focal liver lesion. No perihepatic ascites. No intra- or extrahepatic biliary dilatation. Status post cholecystectomy. Pancreas: There is inflammatory stranding surrounding the head of the pancreas. No peripancreatic fluid collection. The pancreatic duct is not dilated. Spleen: Normal. Adrenals/Urinary Tract: Normal adrenal glands. No hydronephrosis or solid renal mass. Stomach/Bowel: There is descending colonic and sigmoid diverticulosis without evidence of acute inflammation. No dilated small bowel. No abdominal fluid collection. Normal appendix. Vascular/Lymphatic: There is atherosclerotic calcification of the non aneurysmal abdominal aorta. No abdominal or pelvic adenopathy. Reproductive: Normal uterus and ovaries for age. Musculoskeletal: There is compression deformity of the T8 vertebral body, favored to be chronic. This appears unchanged relative to the radiograph of 11/01/2007 Normal visualized extrathoracic and extraperitoneal soft tissues. Other: No contributory non-categorized findings. IMPRESSION: 1. Acute pancreatitis without evidence of pancreatic necrosis or peripancreatic fluid collection. 2. Aortic atherosclerosis. 3. Descending colonic and sigmoid diverticulosis without acute diverticulitis. Electronically Signed   By: Ulyses Jarred M.D.   On: 01/26/2016 21:57     Not all  labs, radiology exams or other studies done during hospitalization come through on my EPIC note; however they are reviewed by me.    Assessment and Plan  MRSA bacteremia/necrotizing pneumonia/COVID 19 pneumonia- blood cultures positive for MRSA, TTE performed on 7/29 with no valvular vegetations; CT of the chest without contrast obtained on 7/30 showing multifocal panlobular patchy groundglass opacities with interlobular septal thickening involving to a lesser degree the left lung, favoring multifocal pneumonia including atypical/viral organisms and additional airspace opacity in the right lower lobe with a small area of cavitation likely represent necrotizing pneumonia.  ID was consulted and advised IV vancomycin for a minimum of 4 weeks and also advised treatment for COVID-19 disease with Hassan Rowan severe in addition to the 10 days of dexamethasone she had completed prior to admission SNF- admitted back for OT/PT and residential care continue vancomycin for 4 weeks minimum, will be following up with infectious disease will be supplying all labs necessary for treatment with vancomycin; continue O2 until patient can be weaned  Atrial for with RVR- with hypotension; diltiazem was unsuccessful in terminating the atrial fibrillation so amiodarone infusion was begun which resulted in conversion to normal sinus rhythm.  Patient was briefly anticoagulated with full dose enoxaparin but long-term anticoagulate has been deferred secondary to falls in consultation with her legal guardian. SNF-we will follow-up all the prerequisite labs necessary for 4 weeks of vancomycin; patient will be following up with infectious disease before the 4 weeks or over  Dementia SNF- aggravated by patient's long hospitalization and COVID-19 positivity, but for sure that return to normalcy may improve the situation; continue Aricept 10 mg daily  Hypothyroidism SNF-not stated as uncontrolled; continue Synthroid 100 mcg  daily  Depression SNF- not stated as uncontrolled;, aggravated possibly by recent hospitalization; continue Effexor 75 mg daily   ;> 50% of time with patient was spent reviewing records, labs, tests and studies, counseling and developing plan of care  Hennie Duos, MD

## 2018-09-21 ENCOUNTER — Encounter: Payer: Self-pay | Admitting: Internal Medicine

## 2018-09-21 DIAGNOSIS — R7881 Bacteremia: Secondary | ICD-10-CM | POA: Insufficient documentation

## 2018-09-21 DIAGNOSIS — D649 Anemia, unspecified: Secondary | ICD-10-CM | POA: Diagnosis not present

## 2018-09-21 DIAGNOSIS — J181 Lobar pneumonia, unspecified organism: Secondary | ICD-10-CM | POA: Diagnosis not present

## 2018-09-21 LAB — CBC AND DIFFERENTIAL
HCT: 33 — AB (ref 36–46)
Hemoglobin: 11.6 — AB (ref 12.0–16.0)
Neutrophils Absolute: 4
Platelets: 361 (ref 150–399)
WBC: 6.3

## 2018-09-21 LAB — HEPATIC FUNCTION PANEL
ALT: 11 (ref 7–35)
AST: 18 (ref 13–35)
Alkaline Phosphatase: 91 (ref 25–125)
Bilirubin, Total: 0.3

## 2018-09-21 LAB — BASIC METABOLIC PANEL
BUN: 10 (ref 4–21)
Creatinine: 0.8 (ref 0.5–1.1)
Glucose: 94
Potassium: 3.6 (ref 3.4–5.3)
Sodium: 140 (ref 137–147)

## 2018-09-25 ENCOUNTER — Encounter: Payer: Self-pay | Admitting: Internal Medicine

## 2018-09-25 ENCOUNTER — Non-Acute Institutional Stay (SKILLED_NURSING_FACILITY): Payer: Medicare Other | Admitting: Internal Medicine

## 2018-09-25 DIAGNOSIS — U071 COVID-19: Secondary | ICD-10-CM | POA: Diagnosis not present

## 2018-09-25 DIAGNOSIS — J189 Pneumonia, unspecified organism: Secondary | ICD-10-CM | POA: Diagnosis not present

## 2018-09-25 NOTE — Progress Notes (Signed)
[facility] Room Number: 507-P Place of Service:  SNF (31)  Margit HanksAlexander, Taz Vanness D, MD  Patient Care Team: Margit HanksAlexander, Marjani Kobel D, MD as PCP - General (Internal Medicine)  Extended Emergency Contact Information Primary Emergency Contact: Docia BarrierHopkins,Tammy          [name] [phone] [place] Home Phone: [phone] Relation: Niece Secondary Emergency Contact: [name],Donna  [place] Home Phone: [phone] Relation: Niece    Allergies: Azithromycin, Cephalexin, Cephalosporins, Ciprofloxacin, Darvon [propoxyphene], Flagyl [metronidazole], Librax [chlordiazepoxide-clidinium], Macrodantin [nitrofurantoin macrocrystal], Nitrofuran derivatives, Other, Oxycodone-acetaminophen, Percocet [oxycodone-acetaminophen], Quetiapine, Sulfa antibiotics, and Sulfamethoxazole  Chief Complaint  Patient presents with  . Acute Visit    Patient is seen on the COVID floor to followup on COVID positivity    HPI: Patient is an 78 y.o. female who is being seen in follow-up for being COVID positive and having had pneumonia, been treated at the hospital with remdesivir and being readmitted to skilled nursing facility.  Patient was still oxygen dependent and confused when she was admitted back to SNF.  Today patient looks much better and is less confused.  She is no longer getting IV fluids and she is eating and drinking better.  Past Medical History:  Diagnosis Date  . Acute encephalopathy 02/13/2016  . Anemia   . Anxiety   . Arthritis   . Asthma   . Barrett's esophagus   . Chronic kidney disease   . COPD (chronic obstructive pulmonary disease) (HCC)   . Dementia with behavioral disturbance (HCC) 02/13/2016  . Depression   . Disease of thyroid gland   . Diverticulitis   . GERD (gastroesophageal reflux disease)   . Hyperlipemia   . Hypertension   . Memory difficulties   . Osteoporosis   . Pancreatitis   . Paroxysmal atrial fibrillation with  RVR (HCC) 05/10/2016  . Psoriasis (a type of skin inflammation)   . Vertigo   . Vitamin D deficiency 08/31/2016    Past Surgical History:  Procedure Laterality Date  . BACK SURGERY    . brain shun    . CHOLECYSTECTOMY    . ROTATOR CUFF REPAIR    . SHOULDER SURGERY    . UPPER GI ENDOSCOPY  12/19/2015   [facility], Include esophagus, stomach & duodenum or / Jejunum: Dx w/wo biopsys and dilatation; Surgeon : Leonia ReevesAlbert John Rhoton MD    Allergies as of 09/25/2018      Reactions   Azithromycin Other (See Comments)   unknown   Cephalexin Other (See Comments)   unknown   Cephalosporins    Ciprofloxacin Other (See Comments)   unknown   Darvon [propoxyphene]    Flagyl [metronidazole] Other (See Comments)   unknown   Librax [chlordiazepoxide-clidinium] Other (See Comments)   unknown   Macrodantin [nitrofurantoin Macrocrystal]    Nitrofuran Derivatives Other (See Comments)   unknown   Other Other (See Comments)   unknown   Oxycodone-acetaminophen Other (See Comments)   unknown   Percocet [oxycodone-acetaminophen]    Quetiapine Other (See Comments)   unknown Potassium level dropped.   Sulfa Antibiotics    Sulfamethoxazole Other (See Comments)   unknown      Medication List       Accurate as of September 25, 2018  5:10 PM. If you have any questions, ask your nurse or doctor.        acetaminophen 325 MG tablet Commonly known as: TYLENOL Take 650 mg by mouth every 8 (eight) hours  as needed.   albuterol 108 (90 Base) MCG/ACT inhaler Commonly known as: VENTOLIN HFA Inhale 2 puffs into the lungs every 6 (six) hours as needed for wheezing or shortness of breath.   aspirin 81 MG chewable tablet Chew 81 mg by mouth daily.   bisacodyl 10 MG suppository Commonly known as: DULCOLAX Place 10 mg rectally daily as needed for moderate constipation (Constipation not relieved by MOM).   budesonide-formoterol 160-4.5 MCG/ACT inhaler Commonly known as: SYMBICORT Inhale 2 puffs into the  lungs 2 (two) times daily.   cetirizine 10 MG tablet Commonly known as: ZYRTEC Take 10 mg by mouth at bedtime.   clonazePAM 1 MG tablet Commonly known as: KLONOPIN Take 1 mg by mouth at bedtime as needed for anxiety. x14 days - stopping 09/29/18   dextrose 5 % and 0.9% NaCl 5-0.9 % infusion Inject 75 mLs into the vein continuous. Via PICC line   donepezil 10 MG tablet Commonly known as: ARICEPT Take 10 mg by mouth at bedtime.   Ensure Clear Liqd Take 237 mLs by mouth 2 (two) times daily. Lactose-reduced Ensure   eucerin cream Apply 1 application topically daily. Apply to face   famotidine 20 MG tablet Commonly known as: PEPCID Take 20 mg by mouth at bedtime.   fluticasone 50 MCG/ACT nasal spray Commonly known as: FLONASE Place 2 sprays into both nostrils daily as needed for allergies.   guaiFENesin 100 MG/5ML liquid Commonly known as: ROBITUSSIN Take 400 mg by mouth every 6 (six) hours as needed for cough.   HYDROcodone-acetaminophen 5-325 MG tablet Commonly known as: NORCO/VICODIN Take 1 tablet by mouth every 8 (eight) hours.   levothyroxine 100 MCG tablet Commonly known as: SYNTHROID Take 100 mcg by mouth daily before breakfast.   magnesium hydroxide 400 MG/5ML suspension Commonly known as: MILK OF MAGNESIA Take 30 mLs by mouth daily as needed for mild constipation (If no BM in 3 days).   mineral oil enema Place 1 enema rectally once. For constipation not relieved by Dulcolax suppository   oxybutynin 5 MG tablet Commonly known as: DITROPAN Take 5 mg by mouth 3 (three) times daily.   potassium chloride SA 20 MEQ tablet Commonly known as: K-DUR Take 20 mEq by mouth daily.   rOPINIRole 0.25 MG tablet Commonly known as: REQUIP Take 0.5 mg by mouth 2 (two) times a day. Take 2 tablets (0.5 mg)   Vancomycin HCl in NaCl 1-0.9 GM/200ML-% Soln Inject 200 mLs into the vein daily. x21 days - ending 10/06/18   venlafaxine XR 75 MG 24 hr capsule Commonly known as:  EFFEXOR-XR take 1 tab po daily for depression   zinc sulfate 50 MG Caps capsule Take 220 mg by mouth daily.       No orders of the defined types were placed in this encounter.   Immunization History  Administered Date(s) Administered  . Influenza, High Dose Seasonal PF 12/20/2014  . Influenza, Seasonal, Injecte, Preservative Fre 10/10/2014  . Influenza-Unspecified 12/07/2015, 11/21/2016, 11/19/2017  . PPD Test 02/10/2016, 02/26/2016  . Pneumococcal Conjugate-13 12/20/2014  . Pneumococcal Polysaccharide-23 02/07/2017  . Td 08/26/2014    Social History   Tobacco Use  . Smoking status: Former Smoker    Quit date: 12/17/1990    Years since quitting: 27.7  . Smokeless tobacco: Never Used  Substance Use Topics  . Alcohol use: No    Review of Systems  DATA OBTAINED: from nurse GENERAL:  no fevers, fatigue, appetite changes SKIN: No itching, rash HEENT: No complaint  RESPIRATORY: No cough, wheezing, SOB CARDIAC: No chest pain, palpitations, lower extremity edema  GI: No abdominal pain, No N/V/D or constipation, No heartburn or reflux  GU: No dysuria, frequency or urgency, or incontinence  MUSCULOSKELETAL: No unrelieved bone/joint pain NEUROLOGIC: No headache, dizziness  PSYCHIATRIC: No overt anxiety or sadness, still confused  Vitals:   09/25/18 1552  BP: 110/70  Pulse: 84  Resp: 18  Temp: 98.2 F (36.8 C)  SpO2: 93%   Body mass index is 22.14 kg/m. Physical Exam  GENERAL APPEARANCE: Alert, conversant, No acute distress  SKIN: No diaphoresis rash HEENT: Unremarkable RESPIRATORY: Breathing is even, unlabored. Lung sounds are clear   CARDIOVASCULAR: Heart RRR no murmurs, rubs or gallops. No peripheral edema  GASTROINTESTINAL: Abdomen is soft, non-tender, not distended w/ normal bowel sounds.  GENITOURINARY: Bladder non tender, not distended  MUSCULOSKELETAL: No abnormal joints or musculature NEUROLOGIC: Cranial nerves 2-12 grossly intact. Moves all extremities  PSYCHIATRIC: Still some confusion but much of her conversation today made sense, no behavioral issues  Patient Active Problem List   Diagnosis Date Noted  . MRSA bacteremia 09/21/2018  . Hospice care patient 09/17/2018  . Dehydration 08/30/2018  . Real time reverse transcriptase PCR positive for COVID-19 virus 08/30/2018  . Bilateral pneumonia 07/26/2018  . Rhonchi 07/26/2018  . Xerosis cutis 07/26/2018  . Restless leg syndrome 05/19/2018  . Chronic generalized pain 05/19/2018  . Hypotension 08/31/2016  . Postural dizziness with presyncope 08/31/2016  . Chest pain 08/31/2016  . Paroxysmal atrial fibrillation ([place]) 08/31/2016  . Hypokalemia 08/31/2016  . Depression 08/31/2016  . Vitamin D deficiency 08/31/2016  . Mood disorder ([place]) 08/22/2016  . Necrotizing pneumonia ([place]) 05/10/2016  . COPD exacerbation ([place]) 05/10/2016  . UTI (urinary tract infection) 05/10/2016  . Paroxysmal atrial fibrillation with RVR ([place]) 05/10/2016  . Metabolic encephalopathy 37/11/6267  . Elevated amylase and lipase 02/13/2016  . Acute encephalopathy 02/13/2016  . Psychosis in elderly ([place]) 02/13/2016  . Dementia with behavioral disturbance ([place]) 02/13/2016  . Overactive bladder 02/13/2016  . Chronic kidney disease   . COPD (chronic obstructive pulmonary disease) ([place])   . Anemia   . Anxiety   . Barrett's esophagus   . Hypothyroidism   . GERD (gastroesophageal reflux disease)   . Hypertension   . Memory difficulties   . Osteoporosis   . Psoriasis (a type of skin inflammation)     CMP     Component Value Date/Time   NA 140 09/21/2018   NA 143 03/18/2017   K 3.6 09/21/2018   K 3.8 03/18/2017   CL 100 (L) 01/26/2016 1935   CO2 30 01/26/2016 1935   GLUCOSE 115 (H) 01/26/2016 1935   BUN 10 09/21/2018   CREATININE 0.8 09/21/2018   CREATININE 0.64 03/18/2017   CALCIUM 9.2 03/18/2017   ALBUMIN 3.8 03/18/2017   AST 18 09/21/2018   AST 9 03/18/2017   ALT 11 09/21/2018   ALT 7 03/18/2017    ALKPHOS 91 09/21/2018   ALKPHOS 99 03/18/2017   BILITOT 0.2 03/18/2017   GFRNONAA 85.41 03/18/2017   GFRAA >60 01/26/2016 1935   Recent Labs    09/15/18 09/16/18 09/21/18  NA 138 140 140  K 3.5 3.4 3.6  BUN 14 12 10   CREATININE 0.8 0.7 0.8   Recent Labs    09/15/18 09/16/18 09/21/18  AST 13 13 18   ALT 8 8 11   ALKPHOS 68 66 91   Recent Labs    09/06/18 09/15/18 09/16/18 09/21/18  WBC 27.3  8.5 8.2 6.3  NEUTROABS 25  --   --  4  HGB 12.3 9.8* 10.0* 11.6*  HCT 36 29* 29* 33*  PLT 508* 534* 497* 361   Recent Labs    12/22/17  CHOL 185  LDLCALC 110  TRIG 203*   No results found [facility]: [facility] Lab Results  Component Value Date   TSH 3.88 12/22/2017   Lab Results  Component Value Date   HGBA1C 5.4 12/22/2017   Lab Results  Component Value Date   CHOL 185 12/22/2017   HDL 34 (A) 12/22/2017   LDLCALC 110 12/22/2017   TRIG 203 (A) 12/22/2017    Significant Diagnostic Results in last 30 days:  No results found.  Assessment and Plan  COVID-19 positive/status post pneumonia/poor p.o. intake- patient continues to improve, is no longer oxygen requiring ,is no longer requiring IV fluids and mental status is improving; continue observation, continue supportive care.  Patient is in isolation unit requiring full PPE for every visit     Margit HanksAnne D Kaelynne Christley, MD

## 2018-09-27 ENCOUNTER — Encounter: Payer: Self-pay | Admitting: Internal Medicine

## 2018-09-29 DIAGNOSIS — J189 Pneumonia, unspecified organism: Secondary | ICD-10-CM | POA: Diagnosis not present

## 2018-09-29 DIAGNOSIS — R7881 Bacteremia: Secondary | ICD-10-CM | POA: Diagnosis not present

## 2018-09-29 DIAGNOSIS — D649 Anemia, unspecified: Secondary | ICD-10-CM | POA: Diagnosis not present

## 2018-09-29 DIAGNOSIS — B9562 Methicillin resistant Staphylococcus aureus infection as the cause of diseases classified elsewhere: Secondary | ICD-10-CM | POA: Diagnosis not present

## 2018-09-29 DIAGNOSIS — Z09 Encounter for follow-up examination after completed treatment for conditions other than malignant neoplasm: Secondary | ICD-10-CM | POA: Diagnosis not present

## 2018-09-29 DIAGNOSIS — Z792 Long term (current) use of antibiotics: Secondary | ICD-10-CM | POA: Diagnosis not present

## 2018-09-29 DIAGNOSIS — J850 Gangrene and necrosis of lung: Secondary | ICD-10-CM | POA: Diagnosis not present

## 2018-09-29 DIAGNOSIS — J181 Lobar pneumonia, unspecified organism: Secondary | ICD-10-CM | POA: Diagnosis not present

## 2018-09-29 DIAGNOSIS — U071 COVID-19: Secondary | ICD-10-CM | POA: Diagnosis not present

## 2018-09-29 LAB — CBC AND DIFFERENTIAL
HCT: 32 — AB (ref 36–46)
Hemoglobin: 10.6 — AB (ref 12.0–16.0)
Neutrophils Absolute: 4
Platelets: 230 (ref 150–399)
WBC: 6.3

## 2018-09-29 LAB — BASIC METABOLIC PANEL
BUN: 4 (ref 4–21)
Creatinine: 0.7 (ref 0.5–1.1)
Glucose: 99
Potassium: 2.7 — AB (ref 3.4–5.3)
Sodium: 141 (ref 137–147)

## 2018-09-29 LAB — HEPATIC FUNCTION PANEL
ALT: 11 (ref 7–35)
AST: 13 (ref 13–35)
Alkaline Phosphatase: 92 (ref 25–125)
Bilirubin, Total: 0.2

## 2018-09-30 ENCOUNTER — Encounter: Payer: Self-pay | Admitting: Internal Medicine

## 2018-09-30 ENCOUNTER — Non-Acute Institutional Stay (SKILLED_NURSING_FACILITY): Payer: Medicare Other | Admitting: Internal Medicine

## 2018-09-30 DIAGNOSIS — J850 Gangrene and necrosis of lung: Secondary | ICD-10-CM | POA: Diagnosis not present

## 2018-09-30 DIAGNOSIS — R7881 Bacteremia: Secondary | ICD-10-CM

## 2018-09-30 DIAGNOSIS — E876 Hypokalemia: Secondary | ICD-10-CM

## 2018-09-30 NOTE — Progress Notes (Signed)
Location:  [place] Room Number: 507-P Place of Service:  SNF [phone]) Provider:  Granville Lewis, PA-C  Hennie Duos, MD  Patient Care Team: Hennie Duos, MD as PCP - General (Internal Medicine)  Extended Emergency Contact Information Primary Emergency Contact: Jerral Bonito [phone] Johnnette Litter of [place] Phone: [phone] Relation: Niece Secondary Emergency Contact: Karen Chafe [place] of [place] Phone: [phone] Relation: Niece  Code Status:  DNR Goals of care: Advanced Directive information Advanced Directives 09/30/2018  Does Patient Have a Medical Advance Directive? Yes  Type of Advance Directive Out of facility DNR (pink MOST or yellow form)  Does patient want to make changes to medical advance directive? No - Patient declined  Copy of [place] in Chart? -  Pre-existing out of facility DNR order (yellow form or pink MOST form) Yellow form placed in chart (order not valid for inpatient use)     Chief Complaint  Patient presents with  . Acute Visit    Patient is seen for hypokalemia (potassium 2.7)    HPI:  Pt is a 78 y.o. female seen today for an acute visit secondary to hypokalemia (potassium 2.7) demonstrated on labs.  She is a long-term resident of the facility and is in the isolation unit status post hospitalization for being COVID positive for pneumonia.  Currently she is still receiving IV vancomycin and appears to be doing quite well her confusion has improved significantly she is bright alert still has some confusion but appears to be close to her baseline  Routine lab yesterday did show a potassium of 2.7 she does have a history of some hypokalemia in the past and was on potassium 20 mEq a day.  They did receive an order from Dr. Sheppard Coil last night to give an additional 60 mEq and to recheck a lab tomorrow.  Currently she is bright and alert appears to be  feeling well she is actually finishing brushing her teeth at the sink nursing staff feels she continues to improve she does not really have any complaints today still has some confusion but per staff this is baseline.  Her vital signs remain stable  Past Medical History:  Diagnosis Date  . Acute encephalopathy 02/13/2016  . Anemia   . Anxiety   . Arthritis   . Asthma   . Barrett's esophagus   . Chronic kidney disease   . COPD (chronic obstructive pulmonary disease) ([place])   . Dementia with behavioral disturbance ([place]) 02/13/2016  . Depression   . Disease of thyroid gland   . Diverticulitis   . GERD (gastroesophageal reflux disease)   . Hyperlipemia   . Hypertension   . Memory difficulties   . Osteoporosis   . Pancreatitis   . Paroxysmal atrial fibrillation with RVR ([place]) 05/10/2016  . Psoriasis (a type of skin inflammation)   . Vertigo   . Vitamin D deficiency 08/31/2016   Past Surgical History:  Procedure Laterality Date  . BACK SURGERY    . brain shun    . CHOLECYSTECTOMY    . ROTATOR CUFF REPAIR    . SHOULDER SURGERY    . UPPER GI ENDOSCOPY  12/19/2015   [facility], Include esophagus, stomach & duodenum or / Jejunum: Dx w/wo biopsys and dilatation; Surgeon : Cristie Hem Rhoton MD    Allergies  Allergen Reactions  . Azithromycin Other (See Comments)    unknown  .  Cephalexin Other (See Comments)    unknown  . Cephalosporins   . Ciprofloxacin Other (See Comments)    unknown  . Darvon [Propoxyphene]   . Flagyl [Metronidazole] Other (See Comments)    unknown  . Librax [Chlordiazepoxide-Clidinium] Other (See Comments)    unknown  . Macrodantin [Nitrofurantoin Macrocrystal]   . Nitrofuran Derivatives Other (See Comments)    unknown  . Other Other (See Comments)    unknown  . Oxycodone-Acetaminophen Other (See Comments)    unknown  . Percocet [Oxycodone-Acetaminophen]   . Quetiapine Other (See Comments)    unknown Potassium level dropped.  . Sulfa Antibiotics   .  Sulfamethoxazole Other (See Comments)    unknown    Outpatient Encounter Medications as of 09/30/2018  Medication Sig  . acetaminophen (TYLENOL) 325 MG tablet Take 650 mg by mouth every 8 (eight) hours as needed.   Marland Kitchen. albuterol (VENTOLIN HFA) 108 (90 Base) MCG/ACT inhaler Inhale 2 puffs into the lungs every 6 (six) hours as needed for wheezing or shortness of breath.  . bisacodyl (DULCOLAX) 10 MG suppository Place 10 mg rectally daily as needed for moderate constipation (Constipation not relieved by MOM).  . budesonide-formoterol (SYMBICORT) 160-4.5 MCG/ACT inhaler Inhale 2 puffs into the lungs 2 (two) times daily.  . cetirizine (ZYRTEC) 10 MG tablet Take 10 mg by mouth at bedtime.  Marland Kitchen. Dextrose-Sodium Chloride (DEXTROSE 5 % AND 0.9% NACL) 5-0.9 % infusion Inject 75 mLs into the vein continuous. Via PICC line  . donepezil (ARICEPT) 10 MG tablet Take 10 mg by mouth at bedtime.  . famotidine (PEPCID) 20 MG tablet Take 20 mg by mouth at bedtime.   . fluticasone (FLONASE) 50 MCG/ACT nasal spray Place 2 sprays into both nostrils daily as needed for allergies.   Marland Kitchen. guaiFENesin (ROBITUSSIN) 100 MG/5ML liquid Take 400 mg by mouth every 6 (six) hours as needed for cough.   Marland Kitchen. HYDROcodone-acetaminophen (NORCO/VICODIN) 5-325 MG tablet Take 1 tablet by mouth every 8 (eight) hours.  Marland Kitchen. levothyroxine (SYNTHROID, LEVOTHROID) 100 MCG tablet Take 100 mcg by mouth daily before breakfast.  . magnesium hydroxide (MILK OF MAGNESIA) 400 MG/5ML suspension Take 30 mLs by mouth daily as needed for mild constipation (If no BM in 3 days).  . mineral oil enema Place 1 enema rectally once. For constipation not relieved by Dulcolax suppository  . mirtazapine (REMERON) 15 MG tablet Take 7.5 mg by mouth at bedtime. 0.5 tablet to = 7.5  . Nutritional Supplements (ENSURE CLEAR) LIQD Take 237 mLs by mouth 2 (two) times daily. Lactose-reduced Ensure  . oxybutynin (DITROPAN) 5 MG tablet Take 5 mg by mouth 3 (three) times daily.  .  potassium chloride SA (K-DUR) 20 MEQ tablet Take 20 mEq by mouth daily.  Marland Kitchen. rOPINIRole (REQUIP) 0.25 MG tablet Take 0.5 mg by mouth 2 (two) times a day. Take 2 tablets (0.5 mg)  . Skin Protectants, Misc. (EUCERIN) cream Apply 1 application topically daily. Apply to face  . Vancomycin HCl in NaCl 1-0.9 GM/200ML-% SOLN Inject 200 mLs into the vein daily. x21 days - ending 10/06/18  . venlafaxine XR (EFFEXOR-XR) 75 MG 24 hr capsule take 1 tab po daily for depression  . zinc sulfate 50 MG CAPS capsule Take 220 mg by mouth daily.  . [DISCONTINUED] aspirin 81 MG chewable tablet Chew 81 mg by mouth daily.   No facility-administered encounter medications on file as of 09/30/2018.     Review of Systems --obtained from patient as well as nurse.  General  no complaints of fever chills feeling better.  Skin does not complain of rashes itching or diaphoresis.  Head ears eyes nose mouth and throat is not complain of visual changes or sore throat.  Respiratory does not complain of cough or shortness of breath.  Cardiac does not complain of chest pain or edema.  GI does not complain of abdominal discomfort nausea vomiting diarrhea constipation.  GU no complaints of dysuria.  Musculoskeletal does not really complain of joint pain.  Neurologic continues with some confusion but does not complain of headache dizziness or syncope.  Psych positive for dementia but her confusion appears to be improving back to her baseline  does not complain of being depressed or anxious  Immunization History  Administered Date(s) Administered  . Influenza, High Dose Seasonal PF 12/20/2014  . Influenza, Seasonal, Injecte, Preservative Fre 10/10/2014  . Influenza-Unspecified 12/07/2015, 11/21/2016, 11/19/2017  . PPD Test 02/10/2016, 02/26/2016  . Pneumococcal Conjugate-13 12/20/2014  . Pneumococcal Polysaccharide-23 02/07/2017  . Td 08/26/2014   Pertinent  Health Maintenance Due  Topic Date Due  . INFLUENZA VACCINE   09/12/2018  . DEXA SCAN  Completed  . PNA vac Low Risk Adult  Completed   Fall Risk  07/31/2017 07/31/2016  Falls in the past year? No Yes  Number falls in past yr: - 2 or more  Injury with Fall? - No   Functional Status Survey:    Vitals:   09/30/18 1138  BP: 130/65  Pulse: 82  Resp: 18  Temp: 97.6 F (36.4 C)  TempSrc: Oral  Weight: 125 lb (56.7 kg)  Height: 5\' 3"  (1.6 m)   Body mass index is 22.14 kg/m. Physical Exam General this is a pleasant elderly female in no distress sitting comfortably in a wheelchair.  Her skin is warm and dry.  Eyes visual acuity appears to be intact sclera and conjunctive are clear.  Oropharynx is clear mucous membranes moist.  Chest is clear to auscultation there is no labored breathing.  Heart is regular rate and rhythm with some irregularity at times-she does not have significant edema.  Abdomen is soft nontender with positive bowel sounds.  Musculoskeletal is able to move all extremities x4 at baseline.  Neurologic is grossly intact could not really appreciate lateralizing findings her speech is clear.  Psych she is pleasant and appropriate has some confusion but pleasantly so this is improving per staff Labs reviewed:  September 29, 2018.  Sodium 141 potassium 2.7 creatinine 0.65 BUN 4.1.  Albumin was 2.6.  WBC 6.3 hemoglobin 10.6 platelets 230.   Recent Labs    09/15/18 09/16/18 09/21/18  NA 138 140 140  K 3.5 3.4 3.6  BUN 14 12 10   CREATININE 0.8 0.7 0.8   Recent Labs    09/15/18 09/16/18 09/21/18  AST 13 13 18   ALT 8 8 11   ALKPHOS 68 66 91   Recent Labs    09/06/18 09/15/18 09/16/18 09/21/18  WBC 27.3 8.5 8.2 6.3  NEUTROABS 25  --   --  4  HGB 12.3 9.8* 10.0* 11.6*  HCT 36 29* 29* 33*  PLT 508* 534* 497* 361   Lab Results  Component Value Date   TSH 3.88 12/22/2017   Lab Results  Component Value Date   HGBA1C 5.4 12/22/2017   Lab Results  Component Value Date   CHOL 185 12/22/2017   HDL 34 (A)  12/22/2017   LDLCALC 110 12/22/2017   TRIG 203 (A) 12/22/2017    Significant Diagnostic Results  in last 30 days:  No results found.  Assessment/Plan  #1 hypokalemia with potassium of 2.7 on lab yesterday this is being supplemented with 60 additional milliequivalents of potassium she continues on 20 mEq of potassium routinely daily update labs has been ordered for tomorrow- clinically she appears to be doing well  #2 history of pneumonia- with bacteremia and COVID-19 positive- blood cultures were positive for MRSA.  Continues on IV vancomycin- has been treated earlier with dexamethasone and Remdesevir..  At this point appears to be stable and doing quite well.  HSheis followed by infectious disease as well  #3 history of A. fib?-He has some slight irregularity on exam today-she is on aspirin scheduled to be discontinued today-this was discussed with Dr. Lyn HollingsheadAlexander and will extend this-her rate appears to be controlled--rate control agent was decided against in the hospital because she was hypotensive with calcium channel blockers and a fall risk-again rate at this point appears to be controlled will monitor  [id number]  3..Marland Kitchen

## 2018-10-01 DIAGNOSIS — D649 Anemia, unspecified: Secondary | ICD-10-CM | POA: Diagnosis not present

## 2018-10-01 DIAGNOSIS — I10 Essential (primary) hypertension: Secondary | ICD-10-CM | POA: Diagnosis not present

## 2018-10-01 LAB — BASIC METABOLIC PANEL
BUN: 4 (ref 4–21)
Creatinine: 0.7 (ref 0.5–1.1)
Glucose: 99
Potassium: 3.4 (ref 3.4–5.3)
Sodium: 142 (ref 137–147)

## 2018-10-05 DIAGNOSIS — D649 Anemia, unspecified: Secondary | ICD-10-CM | POA: Diagnosis not present

## 2018-10-05 DIAGNOSIS — J181 Lobar pneumonia, unspecified organism: Secondary | ICD-10-CM | POA: Diagnosis not present

## 2018-10-05 LAB — HEPATIC FUNCTION PANEL
ALT: 11 (ref 7–35)
AST: 14 (ref 13–35)
Alkaline Phosphatase: 100 (ref 25–125)
Bilirubin, Total: 0.3

## 2018-10-05 LAB — BASIC METABOLIC PANEL
BUN: 8 (ref 4–21)
Creatinine: 0.9 (ref 0.5–1.1)
Glucose: 95
Potassium: 3.8 (ref 3.4–5.3)
Sodium: 141 (ref 137–147)

## 2018-10-05 LAB — CBC AND DIFFERENTIAL
HCT: 31 — AB (ref 36–46)
Hemoglobin: 10.5 — AB (ref 12.0–16.0)
Neutrophils Absolute: 4
Platelets: 257 (ref 150–399)
WBC: 6.7

## 2018-10-09 ENCOUNTER — Non-Acute Institutional Stay (SKILLED_NURSING_FACILITY): Payer: Medicare Other | Admitting: Internal Medicine

## 2018-10-09 DIAGNOSIS — I480 Paroxysmal atrial fibrillation: Secondary | ICD-10-CM

## 2018-10-09 DIAGNOSIS — Z8639 Personal history of other endocrine, nutritional and metabolic disease: Secondary | ICD-10-CM | POA: Diagnosis not present

## 2018-10-09 DIAGNOSIS — J850 Gangrene and necrosis of lung: Secondary | ICD-10-CM

## 2018-10-11 ENCOUNTER — Encounter: Payer: Self-pay | Admitting: Internal Medicine

## 2018-10-11 NOTE — Progress Notes (Signed)
This is an acute visit.  Level care skilled.  Facility is [facility].  Chief complaint-acute visit follow-up pneumonia- need for continued labs.  History of present illness.  Patient is a pleasant 78 year old female--she recently was hospitalized secondary to being COVID positive and having pneumonia.  She did require IV vancomycin and was in the isolation unit she has returned back to her regular room and appears to be doing well.  She still has some confusion and per staff this has increased from her baseline ever since having the pneumonia otherwise they do not report any changes.  Patient is a somewhat poor historian but she does not complain of any fever chills shortness of breath and nursing has not noted this either.  She had been followed with routine labs weekly since she was on vancomycin but the vancomycin was completed earlier this week and there is a question whether these labs need to be routinely drawn.  She did have a recent history of hypokalemia but this was supplemented appears to be normalized.  Past Medical History:  Diagnosis Date  . Acute encephalopathy 02/13/2016  . Anemia   . Anxiety   . Arthritis   . Asthma   . Barrett's esophagus   . Chronic kidney disease   . COPD (chronic obstructive pulmonary disease) (HCC)   . Dementia with behavioral disturbance (HCC) 02/13/2016  . Depression   . Disease of thyroid gland   . Diverticulitis   . GERD (gastroesophageal reflux disease)   . Hyperlipemia   . Hypertension   . Memory difficulties   . Osteoporosis   . Pancreatitis   . Paroxysmal atrial fibrillation with RVR (HCC) 05/10/2016  . Psoriasis (a type of skin inflammation)   . Vertigo   . Vitamin D deficiency 08/31/2016        Past Surgical History:  Procedure Laterality Date  . BACK SURGERY    . brain shun    . CHOLECYSTECTOMY    . ROTATOR CUFF REPAIR    . SHOULDER SURGERY    . UPPER GI ENDOSCOPY  12/19/2015   [facility]  Endo, Include esophagus, stomach & duodenum or / Jejunum: Dx w/wo biopsys and dilatation; Surgeon : Leonia ReevesAlbert John Rhoton MD         Allergies  Allergen Reactions  . Azithromycin Other (See Comments)    unknown  . Cephalexin Other (See Comments)    unknown  . Cephalosporins   . Ciprofloxacin Other (See Comments)    unknown  . Darvon [Propoxyphene]   . Flagyl [Metronidazole] Other (See Comments)    unknown  . Librax [Chlordiazepoxide-Clidinium] Other (See Comments)    unknown  . Macrodantin [Nitrofurantoin Macrocrystal]   . Nitrofuran Derivatives Other (See Comments)    unknown  . Other Other (See Comments)    unknown  . Oxycodone-Acetaminophen Other (See Comments)    unknown  . Percocet [Oxycodone-Acetaminophen]   . Quetiapine Other (See Comments)    unknown Potassium level dropped.  . Sulfa Antibiotics   . Sulfamethoxazole Other (See Comments)    unknown        Outpatient Encounter Medications as of 09/30/2018  Medication Sig  . acetaminophen (TYLENOL) 325 MG tablet Take 650 mg by mouth every 8 (eight) hours as needed.   Marland Kitchen. albuterol (VENTOLIN HFA) 108 (90 Base) MCG/ACT inhaler Inhale 2 puffs into the lungs every 6 (six) hours as needed for wheezing or shortness of breath.  . bisacodyl (DULCOLAX) 10 MG suppository Place 10 mg rectally daily as  needed for moderate constipation (Constipation not relieved by MOM).  . budesonide-formoterol (SYMBICORT) 160-4.5 MCG/ACT inhaler Inhale 2 puffs into the lungs 2 (two) times daily.  . cetirizine (ZYRTEC) 10 MG tablet Take 10 mg by mouth at bedtime.  Marland Kitchen Dextrose-Sodium Chloride (DEXTROSE 5 % AND 0.9% NACL) 5-0.9 % infusion Inject 75 mLs into the vein continuous. Via PICC line  . donepezil (ARICEPT) 10 MG tablet Take 10 mg by mouth at bedtime.  . famotidine (PEPCID) 20 MG tablet Take 20 mg by mouth at bedtime.   . fluticasone (FLONASE) 50 MCG/ACT nasal spray Place 2 sprays into both nostrils daily as needed for  allergies.   Marland Kitchen guaiFENesin (ROBITUSSIN) 100 MG/5ML liquid Take 400 mg by mouth every 6 (six) hours as needed for cough.   Marland Kitchen HYDROcodone-acetaminophen (NORCO/VICODIN) 5-325 MG tablet Take 1 tablet by mouth every 8 (eight) hours.  Marland Kitchen levothyroxine (SYNTHROID, LEVOTHROID) 100 MCG tablet Take 100 mcg by mouth daily before breakfast.  . magnesium hydroxide (MILK OF MAGNESIA) 400 MG/5ML suspension Take 30 mLs by mouth daily as needed for mild constipation (If no BM in 3 days).  . mineral oil enema Place 1 enema rectally once. For constipation not relieved by Dulcolax suppository  . mirtazapine (REMERON) 15 MG tablet Take 7.5 mg by mouth at bedtime. 0.5 tablet to = 7.5  . Nutritional Supplements (ENSURE CLEAR) LIQD Take 237 mLs by mouth 2 (two) times daily. Lactose-reduced Ensure  . oxybutynin (DITROPAN) 5 MG tablet Take 5 mg by mouth 3 (three) times daily.  . potassium chloride SA (K-DUR) 20 MEQ tablet Take 20 mEq by mouth daily.  Marland Kitchen rOPINIRole (REQUIP) 0.25 MG tablet Take 0.5 mg by mouth 2 (two) times a day. Take 2 tablets (0.5 mg)  . Skin Protectants, Misc. (EUCERIN) cream Apply 1 application topically daily. Apply to face  . Vancomycin HCl in NaCl 1-0.9 GM/200ML-% SOLN Inject 200 mLs into the vein daily. x21 days - ending 10/06/18  . venlafaxine XR (EFFEXOR-XR) 75 MG 24 hr capsule take 1 tab po daily for depression  . zinc sulfate 50 MG CAPS capsule Take 220 mg by mouth daily.  . [DISCONTINUED] aspirin 81 MG chewable tablet Chew    Review of systems.  This is somewhat limited since patient is a poor historian provided by nursing as well.  General no complaints of fever or chills.  Skin is not complain of rashes itching or diaphoresis.  Head ears eyes nose mouth and throat does not complain of a sore throat or having visual changes.  Respiratory denies having of shortness of breath or increased cough-nursing says she is been essentially asymptomatic.  Cardiac does not complain of chest pain or  increased edema.  GI is not complaining of abdominal discomfort nausea vomiting diarrhea constipation.  GU is not complaining of dysuria.  Musculoskeletal no complaints of joint pain.  Neurologic does have confusion as noted above is not complaining of having a headache or syncopal episodes or dizziness.  In psych she does have a diagnosis of dementia with confusion this apparently has been somewhat persistent ever since she acquired pneumonia --apparently confusion has improved somewhat but she is not back to her previous baseline --nursing does not report any behaviors or signs of overt depression or anxiety   Physical exam.  Temperature is 97.1 pulse 88 respirations 18 blood pressure 125/77.  In general this is a pleasant elderly female she is somewhat confused but pleasant and cooperative no sign of distress.  Her skin is  warm and dry.  Eyes visual acuity appears to be intact sclera and conjunctive are clear.  Oropharynx is clear mucous membranes moist.  Chest is clear to auscultation there is no labored breathing.  Heart is regular rate and rhythm with an occasional skipped beat pulse is around 90.  She does not have significant lower extremity edema.  Abdomen is soft nontender with positive bowel sounds.  Musculoskeletal is able to move all extremities x4 it appears at relative baseline she is in a wheelchair.  Neurologic is grossly intact her speech is clear could not really appreciate lateralizing findings.  Psych she is oriented to self is confused but cooperative.  Labs.  October 05, 2018.  Sodium 141 potassium 3.8 BUN 8.2 creatinine 0.90.  Albumin was 2.7 otherwise liver function tests within normal limits.  WBC 6.7 hemoglobin 10.5 platelets 257.  Vancomycin trough was mildly elevated at 15.9.  Back on August 18 her potassium was 2.7 again this did rise up to 3.4 on August 20 and was 3.8 on August 24.  .  Assessment and plan.  1.  History of  pneumonia with bacteremia with positive blood cultures for MRSA and COVID positivity-she is back from isolation has completed vancomycin appears to be doing well in this regards--there is a question about continuing routine labs she is slated for next Monday and will have these obtained- infectious disease will need to be contacted about continuing the labs although I suspect since she is off the vancomycin and labs have shown relative stability of that they likely will be discontinued.  2.  Hypokalemia she continues on potassium 20 mEq a day it was aggressively supplemented when her potassium was 2.7 this appears to have stabilized but will await updated labs on Monday.  3.  Questionable atrial fibrillation she is on aspirin we will like to continue on this because of possible bouts of atrial fibrillation her rate appears to be controlled but this will have to be watched.  Rate control agent was decided against in the hospital because she was hypotensive with calcium channel blockers and thought to be a fall risk   [phone]

## 2018-10-12 DIAGNOSIS — J181 Lobar pneumonia, unspecified organism: Secondary | ICD-10-CM | POA: Diagnosis not present

## 2018-10-12 DIAGNOSIS — D649 Anemia, unspecified: Secondary | ICD-10-CM | POA: Diagnosis not present

## 2018-10-12 LAB — CBC AND DIFFERENTIAL
HCT: 32 — AB (ref 36–46)
Hemoglobin: 10.5 — AB (ref 12.0–16.0)
Neutrophils Absolute: 3
Platelets: 333 (ref 150–399)
WBC: 5.6

## 2018-10-12 LAB — HEPATIC FUNCTION PANEL
ALT: 29 (ref 7–35)
AST: 36 — AB (ref 13–35)
Alkaline Phosphatase: 104 (ref 25–125)
Bilirubin, Total: 7.3

## 2018-10-12 LAB — BASIC METABOLIC PANEL
BUN: 10 (ref 4–21)
Creatinine: 0.8 (ref 0.5–1.1)
Glucose: 111
Potassium: 4 (ref 3.4–5.3)
Sodium: 140 (ref 137–147)

## 2018-10-14 ENCOUNTER — Encounter: Payer: Self-pay | Admitting: Internal Medicine

## 2018-10-14 ENCOUNTER — Non-Acute Institutional Stay (SKILLED_NURSING_FACILITY): Payer: Medicare Other | Admitting: Internal Medicine

## 2018-10-14 DIAGNOSIS — J449 Chronic obstructive pulmonary disease, unspecified: Secondary | ICD-10-CM

## 2018-10-14 DIAGNOSIS — F02818 Dementia in other diseases classified elsewhere, unspecified severity, with other behavioral disturbance: Secondary | ICD-10-CM

## 2018-10-14 DIAGNOSIS — E034 Atrophy of thyroid (acquired): Secondary | ICD-10-CM | POA: Diagnosis not present

## 2018-10-14 DIAGNOSIS — J850 Gangrene and necrosis of lung: Secondary | ICD-10-CM

## 2018-10-14 DIAGNOSIS — G301 Alzheimer's disease with late onset: Secondary | ICD-10-CM | POA: Diagnosis not present

## 2018-10-14 DIAGNOSIS — U071 COVID-19: Secondary | ICD-10-CM | POA: Diagnosis not present

## 2018-10-14 DIAGNOSIS — F0281 Dementia in other diseases classified elsewhere with behavioral disturbance: Secondary | ICD-10-CM

## 2018-10-14 NOTE — Progress Notes (Signed)
Location:  [facility] Room Number: 213-W Place of Service:  SNF [phone]) Provider:  Edmon Crape, PA-C  [name], MD  Patient Care Team: [name], MD as PCP - General (Internal Medicine)  Extended Emergency Contact Information Primary Emergency Contact: [name] [phone] [name] of [place] Home Phone: [phone] Relation: Niece Secondary Emergency Contact: Dorice Lamas [place] of [place] Home Phone: [phone] Relation: Niece  Code Status:  DNR Goals of care: Advanced Directive information Advanced Directives 09/30/2018  Does Patient Have a Medical Advance Directive? Yes  Type of Advance Directive Out of facility DNR (pink MOST or yellow form)  Does patient want to make changes to medical advance directive? No - Patient declined  Copy of Healthcare Power of Attorney in Chart? -  Pre-existing out of facility DNR order (yellow form or pink MOST form) Yellow form placed in chart (order not valid for inpatient use)     Chief Complaint  Patient presents with   Medical Management of Chronic Issues    Routine [facility] visit  Female medical management of chronic medical conditions including COPD-dementia- recent history of COVID-19 positivity-recent history of pneumonia-bacteremia- possible atrial fibrillation-osteoarthritis-osteoporosis hypertension and hypothyroidism  HPI:  Pt is an 78 y.o. female seen today for medical management of chronic diseases.  As noted above. She was hospitalized a little more than a month ago for pneumonia with bacteremia-complicated with a diagnosis of COVID-19.  She was treated initially in the facility with a fever and a chest x-ray which showed a subtle right lower lobe infiltrate she also tested positive COVID-19 at the time and she was treated with Levaquin.  She however developed some increased chest pain and dyspnea with tachycardia and was admitted to the  hospital-she did have a diagnosis of atrial fibrillation with low blood pressure and tachycardia and she was febrile and white count was over 27,000.  She received a cephalosporin and Zosyn as well as vancomycin fluids and amiodarone infusion.  She actually was transferred from [facility] to [facility] because of lack of ICU beds at [facility].  Her blood cultures did grow out gram-positive cocci thought to be MRSA and she had 4 days of negative blood cultures subsequent. CT of the chest showed multifocal panlobular patchy groundglass opacity with intralobular septal thickening.  Was thought she had multifocal pneumonia but also showed a dense airspace opacity right lower lobe with cavitation that was thought to be necrotizing pneumonia.  She was treated aggressively with IV vancomycin for minimum of 4 weeks which she has completed here in the facility-.  She also was treated with REemdesiver as well as dexamethasone that she completed prior to her hospital admission.  Regards atrial fibrillation with rapid ventricular rate with hypotension this was thought secondary to her COVID disease and superimposed pneumonia.  Diltiazem was not successful so amiodarone infusion was begun and she converted to normal sinus rhythm--she was treated briefly with Lovenox for anticoagulation.  It was decided against to do aggressive rhythm control as an outpatient because of hypotension risk with calcium channel blockers she is also a fall risk.  She does continue on low-dose aspirin.  At times she does have somewhat of an irregular rate but it is regular rate and rhythm today- rate appears to be controlled.  On the lab done in mid August her potassium was down to 2.7 and this  was supplemented in fairly quickly normalized she continues on 20 mEq of potassium and last metabolic panel showed a potassium of 4 on August 31.  She does have a history of dementia she continues on  Aricept and she has had some increased confusion ever since her acute episode that prompted hospitalization-clinically she appears to be doing well but she still has some persistent increased confusion-she does have confusion at baseline with her history of dementia as well.     Currently she is sitting in her wheelchair comfortably she is pleasant somewhat confused but pleasantly so vital signs appear to be stable.     Past Medical History:  Diagnosis Date   Acute encephalopathy 02/13/2016   Anemia    Anxiety    Arthritis    Asthma    Barrett's esophagus    Chronic kidney disease    COPD (chronic obstructive pulmonary disease) (HCC)    Dementia with behavioral disturbance (HCC) 02/13/2016   Depression    Disease of thyroid gland    Diverticulitis    GERD (gastroesophageal reflux disease)    Hyperlipemia    Hypertension    Memory difficulties    Osteoporosis    Pancreatitis    Paroxysmal atrial fibrillation with RVR (HCC) 05/10/2016   Psoriasis (a type of skin inflammation)    Vertigo    Vitamin D deficiency 08/31/2016   Past Surgical History:  Procedure Laterality Date   BACK SURGERY     brain shun     CHOLECYSTECTOMY     ROTATOR CUFF REPAIR     SHOULDER SURGERY     UPPER GI ENDOSCOPY  12/19/2015   [facility] Endo, Include esophagus, stomach & duodenum or / Jejunum: Dx w/wo biopsys and dilatation; Surgeon : Leonia ReevesAlbert John Rhoton MD    Allergies  Allergen Reactions   Azithromycin Other (See Comments)    unknown   Cephalexin Other (See Comments)    unknown   Cephalosporins    Ciprofloxacin Other (See Comments)    unknown   Darvon [Propoxyphene]    Flagyl [Metronidazole] Other (See Comments)    unknown   Librax [Chlordiazepoxide-Clidinium] Other (See Comments)    unknown   Macrodantin [Nitrofurantoin Macrocrystal]    Nitrofuran Derivatives Other (See Comments)    unknown   Other Other (See Comments)    unknown    Oxycodone-Acetaminophen Other (See Comments)    unknown   Percocet [Oxycodone-Acetaminophen]    Quetiapine Other (See Comments)    unknown Potassium level dropped.   Sulfa Antibiotics    Sulfamethoxazole Other (See Comments)    unknown    Outpatient Encounter Medications as of 10/14/2018  Medication Sig   acetaminophen (TYLENOL) 325 MG tablet Take 650 mg by mouth every 8 (eight) hours as needed.    albuterol (VENTOLIN HFA) 108 (90 Base) MCG/ACT inhaler Inhale 2 puffs into the lungs every 6 (six) hours as needed for wheezing or shortness of breath.   aspirin 81 MG chewable tablet Chew 81 mg by mouth daily.   bisacodyl (DULCOLAX) 10 MG suppository Place 10 mg rectally daily as needed for moderate constipation (Constipation not relieved by MOM).   budesonide-formoterol (SYMBICORT) 160-4.5 MCG/ACT inhaler Inhale 2 puffs into the lungs 2 (two) times daily.   cetirizine (ZYRTEC) 10 MG tablet Take 10 mg by mouth at bedtime.   donepezil (ARICEPT) 10 MG tablet Take 10 mg by mouth at bedtime.   famotidine (PEPCID) 20 MG tablet Take 20 mg by mouth at bedtime.    fluticasone (  FLONASE) 50 MCG/ACT nasal spray Place 2 sprays into both nostrils daily as needed for allergies.    guaiFENesin (ROBITUSSIN) 100 MG/5ML liquid Take 400 mg by mouth every 6 (six) hours as needed for cough.    HYDROcodone-acetaminophen (NORCO/VICODIN) 5-325 MG tablet Take 1 tablet by mouth every 8 (eight) hours.   levothyroxine (SYNTHROID, LEVOTHROID) 100 MCG tablet Take 100 mcg by mouth daily before breakfast.   magnesium hydroxide (MILK OF MAGNESIA) 400 MG/5ML suspension Take 30 mLs by mouth daily as needed for mild constipation (If no BM in 3 days).   mineral oil enema Place 1 enema rectally once. For constipation not relieved by Dulcolax suppository   mirtazapine (REMERON) 15 MG tablet Take 7.5 mg by mouth at bedtime. 0.5 tablet to = 7.5   Nutritional Supplements (ENSURE CLEAR) LIQD Take 237 mLs by mouth 2  (two) times daily. Lactose-reduced Ensure   oxybutynin (DITROPAN) 5 MG tablet Take 5 mg by mouth 3 (three) times daily.   potassium chloride SA (K-DUR) 20 MEQ tablet Take 20 mEq by mouth daily.   rOPINIRole (REQUIP) 0.5 MG tablet Take 0.5 mg by mouth 2 (two) times daily.   Skin Protectants, Misc. (EUCERIN) cream Apply 1 application topically daily. Apply to face   venlafaxine XR (EFFEXOR-XR) 75 MG 24 hr capsule take 1 tab po daily for depression   [DISCONTINUED] Dextrose-Sodium Chloride (DEXTROSE 5 % AND 0.9% NACL) 5-0.9 % infusion Inject 75 mLs into the vein continuous. Via PICC line   [DISCONTINUED] rOPINIRole (REQUIP) 0.25 MG tablet Take 0.5 mg by mouth 2 (two) times a day. Take 2 tablets (0.5 mg)   [DISCONTINUED] zinc sulfate 50 MG CAPS capsule Take 220 mg by mouth daily.   No facility-administered encounter medications on file as of 10/14/2018.     Review of Systems   This is somewhat limited secondary to dementia.  In general she does not complain of feeling like she has a fever or chills.  Skin no complaints of itching or rashes.  Head ears eyes nose mouth and throat does not complain of a sore throat or difficulty swallowing or visual changes.  Respiratory has an extensive history here previously but this appears to have largely stabilized she is not complaining of a cough or shortness of breath.  Cardiac she is not complaining of chest pain or edema.  GU no complaints of dysuria.  Musculoskeletal does not complain of joint pain.  Neurologic continues again with some confusion but is not complaining of feeling dizzy or having a headache or feeling faint.  Psych she does have a diagnosis of dementia with confusion as noted above appears somewhat increased from her baseline ever since her acute episode  Immunization History  Administered Date(s) Administered   Influenza, High Dose Seasonal PF 12/20/2014   Influenza, Seasonal, Injecte, Preservative Fre 10/10/2014     Influenza-Unspecified 12/07/2015, 11/21/2016, 11/19/2017   PPD Test 02/10/2016, 02/26/2016   Pneumococcal Conjugate-13 12/20/2014   Pneumococcal Polysaccharide-23 02/07/2017   Td 08/26/2014   Pertinent  Health Maintenance Due  Topic Date Due   INFLUENZA VACCINE  09/12/2018   DEXA SCAN  Completed   PNA vac Low Risk Adult  Completed   Fall Risk  07/31/2017 07/31/2016  Falls in the past year? No Yes  Number falls in past yr: - 2 or more  Injury with Fall? - No   Functional Status Survey:    Vitals:   10/14/18 1115  BP: 130/71  Pulse: 65  Resp: 18  Temp:  98.8 F (37.1 C)  TempSrc: Oral  Weight: 124 lb 3.2 oz (56.3 kg)  Height: 5\' 3"  (1.6 m)   Body mass index is 22 kg/m. Physical Exam   In general this is a pleasant elderly female in no distress sitting comfortably in her wheelchair.  Her skin is warm and dry.  Eyes visual acuity appears grossly intact sclera and conjunctive are clear.  Oropharynx is clear mucous membranes moist.  Chest is clear to auscultation with somewhat shallow air entry there is no labored breathing.  Heart is regular rate and rhythm without murmur gallop or rub she does not have significant lower extremity edema.  Abdomen is soft nontender with positive bowel sounds.  Musculoskeletal moves all extremities x4 at baseline Ambulates in wheelchair.  Neurologic is grossly intact her speech is clear cranial nerves appear grossly intact could not appreciate lateralizing findings.  Psych she is oriented to self she is pleasant follows simple verbal commands- still has some confusion however \  Labs reviewed:  October 12, 2018.  Sodium 140 potassium 4 BUN 10.4 platelets 0.79.  Albumin 3.3-AST 36 otherwise liver function tests within normal limits  WBC 5.6 hemoglobin 10.5 platelets 333.   Recent Labs    09/29/18 10/01/18 10/05/18  NA 141 142 141  K 2.7* 3.4 3.8  BUN 4 4 8   CREATININE 0.7 0.7 0.9   Recent Labs    09/21/18  09/29/18 10/05/18  AST 18 13 14   ALT 11 11 11   ALKPHOS 91 92 100   Recent Labs    09/21/18 09/29/18 10/05/18  WBC 6.3 6.3 6.7  NEUTROABS 4 4 4   HGB 11.6* 10.6* 10.5*  HCT 33* 32* 31*  PLT 361 230 257   Lab Results  Component Value Date   TSH 3.88 12/22/2017   Lab Results  Component Value Date   HGBA1C 5.4 12/22/2017   Lab Results  Component Value Date   CHOL 185 12/22/2017   HDL 34 (A) 12/22/2017   LDLCALC 110 12/22/2017   TRIG 203 (A) 12/22/2017    Significant Diagnostic Results in last 30 days:  No results found.  Assessment/Plan  #1 history of COVID-19 complicated with pneumonia and bacteremia- she did receive IV antibiotics and completed her IV vancomycin here at the facility- she also previously receivedRemdesevir as well as dexamethasone- she continues to be stable and has made a nice recovery respiratory wise.  She has albuterol as needed as well as give Satteson.  She also has orders for Symbicort- s   #2 history of COPD as noted above she does continue on albuterol as needed also has orders for Symbicort and guaifenesin this appears to be stable.  3.  History of dementia-she continues on Aricept she has had some increased confusion ever since her hospitalization- at this point will monitor and continue supportive care.  It also appears she is lost some weight nursing staff is addressing this I suspect the hospitalization led to some significant weight loss but this will have to be monitored.--She is on Remeron with hopes for appetite stimulation Per chart review it appears she is lost at least 10 pounds since her hospitalization although there may be some scale variability She is on supplementation including Ensure staff is monitoring this--last albumin was 3.3 which was up from 2.7 the previous week   4.-  History of questionable atrial fibrillation- she is only on aspirin for anticoagulation again thought not to be a candidate for aggressive therapy with  her fall risk.  She is not on a rate control agent apparently she does not do well with calcium channel blockers which caused hypotension-nonetheless her rate appears to be controlled occasionally she will have some irregular beats on exam but clinically appears to be stable in this regards.  5.-  History of hypothyroidism she continues on Synthroid  most recent TSH appeared to be within normal limits at 3.88.  6.  History of overactive bladder she is on oxybutynin 5 mg 3 times a day this appears to be stable she does not complain of dysuria or increased frequency today-nursing is not noted this to my knowledge.  7.  History of hypokalemia she does have some history of this she is on potassium 20 mEq a day at one point potassium was down to 2.7 this was supplemented and she normalized fairly quickly she has been stable now on 20 mEq of potassium will monitor this periodically last potassium was 4.0 earlier this week on August 31.  8.-  History of depression and anxiety she is on Effexor 75 mg a day at one point she had an order for clonazepam but this has been discontinued after 14 days she appears to be stable in this regards.  Although her confusion persists  9.  History of allergic rhinitis she does continue on Zyrtec routinely she also has a PRN Flonase order this appears to be stable.  10.-  History of restless legs she continues on Requip twice a day   [id number]==--of note greater than 35 minutes spent assessing patient reviewing her chart and labs discussing her status with nursing and coordinating and formulating a plan of care for numerous diagnoses- of note greater than 50% of time spent coordinating a plan of care with input as noted above

## 2018-10-18 DIAGNOSIS — J850 Gangrene and necrosis of lung: Secondary | ICD-10-CM | POA: Diagnosis not present

## 2018-10-18 DIAGNOSIS — R2681 Unsteadiness on feet: Secondary | ICD-10-CM | POA: Diagnosis not present

## 2018-10-18 DIAGNOSIS — M6281 Muscle weakness (generalized): Secondary | ICD-10-CM | POA: Diagnosis not present

## 2018-10-19 DIAGNOSIS — J850 Gangrene and necrosis of lung: Secondary | ICD-10-CM | POA: Diagnosis not present

## 2018-10-19 DIAGNOSIS — R2681 Unsteadiness on feet: Secondary | ICD-10-CM | POA: Diagnosis not present

## 2018-10-19 DIAGNOSIS — M6281 Muscle weakness (generalized): Secondary | ICD-10-CM | POA: Diagnosis not present

## 2018-10-20 DIAGNOSIS — M6281 Muscle weakness (generalized): Secondary | ICD-10-CM | POA: Diagnosis not present

## 2018-10-20 DIAGNOSIS — R2681 Unsteadiness on feet: Secondary | ICD-10-CM | POA: Diagnosis not present

## 2018-10-20 DIAGNOSIS — J850 Gangrene and necrosis of lung: Secondary | ICD-10-CM | POA: Diagnosis not present

## 2018-10-21 ENCOUNTER — Non-Acute Institutional Stay (SKILLED_NURSING_FACILITY): Payer: Medicare Other | Admitting: Internal Medicine

## 2018-10-21 ENCOUNTER — Encounter: Payer: Self-pay | Admitting: Internal Medicine

## 2018-10-21 DIAGNOSIS — R410 Disorientation, unspecified: Secondary | ICD-10-CM | POA: Diagnosis not present

## 2018-10-21 DIAGNOSIS — M6281 Muscle weakness (generalized): Secondary | ICD-10-CM | POA: Diagnosis not present

## 2018-10-21 DIAGNOSIS — J850 Gangrene and necrosis of lung: Secondary | ICD-10-CM | POA: Diagnosis not present

## 2018-10-21 DIAGNOSIS — E876 Hypokalemia: Secondary | ICD-10-CM | POA: Diagnosis not present

## 2018-10-21 DIAGNOSIS — Z8701 Personal history of pneumonia (recurrent): Secondary | ICD-10-CM

## 2018-10-21 DIAGNOSIS — I480 Paroxysmal atrial fibrillation: Secondary | ICD-10-CM

## 2018-10-21 DIAGNOSIS — R2681 Unsteadiness on feet: Secondary | ICD-10-CM | POA: Diagnosis not present

## 2018-10-21 NOTE — Progress Notes (Signed)
Location: [facility] Room Number: 213-W Place of Service:  SNF [phone]) Provider:  Henreitta Leber, MD  Patient Care Team: Hennie Duos, MD as PCP - General (Internal Medicine)  Extended Emergency Contact Information Primary Emergency Contact: Jerral Bonito [phone] Johnnette Litter of [place] Phone: [phone] Relation: Niece Secondary Emergency Contact: Karen Chafe States of [place] Phone: [phone] Relation: Niece  Code Status:  DNR Goals of care: Advanced Directive information Advanced Directives 10/14/2018  Does Patient Have a Medical Advance Directive? Yes  Type of Advance Directive Out of facility DNR (pink MOST or yellow form)  Does patient want to make changes to medical advance directive? No - Guardian declined  Copy of [place] in Chart? -  Pre-existing out of facility DNR order (yellow form or pink MOST form) Yellow form placed in chart (order not valid for inpatient use)     Chief Complaint  Patient presents with  . Acute Visit    Patient is seen secondary to confusion.    HPI:  Pt is an 78 y.o. female seen today for an acute visit for apparently some increased confusion.  Patient is a long-term resident of facility and has a diagnosis of COPD as well as dementia recent history of COVID-19 positivity with recent history of necrotizing pneumonia and bacteremia-history of possible atrial fibrillation as well as osteoarthritis osteoporosis hypertension hypothyroidism on chronic Synthroid.  Apparently this morning she had some increased confusion and wandered off in the hallway looking to use the bathroom this was unusual for her.  She does have a history of dementia with confusion which appears to have progressed ever since she was hospitalized with her respiratory issues and bacteremia.  Currently she is sitting comfortably on the side of her bed she is pleasant and  appropriate although confused- she reports no dysuria or shortness of breath or cough or feeling ill.  Vital signs appear to be stable she is afebrile.     Past Medical History:  Diagnosis Date  . Acute encephalopathy 02/13/2016  . Anemia   . Anxiety   . Arthritis   . Asthma   . Barrett's esophagus   . Chronic kidney disease   . COPD (chronic obstructive pulmonary disease) ([place])   . Dementia with behavioral disturbance ([place]) 02/13/2016  . Depression   . Disease of thyroid gland   . Diverticulitis   . GERD (gastroesophageal reflux disease)   . Hyperlipemia   . Hypertension   . Memory difficulties   . Osteoporosis   . Pancreatitis   . Paroxysmal atrial fibrillation with RVR ([place]) 05/10/2016  . Psoriasis (a type of skin inflammation)   . Vertigo   . Vitamin D deficiency 08/31/2016   Past Surgical History:  Procedure Laterality Date  . BACK SURGERY    . brain shun    . CHOLECYSTECTOMY    . ROTATOR CUFF REPAIR    . SHOULDER SURGERY    . UPPER GI ENDOSCOPY  12/19/2015   [facility] Endo, Include esophagus, stomach & duodenum or / Jejunum: Dx w/wo biopsys and dilatation; Surgeon : Cristie Hem Rhoton MD    Allergies  Allergen Reactions  . Azithromycin Other (See Comments)    unknown  . Cephalexin Other (See Comments)    unknown  . Cephalosporins   . Ciprofloxacin Other (See Comments)    unknown  . Darvon [Propoxyphene]   . Flagyl [  Metronidazole] Other (See Comments)    unknown  . Librax [Chlordiazepoxide-Clidinium] Other (See Comments)    unknown  . Macrodantin [Nitrofurantoin Macrocrystal]   . Nitrofuran Derivatives Other (See Comments)    unknown  . Other Other (See Comments)    unknown  . Oxycodone-Acetaminophen Other (See Comments)    unknown  . Percocet [Oxycodone-Acetaminophen]   . Quetiapine Other (See Comments)    unknown Potassium level dropped.  . Sulfa Antibiotics   . Sulfamethoxazole Other (See Comments)    unknown    Outpatient Encounter Medications as  of 10/21/2018  Medication Sig  . acetaminophen (TYLENOL) 325 MG tablet Take 650 mg by mouth every 8 (eight) hours as needed.   Marland Kitchen. albuterol (VENTOLIN HFA) 108 (90 Base) MCG/ACT inhaler Inhale 2 puffs into the lungs every 6 (six) hours as needed for wheezing or shortness of breath.  Marland Kitchen. aspirin 81 MG chewable tablet Chew 81 mg by mouth daily.  . bisacodyl (DULCOLAX) 10 MG suppository Place 10 mg rectally daily as needed for moderate constipation (Constipation not relieved by MOM).  . budesonide-formoterol (SYMBICORT) 160-4.5 MCG/ACT inhaler Inhale 2 puffs into the lungs 2 (two) times daily.  . cetirizine (ZYRTEC) 10 MG tablet Take 10 mg by mouth at bedtime.  . donepezil (ARICEPT) 10 MG tablet Take 10 mg by mouth at bedtime.  . famotidine (PEPCID) 20 MG tablet Take 20 mg by mouth at bedtime.   . fluticasone (FLONASE) 50 MCG/ACT nasal spray Place 2 sprays into both nostrils daily as needed for allergies.   Marland Kitchen. guaiFENesin (ROBITUSSIN) 100 MG/5ML liquid Take 400 mg by mouth every 6 (six) hours as needed for cough.   Marland Kitchen. HYDROcodone-acetaminophen (NORCO/VICODIN) 5-325 MG tablet Take 1 tablet by mouth every 8 (eight) hours.  Marland Kitchen. levothyroxine (SYNTHROID, LEVOTHROID) 100 MCG tablet Take 100 mcg by mouth daily before breakfast.  . magnesium hydroxide (MILK OF MAGNESIA) 400 MG/5ML suspension Take 30 mLs by mouth daily as needed for mild constipation (If no BM in 3 days).  . mineral oil enema Place 1 enema rectally once. For constipation not relieved by Dulcolax suppository  . mirtazapine (REMERON) 15 MG tablet Take 7.5 mg by mouth at bedtime. 0.5 tablet to = 7.5  . Nutritional Supplements (ENSURE CLEAR) LIQD Take 237 mLs by mouth 2 (two) times daily. Lactose-reduced Ensure  . oxybutynin (DITROPAN) 5 MG tablet Take 5 mg by mouth 3 (three) times daily.  . potassium chloride SA (K-DUR) 20 MEQ tablet Take 20 mEq by mouth daily.  Marland Kitchen. rOPINIRole (REQUIP) 0.5 MG tablet Take 0.5 mg by mouth 2 (two) times daily.  . Skin  Protectants, Misc. (EUCERIN) cream Apply 1 application topically daily. Apply to face  . venlafaxine XR (EFFEXOR-XR) 75 MG 24 hr capsule take 1 tab po daily for depression   No facility-administered encounter medications on file as of 10/21/2018.     Review of Systems is a somewhat limited secondary to dementia provided by nursing as well.  General no complaints of fever or chills.  Skin does not complain of rashes itching or diaphoresis.  Head ears eyes nose mouth and throat is not complain of visual changes or sore throat.  Respiratory does not complain of shortness of breath or cough.  Cardiac is not complaining of chest pain or edema or swelling.   GI is not complaining of abdominal pain nausea vomiting diarrhea constipation  GU she is not complaining of any burning or dysuria.  Musculoskeletal does not complain of joint pain or increased  weakness from baseline.  Neurologic is not complaining of feeling dizzy or having a headache or feeling syncopal.  .  Psych she does not complain of being depressed or anxious-nursing has noted some increased confusion however especially since returning from the hospital-and apparently today he has some increased confusion wandering in the hallway  earlier   Immunization History  Administered Date(s) Administered  . Influenza, High Dose Seasonal PF 12/20/2014  . Influenza, Seasonal, Injecte, Preservative Fre 10/10/2014  . Influenza-Unspecified 12/07/2015, 11/21/2016, 11/19/2017  . PPD Test 02/10/2016, 02/26/2016  . Pneumococcal Conjugate-13 12/20/2014  . Pneumococcal Polysaccharide-23 02/07/2017  . Td 08/26/2014   Pertinent  Health Maintenance Due  Topic Date Due  . INFLUENZA VACCINE  09/12/2018  . DEXA SCAN  Completed  . PNA vac Low Risk Adult  Completed   Fall Risk  07/31/2017 07/31/2016  Falls in the past year? No Yes  Number falls in past yr: - 2 or more  Injury with Fall? - No   Functional Status Survey:    Vitals:    10/21/18 1410  BP: 124/64  Pulse: 65  Resp: 18  Temp: (!) 97.5 F (36.4 C)  TempSrc: Oral  Weight: 122 lb (55.3 kg)  Height: 5\' 3"  (1.6 m)   Body mass index is 21.61 kg/m. Physical Exam   General this is a very pleasant elderly female in no distress sitting comfortably on the side of her bed.  Her skin is warm and dry.  Eyes visual acuity appears to be intact sclera and conjunctive are clear pupils appear to be equal   Oropharynx is clear mucous membranes moist tongue is midline.  Chest is clear to auscultation with somewhat shallow air entry there is no labored breathing.  Heart is regular rate and rhythm without murmur gallop or rub without significant lower extremity edema.  Abdomen is soft nontender with positive bowel sounds.  GU could not really appreciate any suprapubic tenderness.  Musculoskeletal does move all extremities x4 at baseline strength appears to be intact and unchanged all 4 extremities.  Neurologic is grossly intact her speech is clear cannot really appreciate any lateralizing findings cranial nerves appear grossly intact.  Psych she is oriented to self she does follow simple verbal commands pleasantly- she could not really name the President of the [place] or what day it was.  She was able to tell me her age and birthdate correctly     Labs reviewed:  October 12, 2018.  Sodium 140 potassium 4 BUN 10.4 creatinine 0.79.  Albumin 3.3.  AST 36.  WBC 5.6 hemoglobin 10.5 platelets 333.   Recent Labs    09/29/18 10/01/18 10/05/18  NA 141 142 141  K 2.7* 3.4 3.8  BUN 4 4 8   CREATININE 0.7 0.7 0.9   Recent Labs    09/21/18 09/29/18 10/05/18  AST 18 13 14   ALT 11 11 11   ALKPHOS 91 92 100   Recent Labs    09/21/18 09/29/18 10/05/18  WBC 6.3 6.3 6.7  NEUTROABS 4 4 4   HGB 11.6* 10.6* 10.5*  HCT 33* 32* 31*  PLT 361 230 257   Lab Results  Component Value Date   TSH 3.88 12/22/2017   Lab Results  Component Value Date    HGBA1C 5.4 12/22/2017   Lab Results  Component Value Date   CHOL 185 12/22/2017   HDL 34 (A) 12/22/2017   LDLCALC 110 12/22/2017   TRIG 203 (A) 12/22/2017    Significant Diagnostic Results in last  30 days:  No results found.  Assessment/Plan  #1 confusion-per staff she has had some baseline increased confusion since her hospitalization but today was actually wandering the hall and appeared more confused than usual- clinically she appears to be stable vital signs are stable she seems to be herself.  A urinalysis and culture has been obtained also will update a TSH I do note she is on Synthroid with a history of hypothyroidism and also will update her electrolytes and a CBC.  2.  History of COVID 19 complicated with pneumonia and bacteremia she was treated aggressively in the hospital with IV antibiotics and completed a course of vancomycin here in the facility- she also receivedRemdesevir  as well as dexamethasone  She appears to be doing well in this regards but apparently still has some increased confusion after these acute episodes.  She does continue on albuterol as needed as well as Symbicort and guaifenesin.  3.  History of questionable A. fib she is on aspirin for anticoagulation this appears to be rate controlled thought not to be an aggressive candidate because of her fall risk and had issues with a calcium channel blocker causing hypotension when she was in the hospital.  Heart rate and rhythm appears to be regular today.  4.  History of hypokalemia at one point her potassium was 2.7 this was while she was receiving antibiotic this was supplemented and quickly normalized she continues on routine 20 mEq potassium-we will update a BMP.  [id number]

## 2018-10-22 DIAGNOSIS — N390 Urinary tract infection, site not specified: Secondary | ICD-10-CM | POA: Diagnosis not present

## 2018-10-22 DIAGNOSIS — E039 Hypothyroidism, unspecified: Secondary | ICD-10-CM | POA: Diagnosis not present

## 2018-10-22 DIAGNOSIS — R319 Hematuria, unspecified: Secondary | ICD-10-CM | POA: Diagnosis not present

## 2018-10-22 DIAGNOSIS — D649 Anemia, unspecified: Secondary | ICD-10-CM | POA: Diagnosis not present

## 2018-10-22 DIAGNOSIS — I10 Essential (primary) hypertension: Secondary | ICD-10-CM | POA: Diagnosis not present

## 2018-10-22 LAB — BASIC METABOLIC PANEL
BUN: 14 (ref 4–21)
Creatinine: 0.8 (ref 0.5–1.1)
Glucose: 97
Potassium: 4.8 (ref 3.4–5.3)
Sodium: 138 (ref 137–147)

## 2018-10-22 LAB — CBC AND DIFFERENTIAL
HCT: 31 — AB (ref 36–46)
Hemoglobin: 10.4 — AB (ref 12.0–16.0)
Neutrophils Absolute: 3
Platelets: 235 (ref 150–399)
WBC: 5.2

## 2018-10-22 LAB — TSH: TSH: 0.52 (ref 0.41–5.90)

## 2018-10-23 DIAGNOSIS — J850 Gangrene and necrosis of lung: Secondary | ICD-10-CM | POA: Diagnosis not present

## 2018-10-23 DIAGNOSIS — M6281 Muscle weakness (generalized): Secondary | ICD-10-CM | POA: Diagnosis not present

## 2018-10-23 DIAGNOSIS — R2681 Unsteadiness on feet: Secondary | ICD-10-CM | POA: Diagnosis not present

## 2018-10-24 DIAGNOSIS — J850 Gangrene and necrosis of lung: Secondary | ICD-10-CM | POA: Diagnosis not present

## 2018-10-24 DIAGNOSIS — M6281 Muscle weakness (generalized): Secondary | ICD-10-CM | POA: Diagnosis not present

## 2018-10-24 DIAGNOSIS — R2681 Unsteadiness on feet: Secondary | ICD-10-CM | POA: Diagnosis not present

## 2018-10-25 DIAGNOSIS — R2681 Unsteadiness on feet: Secondary | ICD-10-CM | POA: Diagnosis not present

## 2018-10-25 DIAGNOSIS — J850 Gangrene and necrosis of lung: Secondary | ICD-10-CM | POA: Diagnosis not present

## 2018-10-25 DIAGNOSIS — M6281 Muscle weakness (generalized): Secondary | ICD-10-CM | POA: Diagnosis not present

## 2018-10-26 DIAGNOSIS — J850 Gangrene and necrosis of lung: Secondary | ICD-10-CM | POA: Diagnosis not present

## 2018-10-26 DIAGNOSIS — R2681 Unsteadiness on feet: Secondary | ICD-10-CM | POA: Diagnosis not present

## 2018-10-26 DIAGNOSIS — M6281 Muscle weakness (generalized): Secondary | ICD-10-CM | POA: Diagnosis not present

## 2018-10-27 DIAGNOSIS — M6281 Muscle weakness (generalized): Secondary | ICD-10-CM | POA: Diagnosis not present

## 2018-10-27 DIAGNOSIS — J850 Gangrene and necrosis of lung: Secondary | ICD-10-CM | POA: Diagnosis not present

## 2018-10-27 DIAGNOSIS — R2681 Unsteadiness on feet: Secondary | ICD-10-CM | POA: Diagnosis not present

## 2018-11-03 ENCOUNTER — Other Ambulatory Visit: Payer: Self-pay | Admitting: Internal Medicine

## 2018-11-03 MED ORDER — HYDROCODONE-ACETAMINOPHEN 5-325 MG PO TABS: 1.0000 | ORAL_TABLET | Freq: Three times a day (TID) | ORAL | 0 refills | Status: DC

## 2018-11-13 LAB — VITAMIN D 25 HYDROXY (VIT D DEFICIENCY, FRACTURES): Vit D, 25-Hydroxy: 39.74

## 2018-12-01 ENCOUNTER — Encounter: Payer: Self-pay | Admitting: Internal Medicine

## 2018-12-01 ENCOUNTER — Non-Acute Institutional Stay (SKILLED_NURSING_FACILITY): Payer: Medicare Other | Admitting: Internal Medicine

## 2018-12-01 DIAGNOSIS — K22719 Barrett's esophagus with dysplasia, unspecified: Secondary | ICD-10-CM

## 2018-12-01 DIAGNOSIS — J449 Chronic obstructive pulmonary disease, unspecified: Secondary | ICD-10-CM

## 2018-12-01 DIAGNOSIS — I10 Essential (primary) hypertension: Secondary | ICD-10-CM

## 2018-12-01 NOTE — Progress Notes (Signed)
Location:  [facility] Room Number: 213-W Place of Service:  SNF (31)  [name], MD  Patient Care Team: [name], MD as PCP - General (Internal Medicine)  Extended Emergency Contact Information Primary Emergency Contact: [name] [phone] [name] of [place] Home Phone: [phone] Relation: Niece Secondary Emergency Contact: Dorice Lamas States of [place] Home Phone: [phone] Relation: Niece    Allergies: Azithromycin, Cephalexin, Cephalosporins, Ciprofloxacin, Darvon [propoxyphene], Flagyl [metronidazole], Librax [chlordiazepoxide-clidinium], Macrodantin [nitrofurantoin macrocrystal], Nitrofuran derivatives, Other, Oxycodone-acetaminophen, Percocet [oxycodone-acetaminophen], Quetiapine, Sulfa antibiotics, and Sulfamethoxazole  Chief Complaint  Patient presents with  . Medical Management of Chronic Issues    Routine [facility] visit    HPI: Patient is an 78 y.o. female who is being seen for routine issues of hypertension, COPD, and Barrett's esophagus.   Past Medical History:  Diagnosis Date  . Acute encephalopathy 02/13/2016  . Anemia   . Anxiety   . Arthritis   . Asthma   . Barrett's esophagus   . Chronic kidney disease   . COPD (chronic obstructive pulmonary disease) ([facility])   . Dementia with behavioral disturbance ([facility]) 02/13/2016  . Depression   . Disease of thyroid gland   . Diverticulitis   . GERD (gastroesophageal reflux disease)   . Hyperlipemia   . Hypertension   . Memory difficulties   . Osteoporosis   . Pancreatitis   . Paroxysmal atrial fibrillation with RVR ([facility]) 05/10/2016  . Psoriasis (a type of skin inflammation)   . Vertigo   . Vitamin D deficiency 08/31/2016    Past Surgical History:  Procedure Laterality Date  . BACK SURGERY    . brain shun    . CHOLECYSTECTOMY    . ROTATOR CUFF REPAIR    . SHOULDER SURGERY    . UPPER GI ENDOSCOPY  12/19/2015   [facility]  Endo, Include esophagus, stomach & duodenum or / Jejunum: Dx w/wo biopsys and dilatation; Surgeon : Leonia Reeves Rhoton MD    Allergies as of 12/01/2018      Reactions   Azithromycin Other (See Comments)   unknown   Cephalexin Other (See Comments)   unknown   Cephalosporins    Ciprofloxacin Other (See Comments)   unknown   Darvon [propoxyphene]    Flagyl [metronidazole] Other (See Comments)   unknown   Librax [chlordiazepoxide-clidinium] Other (See Comments)   unknown   Macrodantin [nitrofurantoin Macrocrystal]    Nitrofuran Derivatives Other (See Comments)   unknown   Other Other (See Comments)   unknown   Oxycodone-acetaminophen Other (See Comments)   unknown   Percocet [oxycodone-acetaminophen]    Quetiapine Other (See Comments)   unknown Potassium level dropped.   Sulfa Antibiotics    Sulfamethoxazole Other (See Comments)   unknown      Medication List       Accurate as of December 01, 2018 11:59 PM. If you have any questions, ask your nurse or doctor.        acetaminophen 325 MG tablet Commonly known as: TYLENOL Take 650 mg by mouth every 8 (eight) hours as needed.   albuterol 108 (90 Base) MCG/ACT inhaler Commonly known as: VENTOLIN HFA Inhale 2 puffs into the lungs every 6 (six) hours as needed for wheezing or shortness of breath.   aspirin 81 MG chewable tablet Chew 81 mg by mouth daily.   bisacodyl 10 MG suppository Commonly known as: DULCOLAX Place 10 mg  rectally daily as needed for moderate constipation (Constipation not relieved by MOM).   budesonide-formoterol 160-4.5 MCG/ACT inhaler Commonly known as: SYMBICORT Inhale 2 puffs into the lungs 2 (two) times daily.   cetirizine 10 MG tablet Commonly known as: ZYRTEC Take 10 mg by mouth at bedtime.   donepezil 10 MG tablet Commonly known as: ARICEPT Take 10 mg by mouth at bedtime.   ENSURE ENLIVE PO Take 237 mLs by mouth 2 (two) times daily. What changed: Another medication with the same  name was removed. Continue taking this medication, and follow the directions you see here. Changed by: Merrilee SeashoreAnne Cornellius Kropp, MD   eucerin cream Apply 1 application topically daily. Apply to face   famotidine 20 MG tablet Commonly known as: PEPCID Take 20 mg by mouth at bedtime.   fluticasone 50 MCG/ACT nasal spray Commonly known as: FLONASE Place 2 sprays into both nostrils daily as needed for allergies.   guaiFENesin 100 MG/5ML liquid Commonly known as: ROBITUSSIN Take 400 mg by mouth every 6 (six) hours as needed for cough.   HYDROcodone-acetaminophen 5-325 MG tablet Commonly known as: NORCO/VICODIN Take 1 tablet by mouth every 8 (eight) hours.   levothyroxine 100 MCG tablet Commonly known as: SYNTHROID Take 100 mcg by mouth daily before breakfast.   magnesium hydroxide 400 MG/5ML suspension Commonly known as: MILK OF MAGNESIA Take 30 mLs by mouth daily as needed for mild constipation (If no BM in 3 days).   mineral oil enema Place 1 enema rectally once. For constipation not relieved by Dulcolax suppository   mirtazapine 15 MG tablet Commonly known as: REMERON Take 7.5 mg by mouth at bedtime. 0.5 tablet to = 7.5   oxybutynin 5 MG tablet Commonly known as: DITROPAN Take 5 mg by mouth 3 (three) times daily.   potassium chloride SA 20 MEQ tablet Commonly known as: KLOR-CON Take 20 mEq by mouth daily.   rOPINIRole 0.5 MG tablet Commonly known as: REQUIP Take 0.5 mg by mouth 2 (two) times daily.   venlafaxine XR 75 MG 24 hr capsule Commonly known as: EFFEXOR-XR take 1 tab po daily for depression       No orders of the defined types were placed in this encounter.   Immunization History  Administered Date(s) Administered  . Influenza, High Dose Seasonal PF 12/20/2014  . Influenza, Seasonal, Injecte, Preservative Fre 10/10/2014  . Influenza-Unspecified 11/21/2016, 11/19/2017, 11/14/2018  . PPD Test 02/10/2016, 02/26/2016  . Pneumococcal Conjugate-13 12/20/2014  .  Pneumococcal Polysaccharide-23 02/07/2017  . Td 08/26/2014    Social History   Tobacco Use  . Smoking status: Former Smoker    Quit date: 12/17/1990    Years since quitting: 27.9  . Smokeless tobacco: Never Used  Substance Use Topics  . Alcohol use: No    Review of Systems  DATA OBTAINED: from nurse GENERAL:  no fevers, fatigue, appetite changes SKIN: No itching, rash HEENT: No complaint RESPIRATORY: No cough, wheezing, SOB CARDIAC: No chest pain, palpitations, lower extremity edema  GI: No abdominal pain, No N/V/D or constipation, No heartburn or reflux  GU: No dysuria, frequency or urgency, or incontinence  MUSCULOSKELETAL: No unrelieved bone/joint pain NEUROLOGIC: No headache, dizziness  PSYCHIATRIC: No overt anxiety or sadness  Vitals:   12/01/18 1214  BP: 110/70  Pulse: 74  Resp: 18  Temp: 98.4 F (36.9 C)  SpO2: 95%   Body mass index is 20.67 kg/m. Physical Exam  GENERAL APPEARANCE: Alert, conversant, No acute distress  SKIN: No diaphoresis rash HEENT: Unremarkable  RESPIRATORY: Breathing is even, unlabored. Lung sounds are clear   CARDIOVASCULAR: Heart RRR no murmurs, rubs or gallops. No peripheral edema  GASTROINTESTINAL: Abdomen is soft, non-tender, not distended w/ normal bowel sounds.  GENITOURINARY: Bladder non tender, not distended  MUSCULOSKELETAL: No abnormal joints or musculature NEUROLOGIC: Cranial nerves 2-12 grossly intact. Moves all extremities PSYCHIATRIC: Mood and affect appropriate to situation, no behavioral issues  Patient Active Problem List   Diagnosis Date Noted  . MRSA bacteremia 09/21/2018  . Hospice care patient 09/17/2018  . Dehydration 08/30/2018  . Real time reverse transcriptase PCR positive for COVID-19 virus 08/30/2018  . Bilateral pneumonia 07/26/2018  . Rhonchi 07/26/2018  . Xerosis cutis 07/26/2018  . Restless leg syndrome 05/19/2018  . Chronic generalized pain 05/19/2018  . Hypotension 08/31/2016  . Postural  dizziness with presyncope 08/31/2016  . Chest pain 08/31/2016  . Paroxysmal atrial fibrillation ([place]) 08/31/2016  . Hypokalemia 08/31/2016  . Depression 08/31/2016  . Vitamin D deficiency 08/31/2016  . Mood disorder ([place]) 08/22/2016  . Necrotizing pneumonia ([place]) 05/10/2016  . COPD exacerbation ([place]) 05/10/2016  . UTI (urinary tract infection) 05/10/2016  . Paroxysmal atrial fibrillation with RVR ([place]) 05/10/2016  . Metabolic encephalopathy 02/40/9735  . Elevated amylase and lipase 02/13/2016  . Acute encephalopathy 02/13/2016  . Psychosis in elderly ([place]) 02/13/2016  . Dementia with behavioral disturbance ([place]) 02/13/2016  . Overactive bladder 02/13/2016  . Chronic kidney disease   . COPD (chronic obstructive pulmonary disease) ([place])   . Anemia   . Anxiety   . Barrett's esophagus   . Hypothyroidism   . GERD (gastroesophageal reflux disease)   . Hypertension   . Memory difficulties   . Osteoporosis   . Psoriasis (a type of skin inflammation)     CMP     Component Value Date/Time   NA 138 10/22/2018   NA 143 03/18/2017   K 4.8 10/22/2018   K 3.8 03/18/2017   CL 100 (L) 01/26/2016 1935   CO2 30 01/26/2016 1935   GLUCOSE 115 (H) 01/26/2016 1935   BUN 14 10/22/2018   CREATININE 0.8 10/22/2018   CREATININE 0.64 03/18/2017   CALCIUM 9.2 03/18/2017   ALBUMIN 3.8 03/18/2017   AST 36 (A) 10/12/2018   AST 9 03/18/2017   ALT 29 10/12/2018   ALT 7 03/18/2017   ALKPHOS 104 10/12/2018   ALKPHOS 99 03/18/2017   BILITOT 0.2 03/18/2017   GFRNONAA 85.41 03/18/2017   GFRAA >60 01/26/2016 1935   Recent Labs    10/05/18 10/12/18 10/22/18  NA 141 140 138  K 3.8 4.0 4.8  BUN 8 10 14   CREATININE 0.9 0.8 0.8   Recent Labs    09/29/18 10/05/18 10/12/18  AST 13 14 36*  ALT 11 11 29   ALKPHOS 92 100 104   Recent Labs    10/05/18 10/12/18 10/22/18  WBC 6.7 5.6 5.2  NEUTROABS 4 3 3   HGB 10.5* 10.5* 10.4*  HCT 31* 32* 31*  PLT 257 333 235   Recent Labs    12/22/17  CHOL  185  LDLCALC 110  TRIG 203*   No results found for: [facility] Lab Results  Component Value Date   TSH 0.52 10/22/2018   Lab Results  Component Value Date   HGBA1C 5.4 12/22/2017   Lab Results  Component Value Date   CHOL 185 12/22/2017   HDL 34 (A) 12/22/2017   LDLCALC 110 12/22/2017   TRIG 203 (A) 12/22/2017    Significant Diagnostic Results in last  30 days:  No results found.  Assessment and Plan  Hypertension Controlled, no medications; continue to monitor  COPD (chronic obstructive pulmonary disease) ([facility]) No recent exacerbations; continue guaifenesin 200 mg twice daily, theophylline 100 mg twice daily, and as needed albuterol neb and as needed DuoNeb  Barrett's esophagus GERD has been controlled; continue Protonix 40 mg twice daily and Zantac 75 mg nightly    [name], MD

## 2018-12-03 ENCOUNTER — Other Ambulatory Visit: Payer: Self-pay

## 2018-12-03 MED ORDER — HYDROCODONE-ACETAMINOPHEN 5-325 MG PO TABS: 1.0000 | ORAL_TABLET | Freq: Three times a day (TID) | ORAL | 0 refills | Status: DC

## 2018-12-06 ENCOUNTER — Encounter: Payer: Self-pay | Admitting: Internal Medicine

## 2018-12-06 NOTE — Assessment & Plan Note (Signed)
No recent exacerbations; continue guaifenesin 200 mg twice daily, theophylline 100 mg twice daily, and as needed albuterol neb and as needed DuoNeb

## 2018-12-06 NOTE — Assessment & Plan Note (Signed)
Controlled, no medications; continue to monitor

## 2018-12-06 NOTE — Assessment & Plan Note (Signed)
GERD has been controlled; continue Protonix 40 mg twice daily and Zantac 75 mg nightly

## 2018-12-14 ENCOUNTER — Other Ambulatory Visit: Payer: Self-pay | Admitting: Internal Medicine

## 2018-12-15 ENCOUNTER — Other Ambulatory Visit: Payer: Self-pay | Admitting: Internal Medicine

## 2018-12-15 MED ORDER — HYDROCODONE-ACETAMINOPHEN 5-325 MG PO TABS: 1.0000 | ORAL_TABLET | Freq: Three times a day (TID) | ORAL | 0 refills | Status: DC

## 2018-12-29 ENCOUNTER — Non-Acute Institutional Stay (SKILLED_NURSING_FACILITY): Payer: Medicare Other | Admitting: Internal Medicine

## 2018-12-29 ENCOUNTER — Encounter: Payer: Self-pay | Admitting: Internal Medicine

## 2018-12-29 DIAGNOSIS — K2100 Gastro-esophageal reflux disease with esophagitis, without bleeding: Secondary | ICD-10-CM

## 2018-12-29 DIAGNOSIS — E034 Atrophy of thyroid (acquired): Secondary | ICD-10-CM

## 2018-12-29 DIAGNOSIS — I480 Paroxysmal atrial fibrillation: Secondary | ICD-10-CM

## 2018-12-29 NOTE — Progress Notes (Signed)
Location:  [facility] Room Number: 213-W Place of Service:  SNF (31)  [name], MD  Patient Care Team: [name], MD as PCP - General (Internal Medicine)  Extended Emergency Contact Information Primary Emergency Contact: [name] [phone] [name] Home Phone: [phone] Relation: Niece Secondary Emergency Contact: Dorice Lamas [place] Home Phone: [phone] Relation: Niece    Allergies: Azithromycin, Cephalexin, Cephalosporins, Ciprofloxacin, Darvon [propoxyphene], Flagyl [metronidazole], Librax [chlordiazepoxide-clidinium], Macrodantin [nitrofurantoin macrocrystal], Nitrofuran derivatives, Other, Oxycodone-acetaminophen, Percocet [oxycodone-acetaminophen], Quetiapine, Sulfa antibiotics, and Sulfamethoxazole  Chief Complaint  Patient presents with  . Medical Management of Chronic Issues    Routine [facility] visit    HPI: Patient is an 78 y.o. female who is being seen for routine issues of paroxysmal atrial fibrillation, GERD, and hypothyroidism.  Past Medical History:  Diagnosis Date  . Acute encephalopathy 02/13/2016  . Anemia   . Anxiety   . Arthritis   . Asthma   . Barrett's esophagus   . Chronic kidney disease   . COPD (chronic obstructive pulmonary disease) (HCC)   . Dementia with behavioral disturbance (HCC) 02/13/2016  . Depression   . Disease of thyroid gland   . Diverticulitis   . GERD (gastroesophageal reflux disease)   . Hyperlipemia   . Hypertension   . Memory difficulties   . Osteoporosis   . Pancreatitis   . Paroxysmal atrial fibrillation with RVR (HCC) 05/10/2016  . Psoriasis (a type of skin inflammation)   . Vertigo   . Vitamin D deficiency 08/31/2016    Past Surgical History:  Procedure Laterality Date  . BACK SURGERY    . brain shun    . CHOLECYSTECTOMY    . ROTATOR CUFF REPAIR    . SHOULDER SURGERY    . UPPER GI ENDOSCOPY   12/19/2015   [facility], Include esophagus, stomach & duodenum or / Jejunum: Dx w/wo biopsys and dilatation; Surgeon : Leonia Reeves Rhoton MD    Allergies as of 12/29/2018      Reactions   Azithromycin Other (See Comments)   unknown   Cephalexin Other (See Comments)   unknown   Cephalosporins    Ciprofloxacin Other (See Comments)   unknown   Darvon [propoxyphene]    Flagyl [metronidazole] Other (See Comments)   unknown   Librax [chlordiazepoxide-clidinium] Other (See Comments)   unknown   Macrodantin [nitrofurantoin Macrocrystal]    Nitrofuran Derivatives Other (See Comments)   unknown   Other Other (See Comments)   unknown   Oxycodone-acetaminophen Other (See Comments)   unknown   Percocet [oxycodone-acetaminophen]    Quetiapine Other (See Comments)   unknown Potassium level dropped.   Sulfa Antibiotics    Sulfamethoxazole Other (See Comments)   unknown      Medication List       Accurate as of December 29, 2018 11:59 PM. If you have any questions, ask your nurse or doctor.        acetaminophen 325 MG tablet Commonly known as: TYLENOL Take 650 mg by mouth every 8 (eight) hours as needed.   albuterol 108 (90 Base) MCG/ACT inhaler Commonly known as: VENTOLIN HFA Inhale 2 puffs into the lungs every 6 (six) hours as needed for wheezing or shortness of breath.   aspirin 81 MG chewable tablet Chew 81 mg by mouth daily.   bisacodyl 10 MG suppository Commonly known as: DULCOLAX Place 10 mg  rectally daily as needed for moderate constipation (Constipation not relieved by MOM).   budesonide-formoterol 160-4.5 MCG/ACT inhaler Commonly known as: SYMBICORT Inhale 2 puffs into the lungs 2 (two) times daily.   cetirizine 10 MG tablet Commonly known as: ZYRTEC Take 10 mg by mouth at bedtime.   donepezil 10 MG tablet Commonly known as: ARICEPT Take 10 mg by mouth at bedtime.   ENSURE ENLIVE PO Take 237 mLs by mouth 2 (two) times daily.   eucerin cream Apply 1  application topically daily. Apply to face   famotidine 20 MG tablet Commonly known as: PEPCID Take 20 mg by mouth at bedtime.   fluticasone 50 MCG/ACT nasal spray Commonly known as: FLONASE Place 2 sprays into both nostrils daily as needed for allergies.   guaiFENesin 100 MG/5ML liquid Commonly known as: ROBITUSSIN Take 400 mg by mouth every 6 (six) hours as needed for cough.   HYDROcodone-acetaminophen 5-325 MG tablet Commonly known as: NORCO/VICODIN Take 1 tablet by mouth every 8 (eight) hours.   levothyroxine 100 MCG tablet Commonly known as: SYNTHROID Take 100 mcg by mouth daily before breakfast.   magnesium hydroxide 400 MG/5ML suspension Commonly known as: MILK OF MAGNESIA Take 30 mLs by mouth daily as needed for mild constipation (If no BM in 3 days).   mineral oil enema Place 1 enema rectally once. For constipation not relieved by Dulcolax suppository   mirtazapine 15 MG tablet Commonly known as: REMERON Take 7.5 mg by mouth at bedtime. 0.5 tablet to = 7.5   oxybutynin 5 MG tablet Commonly known as: DITROPAN Take 5 mg by mouth 3 (three) times daily.   potassium chloride SA 20 MEQ tablet Commonly known as: KLOR-CON Take 20 mEq by mouth daily.   rOPINIRole 0.5 MG tablet Commonly known as: REQUIP Take 0.5 mg by mouth 2 (two) times daily.   venlafaxine XR 75 MG 24 hr capsule Commonly known as: EFFEXOR-XR take 1 tab po daily for depression       No orders of the defined types were placed in this encounter.   Immunization History  Administered Date(s) Administered  . Influenza, High Dose Seasonal PF 12/20/2014  . Influenza, Seasonal, Injecte, Preservative Fre 10/10/2014  . Influenza-Unspecified 11/21/2016, 11/19/2017, 11/14/2018  . PPD Test 02/10/2016, 02/26/2016  . Pneumococcal Conjugate-13 12/20/2014  . Pneumococcal Polysaccharide-23 02/07/2017  . Td 08/26/2014    Social History   Tobacco Use  . Smoking status: Former Smoker    Quit date:  12/17/1990    Years since quitting: 28.0  . Smokeless tobacco: Never Used  Substance Use Topics  . Alcohol use: No    Review of Systems  DATA OBTAINED: from patient, nurse GENERAL:  no fevers, fatigue, appetite changes SKIN: No itching, rash HEENT: No complaint RESPIRATORY: No cough, wheezing, SOB CARDIAC: No chest pain, palpitations, lower extremity edema  GI: No abdominal pain, No N/V/D or constipation, No heartburn or reflux  GU: No dysuria, frequency or urgency, or incontinence  MUSCULOSKELETAL: No unrelieved bone/joint pain NEUROLOGIC: No headache, dizziness  PSYCHIATRIC: No overt anxiety or sadness  Vitals:   12/29/18 0920  BP: 126/70  Pulse: 70  Resp: 18  Temp: 97.8 F (36.6 C)  SpO2: 98%   Body mass index is 20.69 kg/m. Physical Exam  GENERAL APPEARANCE: Alert, conversant, No acute distress, looking better and better since Covid infection SKIN: No diaphoresis rash HEENT: Unremarkable RESPIRATORY: Breathing is even, unlabored. Lung sounds are clear   CARDIOVASCULAR: Heart RRR no murmurs, rubs or gallops.  No peripheral edema  GASTROINTESTINAL: Abdomen is soft, non-tender, not distended w/ normal bowel sounds.  GENITOURINARY: Bladder non tender, not distended  MUSCULOSKELETAL: No abnormal joints or musculature NEUROLOGIC: Cranial nerves 2-12 grossly intact. Moves all extremities PSYCHIATRIC: Mood and affect appropriate to situation, no behavioral issues  Patient Active Problem List   Diagnosis Date Noted  . MRSA bacteremia 09/21/2018  . Hospice care patient 09/17/2018  . Dehydration 08/30/2018  . Real time reverse transcriptase PCR positive for COVID-19 virus 08/30/2018  . Bilateral pneumonia 07/26/2018  . Rhonchi 07/26/2018  . Xerosis cutis 07/26/2018  . Restless leg syndrome 05/19/2018  . Chronic generalized pain 05/19/2018  . Hypotension 08/31/2016  . Postural dizziness with presyncope 08/31/2016  . Chest pain 08/31/2016  . Paroxysmal atrial  fibrillation (HCC) 08/31/2016  . Hypokalemia 08/31/2016  . Depression 08/31/2016  . Vitamin D deficiency 08/31/2016  . Mood disorder (HCC) 08/22/2016  . Necrotizing pneumonia (HCC) 05/10/2016  . COPD exacerbation (HCC) 05/10/2016  . UTI (urinary tract infection) 05/10/2016  . Paroxysmal atrial fibrillation with RVR (HCC) 05/10/2016  . Metabolic encephalopathy 05/10/2016  . Elevated amylase and lipase 02/13/2016  . Acute encephalopathy 02/13/2016  . 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 Dementia with behavioral disturbance (HCC) 02/13/2016  . Overactive bladder 02/13/2016  . Chronic kidney disease   . COPD (chronic obstructive pulmonary disease) (HCC)   . Anemia   . Anxiety   . Barrett's esophagus   . Hypothyroidism   . GERD (gastroesophageal reflux disease)   . Hypertension   . Memory difficulties   . Osteoporosis   . Psoriasis (a type of skin inflammation)     CMP     Component Value Date/Time   NA 138 10/22/2018   NA 143 03/18/2017   K 4.8 10/22/2018   K 3.8 03/18/2017   CL 100 (L) 01/26/2016 1935   CO2 30 01/26/2016 1935   GLUCOSE 115 (H) 01/26/2016 1935   BUN 14 10/22/2018   CREATININE 0.8 10/22/2018   CREATININE 0.64 03/18/2017   CALCIUM 9.2 03/18/2017   ALBUMIN 3.8 03/18/2017   AST 36 (A) 10/12/2018   AST 9 03/18/2017   ALT 29 10/12/2018   ALT 7 03/18/2017   ALKPHOS 104 10/12/2018   ALKPHOS 99 03/18/2017   BILITOT 0.2 03/18/2017   GFRNONAA 85.41 03/18/2017   GFRAA >60 01/26/2016 1935   Recent Labs    10/05/18 10/12/18 10/22/18  NA 141 140 138  K 3.8 4.0 4.8  BUN CREATININE 0.9 0.8 0.8   Recent Labs    09/29/18 10/05/18 10/12/18  AST 13 14 36*  ALT ALKPHOS 92 100 104   Recent Labs    10/05/18 10/12/18 10/22/18  WBC 6.7 5.6 5.2  NEUTROABS HGB 10.5* 10.5* 10.4*  HCT 31* 32* 31*  PLT 257 333 235   No results for input(s): CHOL, LDLCALC, TRIG in the last 8760 hours.  Invalid input(s): HCL No results found for:  [facility] Lab Results  Component Value Date   TSH 0.52 10/22/2018   Lab Results  Component Value Date   HGBA1C 5.4 12/22/2017   Lab Results  Component Value Date   CHOL 185 12/22/2017   HDL 34 (A) 12/22/2017   LDLCALC 110 12/22/2017   TRIG 203 (A) 12/22/2017    Significant Diagnostic Results in last 30 days:  No results found.  Assessment and Plan  Paroxysmal atrial fibrillation [facility]) Patient on no meds for  prophylaxis or rhythm per cardiology; continue to monitor  GERD (gastroesophageal reflux disease) No complaints of reflux; patient is now on Pepcid 20 mg daily with good response  Hypothyroidism TSH 0.52, which is low normal; will follow; continue Synthroid 100 mcg daily     Margit HanksAnne D Lillybeth Tal, MD

## 2019-01-02 ENCOUNTER — Encounter: Payer: Self-pay | Admitting: Internal Medicine

## 2019-01-02 NOTE — Assessment & Plan Note (Signed)
TSH 0.52, which is low normal; will follow; continue Synthroid 100 mcg daily

## 2019-01-02 NOTE — Assessment & Plan Note (Signed)
Patient on no meds for prophylaxis or rhythm per cardiology; continue to monitor

## 2019-01-02 NOTE — Assessment & Plan Note (Signed)
No complaints of reflux; patient is now on Pepcid 20 mg daily with good response

## 2019-01-04 ENCOUNTER — Non-Acute Institutional Stay (SKILLED_NURSING_FACILITY): Payer: Medicare Other | Admitting: Internal Medicine

## 2019-01-04 DIAGNOSIS — Z298 Encounter for other specified prophylactic measures: Secondary | ICD-10-CM

## 2019-01-04 DIAGNOSIS — I10 Essential (primary) hypertension: Secondary | ICD-10-CM

## 2019-01-04 DIAGNOSIS — J449 Chronic obstructive pulmonary disease, unspecified: Secondary | ICD-10-CM

## 2019-01-04 DIAGNOSIS — Z789 Other specified health status: Secondary | ICD-10-CM

## 2019-01-05 LAB — VITAMIN D 25 HYDROXY (VIT D DEFICIENCY, FRACTURES): Vit D, 25-Hydroxy: 32.84

## 2019-01-05 NOTE — Progress Notes (Signed)
Location[facility] Room Number: [id number]-W Place of Service:  SNF (31)  Margit HanksAlexander, Thurmond Hildebran D, MD  Patient Care Team: Margit HanksAlexander, Jobani Sabado D, MD as PCP - General (Internal Medicine)  Extended Emergency Contact Information Primary Emergency Contact: Docia BarrierHopkins,Tammy          [name] [phone] [place] Home Phone: [phone] Relation: Niece Secondary Emergency Contact: [name],Donna  [place] Home Phone: [phone] Relation: Niece    Allergies: Azithromycin, Cephalexin, Cephalosporins, Ciprofloxacin, Darvon [propoxyphene], Flagyl [metronidazole], Librax [chlordiazepoxide-clidinium], Macrodantin [nitrofurantoin macrocrystal], Nitrofuran derivatives, Other, Oxycodone-acetaminophen, Percocet [oxycodone-acetaminophen], Quetiapine, Sulfa antibiotics, and Sulfamethoxazole  Chief Complaint  Patient presents with  . Acute Visit    Patient is seen for COVID prophylaxis    HPI: Patient is an 78 y.o. female who is being seen today for viral prophylaxis.  Everyone in the facility has been put on vitamin C, zinc, and vitamin D.  Past Medical History:  Diagnosis Date  . Acute encephalopathy 02/13/2016  . Anemia   . Anxiety   . Arthritis   . Asthma   . Barrett's esophagus   . Chronic kidney disease   . COPD (chronic obstructive pulmonary disease) (HCC)   . Dementia with behavioral disturbance (HCC) 02/13/2016  . Depression   . Disease of thyroid gland   . Diverticulitis   . GERD (gastroesophageal reflux disease)   . Hyperlipemia   . Hypertension   . Memory difficulties   . Osteoporosis   . Pancreatitis   . Paroxysmal atrial fibrillation with RVR (HCC) 05/10/2016  . Psoriasis (a type of skin inflammation)   . Vertigo   . Vitamin D deficiency 08/31/2016    Past Surgical History:  Procedure Laterality Date  . BACK SURGERY    . brain shun    . CHOLECYSTECTOMY    . ROTATOR CUFF REPAIR    . SHOULDER SURGERY    . UPPER GI ENDOSCOPY   12/19/2015   [facility], Include esophagus, stomach & duodenum or / Jejunum: Dx w/wo biopsys and dilatation; Surgeon : Leonia ReevesAlbert John Rhoton MD    Allergies as of 01/04/2019      Reactions   Azithromycin Other (See Comments)   unknown   Cephalexin Other (See Comments)   unknown   Cephalosporins    Ciprofloxacin Other (See Comments)   unknown   Darvon [propoxyphene]    Flagyl [metronidazole] Other (See Comments)   unknown   Librax [chlordiazepoxide-clidinium] Other (See Comments)   unknown   Macrodantin [nitrofurantoin Macrocrystal]    Nitrofuran Derivatives Other (See Comments)   unknown   Other Other (See Comments)   unknown   Oxycodone-acetaminophen Other (See Comments)   unknown   Percocet [oxycodone-acetaminophen]    Quetiapine Other (See Comments)   unknown Potassium level dropped.   Sulfa Antibiotics    Sulfamethoxazole Other (See Comments)   unknown      Medication List       Accurate as of January 04, 2019 11:59 PM. If you have any questions, ask your nurse or doctor.        acetaminophen 325 MG tablet Commonly known as: TYLENOL Take 650 mg by mouth every 8 (eight) hours as needed.   albuterol 108 (90 Base) MCG/ACT inhaler Commonly known as: VENTOLIN HFA Inhale 2 puffs into the lungs every 6 (six) hours as needed for wheezing or shortness of breath.   aspirin 81 MG chewable tablet Chew 81 mg by mouth daily.   bisacodyl 10 MG suppository  Commonly known as: DULCOLAX Place 10 mg rectally daily as needed for moderate constipation (Constipation not relieved by MOM).   budesonide-formoterol 160-4.5 MCG/ACT inhaler Commonly known as: SYMBICORT Inhale 2 puffs into the lungs 2 (two) times daily.   cetirizine 10 MG tablet Commonly known as: ZYRTEC Take 10 mg by mouth at bedtime.   donepezil 10 MG tablet Commonly known as: ARICEPT Take 10 mg by mouth at bedtime.   ENSURE ENLIVE PO Take 237 mLs by mouth 2 (two) times daily.   eucerin cream Apply 1  application topically daily. Apply to face   famotidine 20 MG tablet Commonly known as: PEPCID Take 20 mg by mouth at bedtime.   fluticasone 50 MCG/ACT nasal spray Commonly known as: FLONASE Place 2 sprays into both nostrils daily as needed for allergies.   guaiFENesin 100 MG/5ML liquid Commonly known as: ROBITUSSIN Take 400 mg by mouth every 6 (six) hours as needed for cough.   HYDROcodone-acetaminophen 5-325 MG tablet Commonly known as: NORCO/VICODIN Take 1 tablet by mouth every 8 (eight) hours.   levothyroxine 100 MCG tablet Commonly known as: SYNTHROID Take 100 mcg by mouth daily before breakfast.   magnesium hydroxide 400 MG/5ML suspension Commonly known as: MILK OF MAGNESIA Take 30 mLs by mouth daily as needed for mild constipation (If no BM in 3 days).   mineral oil enema Place 1 enema rectally once. For constipation not relieved by Dulcolax suppository   mirtazapine 15 MG tablet Commonly known as: REMERON Take 7.5 mg by mouth at bedtime. 0.5 tablet to = 7.5   oxybutynin 5 MG tablet Commonly known as: DITROPAN Take 5 mg by mouth 3 (three) times daily.   potassium chloride SA 20 MEQ tablet Commonly known as: KLOR-CON Take 20 mEq by mouth daily.   rOPINIRole 0.5 MG tablet Commonly known as: REQUIP Take 0.5 mg by mouth 2 (two) times daily.   venlafaxine XR 75 MG 24 hr capsule Commonly known as: EFFEXOR-XR take 1 tab po daily for depression       No orders of the defined types were placed in this encounter.   Immunization History  Administered Date(s) Administered  . Influenza, High Dose Seasonal PF 12/20/2014  . Influenza, Seasonal, Injecte, Preservative Fre 10/10/2014  . Influenza-Unspecified 11/21/2016, 11/19/2017, 11/14/2018  . PPD Test 02/10/2016, 02/26/2016  . Pneumococcal Conjugate-13 12/20/2014  . Pneumococcal Polysaccharide-23 02/07/2017  . Td 08/26/2014    Social History   Tobacco Use  . Smoking status: Former Smoker    Quit date:  12/17/1990    Years since quitting: 28.0  . Smokeless tobacco: Never Used  Substance Use Topics  . Alcohol use: No    Review of Systems  GENERAL:  no fevers, fatigue, appetite changes SKIN: No itching, rash HEENT: No complaint RESPIRATORY: No cough, wheezing, SOB CARDIAC: No chest pain, palpitations, lower extremity edema  GI: No abdominal pain, No N/V/D or constipation, No heartburn or reflux  GU: No dysuria, frequency or urgency, or incontinence  MUSCULOSKELETAL: No unrelieved bone/joint pain NEUROLOGIC: No headache, dizziness  PSYCHIATRIC: No overt anxiety or sadness  Vitals:   01/04/19 1626  BP: 96/63  Pulse: 79  Resp: 18  Temp: 98 F (36.7 C)  SpO2: 98%   Body mass index is 20.69 kg/m. Physical Exam  GENERAL APPEARANCE: Alert, conversant, No acute distress  SKIN: No diaphoresis rash HEENT: Unremarkable RESPIRATORY: Breathing is even, unlabored. Lung sounds are clear   CARDIOVASCULAR: Heart RRR no murmurs, rubs or gallops. No peripheral edema  GASTROINTESTINAL: Abdomen is soft, non-tender, not distended w/ normal bowel sounds.  GENITOURINARY: Bladder non tender, not distended  MUSCULOSKELETAL: No abnormal joints or musculature NEUROLOGIC: Cranial nerves 2-12 grossly intact. Moves all extremities PSYCHIATRIC: Mood and affect appropriate to situation with some dementia, no behavioral issues  Patient Active Problem List   Diagnosis Date Noted  . MRSA bacteremia 09/21/2018  . Hospice care patient 09/17/2018  . Dehydration 08/30/2018  . Real time reverse transcriptase PCR positive for COVID-19 virus 08/30/2018  . Bilateral pneumonia 07/26/2018  . Rhonchi 07/26/2018  . Xerosis cutis 07/26/2018  . Restless leg syndrome 05/19/2018  . Chronic generalized pain 05/19/2018  . Hypotension 08/31/2016  . Postural dizziness with presyncope 08/31/2016  . Chest pain 08/31/2016  . Paroxysmal atrial fibrillation ([place]) 08/31/2016  . Hypokalemia 08/31/2016  . Depression  08/31/2016  . Vitamin D deficiency 08/31/2016  . Mood disorder ([place]) 08/22/2016  . Necrotizing pneumonia ([place]) 05/10/2016  . COPD exacerbation ([place]) 05/10/2016  . UTI (urinary tract infection) 05/10/2016  . Paroxysmal atrial fibrillation with RVR ([place]) 05/10/2016  . Metabolic encephalopathy 67/89/3810  . Elevated amylase and lipase 02/13/2016  . Acute encephalopathy 02/13/2016  . Psychosis in elderly ([place]) 02/13/2016  . Dementia with behavioral disturbance ([place]) 02/13/2016  . Overactive bladder 02/13/2016  . Chronic kidney disease   . COPD (chronic obstructive pulmonary disease) ([place])   . Anemia   . Anxiety   . Barrett's esophagus   . Hypothyroidism   . GERD (gastroesophageal reflux disease)   . Hypertension   . Memory difficulties   . Osteoporosis   . Psoriasis (a type of skin inflammation)     CMP     Component Value Date/Time   NA 138 10/22/2018   NA 143 03/18/2017   K 4.8 10/22/2018   K 3.8 03/18/2017   CL 100 (L) 01/26/2016 1935   CO2 30 01/26/2016 1935   GLUCOSE 115 (H) 01/26/2016 1935   BUN 14 10/22/2018   CREATININE 0.8 10/22/2018   CREATININE 0.64 03/18/2017   CALCIUM 9.2 03/18/2017   ALBUMIN 3.8 03/18/2017   AST 36 (A) 10/12/2018   AST 9 03/18/2017   ALT 29 10/12/2018   ALT 7 03/18/2017   ALKPHOS 104 10/12/2018   ALKPHOS 99 03/18/2017   BILITOT 0.2 03/18/2017   GFRNONAA 85.41 03/18/2017   GFRAA >60 01/26/2016 1935   Recent Labs    10/05/18 10/12/18 10/22/18  NA 141 140 138  K 3.8 4.0 4.8  BUN 8 10 14   CREATININE 0.9 0.8 0.8   Recent Labs    09/29/18 10/05/18 10/12/18  AST 13 14 36*  ALT 11 11 29   ALKPHOS 92 100 104   Recent Labs    10/05/18 10/12/18 10/22/18  WBC 6.7 5.6 5.2  NEUTROABS 4 3 3   HGB 10.5* 10.5* 10.4*  HCT 31* 32* 31*  PLT 257 333 235   No results for input(s): CHOL, LDLCALC, TRIG in the last 8760 hours.  Invalid input(s): HCL No results found for: [facility] Lab Results  Component Value Date   TSH 0.52 10/22/2018    Lab Results  Component Value Date   HGBA1C 5.4 12/22/2017   Lab Results  Component Value Date   CHOL 185 12/22/2017   HDL 34 (A) 12/22/2017   LDLCALC 110 12/22/2017   TRIG 203 (A) 12/22/2017    Significant Diagnostic Results in last 30 days:  No results found.  Assessment and Plan  Hypertension/COPD/need for prophylactic immunotherapy/does not have immunity to COVID-19-pharmacy was  consulted.  Patient is on 81 mg aspirin daily.  Have written for patient zinc sulfate 220 mg one p.o. every other day, vitamin C 500 mg daily and vitamin D 50,000 units monthly.  Vitamin D level will be drawn today and again in 90 days.    [name], MD

## 2019-01-10 ENCOUNTER — Encounter: Payer: Self-pay | Admitting: Internal Medicine

## 2019-01-25 ENCOUNTER — Non-Acute Institutional Stay (SKILLED_NURSING_FACILITY): Payer: Medicare Other | Admitting: Internal Medicine

## 2019-01-25 ENCOUNTER — Encounter: Payer: Self-pay | Admitting: Internal Medicine

## 2019-01-25 DIAGNOSIS — F0281 Dementia in other diseases classified elsewhere with behavioral disturbance: Secondary | ICD-10-CM

## 2019-01-25 DIAGNOSIS — J449 Chronic obstructive pulmonary disease, unspecified: Secondary | ICD-10-CM | POA: Diagnosis not present

## 2019-01-25 DIAGNOSIS — E034 Atrophy of thyroid (acquired): Secondary | ICD-10-CM | POA: Diagnosis not present

## 2019-01-25 DIAGNOSIS — E876 Hypokalemia: Secondary | ICD-10-CM

## 2019-01-25 DIAGNOSIS — R531 Weakness: Secondary | ICD-10-CM

## 2019-01-25 DIAGNOSIS — G301 Alzheimer's disease with late onset: Secondary | ICD-10-CM

## 2019-01-25 DIAGNOSIS — I480 Paroxysmal atrial fibrillation: Secondary | ICD-10-CM

## 2019-01-25 DIAGNOSIS — F02818 Dementia in other diseases classified elsewhere, unspecified severity, with other behavioral disturbance: Secondary | ICD-10-CM

## 2019-01-25 NOTE — Progress Notes (Signed)
Location:  [facility] Room Number: 213-W Place of Service:  SNF [phone]) Provider:  Edmon Crape, PA-C  Patient Care Team: [name], MD as PCP - General (Internal Medicine)  Extended Emergency Contact Information Primary Emergency Contact: Hopkins,Tammy Address: [address] [address] [name] Home Phone: [phone] Relation: Niece Secondary Emergency Contact: Mirian Capuchin Address: [address]          [place], [place] [address] [name] Home Phone: [phone] Relation: Niece  Code Status:   Goals of care: Advanced Directive information Advanced Directives 01/05/2019  Does Patient Have a Medical Advance Directive? Yes  Type of Advance Directive Out of facility DNR (pink MOST or yellow form)  Does patient want to make changes to medical advance directive? No - Patient declined  Copy of Healthcare Power of Attorney in Chart? -  Pre-existing out of facility DNR order (yellow form or pink MOST form) -     Chief Complaint  Patient presents with  . Acute Visit    Patient is seen for complaints of weakness.  Also routine visit for medical management of chronic medical issues including management of chronic medical conditions including dementia-atrial fibrillation hypothyroidism COPD osteoarthritis osteoporosis and depression.    HPI:  Pt is a 78 y.o. female seen today for medical management of chronic diseases.  As noted above as well as complaints of weakness.  Nursing does not report any recent concerns except that this morning she appeared to be weaker than normal and was lying in bed she is usually out of bed by this time-when I talk to her she did not complain of pain but just said that she felt she was having a bad day felt weak and kind of sick.  Vital signs are stable.  She does not really complain of any chest pain nausea or vomiting says she does not have much of an  appetite-does not complain of any acute shortness of breath says at times she feels like she has trouble catching her breath.  Earlier this year she was treated for COVID-19 and did have pneumonia as well she eventually was hospitalized and responded well to IV antibiotics as well as dexamethasone and remdesivir.  Currently she is sitting on the side of the bed and just says she feels kind of sick but does not specifically complain of any specific symptoms other than feeling that she has a fever.  Regards to her other issues she does have a history of COPD and does continue on with sinus and as needed she also has orders for Symbicort twice daily and nebulizers as needed.  She also has a history of atrial fibrillation not on any meds she apparently had hypotension in the hospital with calcium channel blockers she is on aspirin for anticoagulation.  She also has a history of hypothyroidism she is on Synthroid TSH was 0.52 on most recent lab we will update this.  Regards to dementia as she does well with supportive care there was some thought she had some increased confusion after hospitalization for COVID-19 apparently some of this is persisting on top of her baseline confusion but this appears to have improved somewhat.  She also has a history of osteoarthritis with weakness and discomfort she does have orders for Norco and Tylenol which appear to be helping.     Past Medical History:  Diagnosis Date  . Acute encephalopathy  02/13/2016  . Anemia   . Anxiety   . Arthritis   . Asthma   . Barrett's esophagus   . Chronic kidney disease   . COPD (chronic obstructive pulmonary disease) (HCC)   . Dementia with behavioral disturbance (HCC) 02/13/2016  . Depression   . Disease of thyroid gland   . Diverticulitis   . GERD (gastroesophageal reflux disease)   . Hyperlipemia   . Hypertension   . Memory difficulties   . Osteoporosis   . Pancreatitis   . Paroxysmal atrial fibrillation with RVR  (HCC) 05/10/2016  . Psoriasis (a type of skin inflammation)   . Vertigo   . Vitamin D deficiency 08/31/2016   Past Surgical History:  Procedure Laterality Date  . BACK SURGERY    . brain shun    . CHOLECYSTECTOMY    . ROTATOR CUFF REPAIR    . SHOULDER SURGERY    . UPPER GI ENDOSCOPY  12/19/2015   [facility], Include esophagus, stomach & duodenum or / Jejunum: Dx w/wo biopsys and dilatation; Surgeon : Leonia ReevesAlbert John Rhoton MD    Allergies  Allergen Reactions  . Azithromycin Other (See Comments)    unknown  . Cephalexin Other (See Comments)    unknown  . Cephalosporins   . Ciprofloxacin Other (See Comments)    unknown  . Darvon [Propoxyphene]   . Flagyl [Metronidazole] Other (See Comments)    unknown  . Librax [Chlordiazepoxide-Clidinium] Other (See Comments)    unknown  . Macrodantin [Nitrofurantoin Macrocrystal]   . Nitrofuran Derivatives Other (See Comments)    unknown  . Other Other (See Comments)    unknown  . Oxycodone-Acetaminophen Other (See Comments)    unknown  . Percocet [Oxycodone-Acetaminophen]   . Quetiapine Other (See Comments)    unknown Potassium level dropped.  . Sulfa Antibiotics   . Sulfamethoxazole Other (See Comments)    unknown    Outpatient Encounter Medications as of 01/25/2019  Medication Sig  . acetaminophen (TYLENOL) 325 MG tablet Take 650 mg by mouth every 8 (eight) hours as needed.   Marland Kitchen. albuterol (VENTOLIN HFA) 108 (90 Base) MCG/ACT inhaler Inhale 2 puffs into the lungs every 6 (six) hours as needed for wheezing or shortness of breath.  Marland Kitchen. aspirin 81 MG chewable tablet Chew 81 mg by mouth daily.  . bisacodyl (DULCOLAX) 10 MG suppository Place 10 mg rectally daily as needed for moderate constipation (Constipation not relieved by MOM).  . budesonide-formoterol (SYMBICORT) 160-4.5 MCG/ACT inhaler Inhale 2 puffs into the lungs 2 (two) times daily.  . cetirizine (ZYRTEC) 10 MG tablet Take 10 mg by mouth at bedtime.  . donepezil (ARICEPT) 10 MG  tablet Take 10 mg by mouth at bedtime.  . famotidine (PEPCID) 20 MG tablet Take 20 mg by mouth at bedtime.   . fluticasone (FLONASE) 50 MCG/ACT nasal spray Place 2 sprays into both nostrils daily as needed for allergies.   Marland Kitchen. guaiFENesin (ROBITUSSIN) 100 MG/5ML liquid Take 400 mg by mouth every 6 (six) hours as needed for cough.   Marland Kitchen. HYDROcodone-acetaminophen (NORCO/VICODIN) 5-325 MG tablet Take 1 tablet by mouth every 8 (eight) hours.  Marland Kitchen. levothyroxine (SYNTHROID, LEVOTHROID) 100 MCG tablet Take 100 mcg by mouth daily before breakfast.  . magnesium hydroxide (MILK OF MAGNESIA) 400 MG/5ML suspension Take 30 mLs by mouth daily as needed for mild constipation (If no BM in 3 days).  . mineral oil enema Place 1 enema rectally once. For constipation not relieved by Dulcolax suppository  . mirtazapine (REMERON)  15 MG tablet Take 7.5 mg by mouth at bedtime. 0.5 tablet to = 7.5  . Nutritional Supplements (ENSURE ENLIVE PO) Take 237 mLs by mouth 2 (two) times daily.  Marland Kitchen oxybutynin (DITROPAN) 5 MG tablet Take 5 mg by mouth 3 (three) times daily.  . potassium chloride SA (K-DUR) 20 MEQ tablet Take 20 mEq by mouth daily.  Marland Kitchen rOPINIRole (REQUIP) 0.5 MG tablet Take 0.5 mg by mouth 2 (two) times daily.  . Skin Protectants, Misc. (EUCERIN) cream Apply 1 application topically daily. Apply to face  . venlafaxine XR (EFFEXOR-XR) 75 MG 24 hr capsule take 1 tab po daily for depression   No facility-administered encounter medications on file as of 01/25/2019.    Review of Systems   General she is not complaining of any chills feels that she has a fever-temperature checks have shown her to be afebrile.  Skin does not complain of rashes itching or diaphoresis.  Head ears eyes nose mouth and throat is not complaining of visual changes or sore throat.  Respiratory does have a history of COPD with occasional cough she says this is not any worse than usual says at times she has some shortness of breath.  Cardiac does  not complain of chest pain or edema.  GI does not complain of abdominal pain of nausea or vomiting diarrhea or constipation.  GU-does not specifically describe burning with urination.  Musculoskeletal has weakness does not complain of joint pain currently.  Neurologic is positive for weakness does not complain of dizziness or headache or syncope.  And psych does have some history of dementia with depression does not complain of being overtly depressed-   Immunization History  Administered Date(s) Administered  . Influenza, High Dose Seasonal PF 12/20/2014  . Influenza, Seasonal, Injecte, Preservative Fre 10/10/2014  . Influenza-Unspecified 11/21/2016, 11/19/2017, 11/14/2018  . PPD Test 02/10/2016, 02/26/2016  . Pneumococcal Conjugate-13 12/20/2014  . Pneumococcal Polysaccharide-23 02/07/2017  . Td 08/26/2014   Pertinent  Health Maintenance Due  Topic Date Due  . INFLUENZA VACCINE  Completed  . DEXA SCAN  Completed  . PNA vac Low Risk Adult  Completed   Fall Risk  07/31/2017 07/31/2016  Falls in the past year? No Yes  Number falls in past yr: - 2 or more  Injury with Fall? - No   Functional Status Survey:    Vitals:   01/25/19 1640  BP: 103/72  Pulse: 75  Resp: 19  Temp: (!) 96.9 F (36.1 C)  TempSrc: Oral  SpO2: 98%  Weight: 116 lb 12.8 oz (53 kg)  Height:  (1.6 m)   Body mass index is 20.69 kg/m. Physical Exam  In general this is a pleasant elderly female in no distress sitting comfortably on the side of her bed.  Her skin is warm and dry she is not diaphoretic.  Eyes visual acuity appears to be intact sclera and conjunctive are clear.  Oropharynx is clear mucous membranes moist tongue is midline.  Chest is clear to auscultation there is no labored breathing.  Heart is regular rate and rhythm without murmur gallop or rub she does not have significant lower extremity edema.  Her abdomen is soft nontender with positive bowel sounds.  Musculoskeletal  is able move all extremities x4 she is able to stand and ambulate with walker but says she just feels weaker.  Neurologic appears grossly intact cannot appreciate lateralizing findings her speech is clear cranial nerves appear to be intact.  Psych she is oriented to  self she is pleasant and appropriate appears to be relatively at her baseline follows simple verbal commands without any difficulty.     Labs reviewed:  January 05, 2019.  Vitamin D level 32.4.  November 13, 2018.  Phosphorus was 3.9-parathyroid hormone was 55.96.  Vitamin D level was 39.74.   Recent Labs    10/05/18 0000 10/12/18 0000 10/22/18 0000  NA 141 140 138  K 3.8 4.0 4.8  BUN CREATININE 0.9 0.8 0.8   Recent Labs    09/29/18 0000 10/05/18 0000 10/12/18 0000  AST 13 14 36*  ALT ALKPHOS 92 100 104   Recent Labs    10/05/18 0000 10/12/18 0000 10/22/18 0000  WBC 6.7 5.6 5.2  NEUTROABS HGB 10.5* 10.5* 10.4*  HCT 31* 32* 31*  PLT 257 333 235   Lab Results  Component Value Date   TSH 0.52 10/22/2018   Lab Results  Component Value Date   HGBA1C 5.4 12/22/2017   Lab Results  Component Value Date   CHOL 185 12/22/2017   HDL 34 (A) 12/22/2017   LDLCALC 110 12/22/2017   TRIG 203 (A) 12/22/2017    Significant Diagnostic Results in last 30 days:  No results found.  Assessment/Plan  #1 weakness-with unclear etiology-we will order a chest x-ray with her history of respiratory issues-as well as a Covid test.  Also obtain a UA CNS.  Also will update lab work tomorrow a.m. and keep a close eye on her with vital signs  every shift for now.  Will obtain a CBC with differential metabolic panel as well as a TSH with her history of hypothyroidism  She is not in any distress but staff feels that she has been weaker today and she is in agreement with this-she will need to be monitored for any change  #2 history of COPD as noted above she does have orders for Kefalas  and as needed as well as nebulizers she also has Symbicort twice daily.  3.-History of GERD she continues on Pepcid 20 mg daily.  4.-History of hypothyroidism as noted above she is on Synthroid 100 mcg a day will update a TSH last TSH was 0.52.  5.  History of dementia at this point continue supportive care she is on Aricept 10 mg a day her confusion today appears to be relatively baseline initially after COVID-19 hospitalization was thought she had increased confusion from baseline I believe this has improved somewhat.  6.  History of osteoarthritis at this point appears to be controlled she does have orders for as needed Norco every 8 hours as well as as needed Tylenol.  7.  History of restless leg syndrome she is on Requip twice daily this has not been an issue recently to my knowledge.  8.  History of urinary issues she is on oxybutynin 5 mg 3 times daily again will obtain a urinalysis and culture with her history of times of dysuria and feeling weak today.  9.  History of depression she continues on Effexor 75 mg a day at this point appears to be stable she does not appear to be overtly depressed today.  10.  History of allergic rhinitis she continues on Zyrtec 10 mg nightly she does not appear to be having allergy symptoms today.  11.  History of weight loss she is on Remeron for appetite stimulation as well as supplementation- awaiting updated weight -  12.  History of  hypokalemia she is on potassium supplementation again will have this updated potassium was 4.8 on lab done in September.  [id number] note greater than 40 minutes spent assessing patient-reviewing her chart and labs discussing her status with nursing staff and coordinating and formulating a plan of care for numerous diagnoses-of note greater than 50% of time spent coordinating a plan of care with input as noted above

## 2019-01-26 ENCOUNTER — Encounter: Payer: Self-pay | Admitting: Internal Medicine

## 2019-01-26 LAB — BASIC METABOLIC PANEL
BUN: 21 (ref 4–21)
CO2: 25 — AB (ref 13–22)
Chloride: 107 (ref 99–108)
Creatinine: 0.8 (ref 0.5–1.1)
Glucose: 96
Potassium: 4.5 (ref 3.4–5.3)
Sodium: 141 (ref 137–147)

## 2019-01-26 LAB — CBC AND DIFFERENTIAL
HCT: 34 — AB (ref 36–46)
Hemoglobin: 11.3 — AB (ref 12.0–16.0)
Neutrophils Absolute: 2
Platelets: 267 (ref 150–399)
WBC: 6.8

## 2019-01-26 LAB — COMPREHENSIVE METABOLIC PANEL
Albumin: 3.7 (ref 3.5–5.0)
Calcium: 9.3 (ref 8.7–10.7)
GFR calc Af Amer: 80.72
GFR calc non Af Amer: 69.65
Globulin: 3.3

## 2019-01-26 LAB — HEPATIC FUNCTION PANEL
ALT: 9 (ref 7–35)
AST: 14 (ref 13–35)
Alkaline Phosphatase: 78 (ref 25–125)
Bilirubin, Total: 0.2

## 2019-01-26 LAB — CBC: RBC: 3.52 — AB (ref 3.87–5.11)

## 2019-01-26 LAB — TSH: TSH: 0.4 — AB (ref 0.41–5.90)

## 2019-02-02 ENCOUNTER — Encounter: Payer: Self-pay | Admitting: Internal Medicine

## 2019-02-02 ENCOUNTER — Non-Acute Institutional Stay (SKILLED_NURSING_FACILITY): Payer: Medicare Other | Admitting: Internal Medicine

## 2019-02-02 DIAGNOSIS — F02818 Dementia in other diseases classified elsewhere, unspecified severity, with other behavioral disturbance: Secondary | ICD-10-CM

## 2019-02-02 DIAGNOSIS — N3281 Overactive bladder: Secondary | ICD-10-CM | POA: Diagnosis not present

## 2019-02-02 DIAGNOSIS — F0281 Dementia in other diseases classified elsewhere with behavioral disturbance: Secondary | ICD-10-CM

## 2019-02-02 DIAGNOSIS — G301 Alzheimer's disease with late onset: Secondary | ICD-10-CM | POA: Diagnosis not present

## 2019-02-02 DIAGNOSIS — J3089 Other allergic rhinitis: Secondary | ICD-10-CM

## 2019-02-02 NOTE — Progress Notes (Signed)
[facility] Room Number: 213-W Place of Service:  SNF (31)  Margit HanksAlexander, Aedyn Mckeon D, MD  Patient Care Team: Margit HanksAlexander, Bartosz Luginbill D, MD as PCP - General (Internal Medicine)  Extended Emergency Contact Information Primary Emergency Contact: Hopkins,Tammy Address: [address]          [name] [id number] [place] Home Phone: [phone] Relation: Niece Secondary Emergency Contact: Mirian CapuchinWatts,Donna Address: [address]          [place], [place] [id number] [place] Home Phone: [phone] Relation: Niece    Allergies: Azithromycin, Cephalexin, Cephalosporins, Ciprofloxacin, Darvon [propoxyphene], Flagyl [metronidazole], Librax [chlordiazepoxide-clidinium], Macrodantin [nitrofurantoin macrocrystal], Nitrofuran derivatives, Other, Oxycodone-acetaminophen, Percocet [oxycodone-acetaminophen], Quetiapine, Sulfa antibiotics, and Sulfamethoxazole  Chief Complaint  Patient presents with  . Medical Management of Chronic Issues    Routine [facility] visit    HPI: Patient is an [age] y.o. female who is being seen for routine issues of dementia, allergic rhinitis, and OAB.  Past Medical History:  Diagnosis Date  . Acute encephalopathy 02/13/2016  . Anemia   . Anxiety   . Arthritis   . Asthma   . Barrett's esophagus   . Chronic kidney disease   . COPD (chronic obstructive pulmonary disease) (HCC)   . Dementia with behavioral disturbance (HCC) 02/13/2016  . Depression   . Disease of thyroid gland   . Diverticulitis   . GERD (gastroesophageal reflux disease)   . Hyperlipemia   . Hypertension   . Memory difficulties   . Osteoporosis   . Pancreatitis   . Paroxysmal atrial fibrillation with RVR (HCC) 05/10/2016  . Psoriasis (a type of skin inflammation)   . Vertigo   . Vitamin D deficiency 08/31/2016    Past Surgical History:  Procedure Laterality Date  . BACK SURGERY    . brain shun    . CHOLECYSTECTOMY    . ROTATOR  CUFF REPAIR    . SHOULDER SURGERY    . UPPER GI ENDOSCOPY  12/19/2015   [facility], Include esophagus, stomach & duodenum or / Jejunum: Dx w/wo biopsys and dilatation; Surgeon : Leonia ReevesAlbert John Rhoton MD    Allergies as of 02/02/2019      Reactions   Azithromycin Other (See Comments)   unknown   Cephalexin Other (See Comments)   unknown   Cephalosporins    Ciprofloxacin Other (See Comments)   unknown   Darvon [propoxyphene]    Flagyl [metronidazole] Other (See Comments)   unknown   Librax [chlordiazepoxide-clidinium] Other (See Comments)   unknown   Macrodantin [nitrofurantoin Macrocrystal]    Nitrofuran Derivatives Other (See Comments)   unknown   Other Other (See Comments)   unknown   Oxycodone-acetaminophen Other (See Comments)   unknown   Percocet [oxycodone-acetaminophen]    Quetiapine Other (See Comments)   unknown Potassium level dropped.   Sulfa Antibiotics    Sulfamethoxazole Other (See Comments)   unknown      Medication List       Accurate as of February 02, 2019 11:59 PM. If you have any questions, ask your nurse or doctor.        acetaminophen 325 MG tablet Commonly known as: TYLENOL Take 650 mg by mouth every 8 (eight) hours as needed.   albuterol 108 (90 Base) MCG/ACT inhaler Commonly known as: VENTOLIN HFA Inhale 2 puffs into the lungs every 6 (six) hours as needed for wheezing or shortness of breath.   ascorbic acid 500 MG tablet  Commonly known as: VITAMIN C Take 500 mg by mouth daily.   aspirin 81 MG chewable tablet Chew 81 mg by mouth daily.   bisacodyl 10 MG suppository Commonly known as: DULCOLAX Place 10 mg rectally daily as needed for moderate constipation (Constipation not relieved by MOM).   budesonide-formoterol 160-4.5 MCG/ACT inhaler Commonly known as: SYMBICORT Inhale 2 puffs into the lungs 2 (two) times daily.   cetirizine 10 MG tablet Commonly known as: ZYRTEC Take 10 mg by mouth at bedtime.   donepezil 10 MG  tablet Commonly known as: ARICEPT Take 10 mg by mouth at bedtime.   ENSURE ENLIVE PO Take 237 mLs by mouth 2 (two) times daily.   eucerin cream Apply 1 application topically daily. Apply to face   famotidine 20 MG tablet Commonly known as: PEPCID Take 20 mg by mouth at bedtime.   fluticasone 50 MCG/ACT nasal spray Commonly known as: FLONASE Place 2 sprays into both nostrils daily as needed for allergies.   guaiFENesin 100 MG/5ML liquid Commonly known as: ROBITUSSIN Take 400 mg by mouth every 6 (six) hours as needed for cough.   HYDROcodone-acetaminophen 5-325 MG tablet Commonly known as: NORCO/VICODIN Take 1 tablet by mouth every 8 (eight) hours.   levothyroxine 100 MCG tablet Commonly known as: SYNTHROID Take 100 mcg by mouth daily before breakfast.   magnesium hydroxide 400 MG/5ML suspension Commonly known as: MILK OF MAGNESIA Take 30 mLs by mouth daily as needed for mild constipation (If no BM in 3 days).   mineral oil enema Place 1 enema rectally once. For constipation not relieved by Dulcolax suppository   mirtazapine 15 MG tablet Commonly known as: REMERON Take 7.5 mg by mouth at bedtime. 0.5 tablet to = 7.5   oxybutynin 5 MG tablet Commonly known as: DITROPAN Take 5 mg by mouth 3 (three) times daily.   potassium chloride SA 20 MEQ tablet Commonly known as: KLOR-CON Take 20 mEq by mouth daily.   rOPINIRole 0.5 MG tablet Commonly known as: REQUIP Take 0.5 mg by mouth 2 (two) times daily.   venlafaxine XR 75 MG 24 hr capsule Commonly known as: EFFEXOR-XR take 1 tab po daily for depression   Vitamin D3 1.25 MG (50000 UT) Caps Take by mouth.   zinc sulfate 220 (50 Zn) MG capsule Take 220 mg by mouth every other day.       No orders of the defined types were placed in this encounter.   Immunization History  Administered Date(s) Administered  . Influenza, High Dose Seasonal PF 12/20/2014  . Influenza, Seasonal, Injecte, Preservative Fre  10/10/2014  . Influenza-Unspecified 11/21/2016, 11/19/2017, 11/14/2018  . PPD Test 02/10/2016, 02/26/2016  . Pneumococcal Conjugate-13 12/20/2014  . Pneumococcal Polysaccharide-23 02/07/2017  . Td 08/26/2014    Social History   Tobacco Use  . Smoking status: Former Smoker    Quit date: 12/17/1990    Years since quitting: 28.1  . Smokeless tobacco: Never Used  Substance Use Topics  . Alcohol use: No    Review of Systems  GENERAL:  no fevers, fatigue, appetite changes SKIN: No itching, rash HEENT: No complaint RESPIRATORY: No cough, wheezing, SOB CARDIAC: No chest pain, palpitations, lower extremity edema  GI: No abdominal pain, No N/V/D or constipation, No heartburn or reflux  GU: No dysuria, frequency or urgency, or incontinence  MUSCULOSKELETAL: No unrelieved bone/joint pain NEUROLOGIC: No headache, dizziness  PSYCHIATRIC: No overt anxiety or sadness  Vitals:   02/02/19 1611  BP: 102/68  Pulse: 72  Resp: 18  Temp: 98 F (36.7 C)   Body mass index is 20.57 kg/m. Physical Exam  GENERAL APPEARANCE: Alert, conversant, No acute distress  SKIN: No diaphoresis rash HEENT: Unremarkable RESPIRATORY: Breathing is even, unlabored. Lung sounds are clear   CARDIOVASCULAR: Heart RRR no murmurs, rubs or gallops. No peripheral edema  GASTROINTESTINAL: Abdomen is soft, non-tender, not distended w/ normal bowel sounds.  GENITOURINARY: Bladder non tender, not distended  MUSCULOSKELETAL: No abnormal joints or musculature NEUROLOGIC: Cranial nerves 2-12 grossly intact. Moves all extremities PSYCHIATRIC: Mood and affect appropriate to situation, a little more vague than she was prior to Covid, no behavioral issues  Patient Active Problem List   Diagnosis Date Noted  . Allergic rhinitis 02/07/2019  . MRSA bacteremia 09/21/2018  . Hospice care patient 09/17/2018  . Dehydration 08/30/2018  . Real time reverse transcriptase PCR positive for COVID-19 virus 08/30/2018  . Bilateral  pneumonia 07/26/2018  . Rhonchi 07/26/2018  . Xerosis cutis 07/26/2018  . Restless leg syndrome 05/19/2018  . Chronic generalized pain 05/19/2018  . Hypotension 08/31/2016  . Postural dizziness with presyncope 08/31/2016  . Chest pain 08/31/2016  . Paroxysmal atrial fibrillation (HCC) 08/31/2016  . Hypokalemia 08/31/2016  . Depression 08/31/2016  . Vitamin D deficiency 08/31/2016  . Mood disorder (HCC) 08/22/2016  . Necrotizing pneumonia (HCC) 05/10/2016  . COPD exacerbation (HCC) 05/10/2016  . UTI (urinary tract infection) 05/10/2016  . Paroxysmal atrial fibrillation with RVR (HCC) 05/10/2016  . Metabolic encephalopathy 05/10/2016  . Elevated amylase and lipase 02/13/2016  . Acute encephalopathy 02/13/2016  . 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 Dementia with behavioral disturbance (HCC) 02/13/2016  . Overactive bladder 02/13/2016  . Chronic kidney disease   . COPD (chronic obstructive pulmonary disease) (HCC)   . Anemia   . Anxiety   . Barrett's esophagus   . Hypothyroidism   . GERD (gastroesophageal reflux disease)   . Hypertension   . Memory difficulties   . Osteoporosis   . Psoriasis (a type of skin inflammation)     CMP     Component Value Date/Time   NA 141 01/26/2019 0000   NA 143 03/18/2017 0000   K 4.5 01/26/2019 0000   K 3.8 03/18/2017 0000   CL 107 01/26/2019 0000   CO2 25 (A) 01/26/2019 0000   GLUCOSE 115 (H) 01/26/2016 1935   BUN 21 01/26/2019 0000   CREATININE 0.8 01/26/2019 0000   CREATININE 0.64 03/18/2017 0000   CALCIUM 9.3 01/26/2019 0000   CALCIUM 9.2 03/18/2017 0000   ALBUMIN 3.7 01/26/2019 0000   ALBUMIN 3.8 03/18/2017 0000   AST 14 01/26/2019 0000   AST 9 03/18/2017 0000   ALT 9 01/26/2019 0000   ALT 7 03/18/2017 0000   ALKPHOS 78 01/26/2019 0000   ALKPHOS 99 03/18/2017 0000   BILITOT 0.2 03/18/2017 0000   GFRNONAA 69.65 01/26/2019 0000   GFRNONAA 85.41 03/18/2017 0000   GFRAA 80.72 01/26/2019 0000   Recent Labs     10/12/18 0000 10/22/18 0000 01/26/19 0000  NA 140 138 141  K 4.0 4.8 4.5  CL  --   --  107  CO2  --   --  25*  BUN CREATININE 0.8 0.8 0.8  CALCIUM  --   --  9.3   Recent Labs    10/05/18 0000 10/12/18 0000 01/26/19 0000  AST 14 36* 14  ALT ALKPHOS 100 104 78  ALBUMIN  --   --  3.7   Recent Labs    10/12/18 0000 10/22/18 0000 01/26/19 0000  WBC 5.6 5.2 6.8  NEUTROABS 3 3 2   HGB 10.5* 10.4* 11.3*  HCT 32* 31* 34*  PLT 333 235 267   No results for input(s): CHOL, LDLCALC, TRIG in the last 8760 hours.  Invalid input(s): HCL No results found for: [facility] Lab Results  Component Value Date   TSH 0.40 (A) 01/26/2019   Lab Results  Component Value Date   HGBA1C 5.4 12/22/2017   Lab Results  Component Value Date   CHOL 185 12/22/2017   HDL 34 (A) 12/22/2017   LDLCALC 110 12/22/2017   TRIG 203 (A) 12/22/2017    Significant Diagnostic Results in last 30 days:  No results found.  Assessment and Plan  Dementia with behavioral disturbance Patient with a cognitive hit after having Covid pneumonia.  Patient has been coming back, yet to see how far she can,.  Continue Aricept 10 mg daily  Allergic rhinitis Continue Flonase nasal spray 2 sprays each nostril daily  Overactive bladder No reports of problems or infections; continue Ditropan 5 mg 3 times daily    Hennie Duos, MD

## 2019-02-07 ENCOUNTER — Encounter: Payer: Self-pay | Admitting: Internal Medicine

## 2019-02-07 DIAGNOSIS — J309 Allergic rhinitis, unspecified: Secondary | ICD-10-CM | POA: Insufficient documentation

## 2019-02-07 NOTE — Assessment & Plan Note (Signed)
Patient with a cognitive hit after having Covid pneumonia.  Patient has been coming back, yet to see how far she can,.  Continue Aricept 10 mg daily

## 2019-02-07 NOTE — Assessment & Plan Note (Signed)
No reports of problems or infections; continue Ditropan 5 mg 3 times daily

## 2019-02-07 NOTE — Assessment & Plan Note (Signed)
Continue Flonase nasal spray 2 sprays each nostril daily.

## 2019-03-01 ENCOUNTER — Non-Acute Institutional Stay (SKILLED_NURSING_FACILITY): Payer: Medicare Other | Admitting: Internal Medicine

## 2019-03-01 ENCOUNTER — Encounter: Payer: Self-pay | Admitting: Internal Medicine

## 2019-03-01 DIAGNOSIS — G2581 Restless legs syndrome: Secondary | ICD-10-CM | POA: Diagnosis not present

## 2019-03-01 DIAGNOSIS — H04123 Dry eye syndrome of bilateral lacrimal glands: Secondary | ICD-10-CM | POA: Diagnosis not present

## 2019-03-01 DIAGNOSIS — F39 Unspecified mood [affective] disorder: Secondary | ICD-10-CM

## 2019-03-01 NOTE — Progress Notes (Signed)
Location:  [facility] Room Number: [id number]-W Place of Service:  SNF (31)  [name], MD  Patient Care Team: [name], MD as PCP - General (Internal Medicine)  Extended Emergency Contact Information Primary Emergency Contact: Hopkins,Tammy Address: [address] [address] [name] Home Phone: [phone] Relation: Niece Secondary Emergency Contact: Mirian Capuchin Address: [address]          [place], [place] [address] [place] Home Phone: [phone] Relation: Niece    Allergies: Azithromycin, Cephalexin, Cephalosporins, Ciprofloxacin, Darvon [propoxyphene], Flagyl [metronidazole], Librax [chlordiazepoxide-clidinium], Macrodantin [nitrofurantoin macrocrystal], Nitrofuran derivatives, Other, Oxycodone-acetaminophen, Percocet [oxycodone-acetaminophen], Quetiapine, Sulfa antibiotics, and Sulfamethoxazole  Chief Complaint  Patient presents with  . Medical Management of Chronic Issues    Routine [facility] visit    HPI: Patient is an 79 y.o. female who is being seen for routine issues of mood disorder, restless leg syndrome, and dry eye syndrome.  Past Medical History:  Diagnosis Date  . Acute encephalopathy 02/13/2016  . Anemia   . Anxiety   . Arthritis   . Asthma   . Barrett's esophagus   . Chronic kidney disease   . COPD (chronic obstructive pulmonary disease) (HCC)   . Dementia with behavioral disturbance (HCC) 02/13/2016  . Depression   . Disease of thyroid gland   . Diverticulitis   . GERD (gastroesophageal reflux disease)   . Hyperlipemia   . Hypertension   . Memory difficulties   . Osteoporosis   . Pancreatitis   . Paroxysmal atrial fibrillation with RVR (HCC) 05/10/2016  . Psoriasis (a type of skin inflammation)   . Vertigo   . Vitamin D deficiency 08/31/2016    Past Surgical History:  Procedure Laterality Date  . BACK SURGERY    . brain shun    .  CHOLECYSTECTOMY    . ROTATOR CUFF REPAIR    . SHOULDER SURGERY    . UPPER GI ENDOSCOPY  12/19/2015   [facility], Include esophagus, stomach & duodenum or / Jejunum: Dx w/wo biopsys and dilatation; Surgeon : Leonia Reeves Rhoton MD    Allergies as of 03/01/2019      Reactions   Azithromycin Other (See Comments)   unknown   Cephalexin Other (See Comments)   unknown   Cephalosporins    Ciprofloxacin Other (See Comments)   unknown   Darvon [propoxyphene]    Flagyl [metronidazole] Other (See Comments)   unknown   Librax [chlordiazepoxide-clidinium] Other (See Comments)   unknown   Macrodantin [nitrofurantoin Macrocrystal]    Nitrofuran Derivatives Other (See Comments)   unknown   Other Other (See Comments)   unknown   Oxycodone-acetaminophen Other (See Comments)   unknown   Percocet [oxycodone-acetaminophen]    Quetiapine Other (See Comments)   unknown Potassium level dropped.   Sulfa Antibiotics    Sulfamethoxazole Other (See Comments)   unknown      Medication List       Accurate as of March 01, 2019 11:59 PM. If you have any questions, ask your nurse or doctor.        acetaminophen 325 MG tablet Commonly known as: TYLENOL Take 650 mg by mouth every 8 (eight) hours as needed.   albuterol 108 (90 Base) MCG/ACT inhaler Commonly known as: VENTOLIN HFA Inhale 2 puffs into the lungs every 6 (six) hours as needed for wheezing or shortness of breath.   ascorbic  acid 500 MG tablet Commonly known as: VITAMIN C Take 500 mg by mouth daily.   aspirin 81 MG chewable tablet Chew 81 mg by mouth daily.   bisacodyl 10 MG suppository Commonly known as: DULCOLAX Place 10 mg rectally daily as needed for moderate constipation (Constipation not relieved by MOM).   budesonide-formoterol 160-4.5 MCG/ACT inhaler Commonly known as: SYMBICORT Inhale 2 puffs into the lungs 2 (two) times daily.   cetirizine 10 MG tablet Commonly known as: ZYRTEC Take 10 mg by mouth at bedtime.     donepezil 10 MG tablet Commonly known as: ARICEPT Take 10 mg by mouth at bedtime.   ENSURE ENLIVE PO Take 237 mLs by mouth 2 (two) times daily.   eucerin cream Apply 1 application topically daily. Apply to face   famotidine 20 MG tablet Commonly known as: PEPCID Take 20 mg by mouth at bedtime.   fluticasone 50 MCG/ACT nasal spray Commonly known as: FLONASE Place 2 sprays into both nostrils daily as needed for allergies.   guaiFENesin 100 MG/5ML liquid Commonly known as: ROBITUSSIN Take 400 mg by mouth every 6 (six) hours as needed for cough.   HYDROcodone-acetaminophen 5-325 MG tablet Commonly known as: NORCO/VICODIN Take 1 tablet by mouth every 8 (eight) hours.   levothyroxine 100 MCG tablet Commonly known as: SYNTHROID Take 100 mcg by mouth daily before breakfast.   magnesium hydroxide 400 MG/5ML suspension Commonly known as: MILK OF MAGNESIA Take 30 mLs by mouth daily as needed for mild constipation (If no BM in 3 days).   mineral oil enema Place 1 enema rectally once. For constipation not relieved by Dulcolax suppository   mirtazapine 15 MG tablet Commonly known as: REMERON Take 7.5 mg by mouth at bedtime. 0.5 tablet to = 7.5   oxybutynin 5 MG tablet Commonly known as: DITROPAN Take 5 mg by mouth 3 (three) times daily.   potassium chloride SA 20 MEQ tablet Commonly known as: KLOR-CON Take 20 mEq by mouth daily.   REFRESH OPTIVE ADVANCED OP Apply to eye 4 (four) times daily. Apply to both eyes   Restasis 0.05 % ophthalmic emulsion Generic drug: cycloSPORINE Place 1 drop into both eyes 2 (two) times daily.   rOPINIRole 0.5 MG tablet Commonly known as: REQUIP Take 0.5 mg by mouth 2 (two) times daily.   venlafaxine XR 75 MG 24 hr capsule Commonly known as: EFFEXOR-XR take 1 tab po daily for depression   Vitamin D3 1.25 MG (50000 UT) Caps Take by mouth.   zinc sulfate 220 (50 Zn) MG capsule Take 220 mg by mouth every other day.       No orders  of the defined types were placed in this encounter.   Immunization History  Administered Date(s) Administered  . Influenza, High Dose Seasonal PF 12/20/2014  . Influenza, Seasonal, Injecte, Preservative Fre 10/10/2014  . Influenza-Unspecified 11/21/2016, 11/19/2017, 11/14/2018  . PPD Test 02/10/2016, 02/26/2016  . Pneumococcal Conjugate-13 12/20/2014  . Pneumococcal Polysaccharide-23 02/07/2017  . Td 08/26/2014    Social History   Tobacco Use  . Smoking status: Former Smoker    Quit date: 12/17/1990    Years since quitting: 28.2  . Smokeless tobacco: Never Used  Substance Use Topics  . Alcohol use: No    Review of Systems  GENERAL:  no fevers, fatigue, appetite changes SKIN: No itching, rash HEENT: No complaint RESPIRATORY: No cough, wheezing, SOB CARDIAC: No chest pain, palpitations, lower extremity edema  GI: No abdominal pain, No N/V/D or constipation, No  heartburn or reflux  GU: No dysuria, frequency or urgency, or incontinence  MUSCULOSKELETAL: No unrelieved bone/joint pain NEUROLOGIC: No headache, dizziness  PSYCHIATRIC: No overt anxiety or sadness  Vitals:   03/01/19 1055  BP: 103/61  Pulse: 100  Resp: 18  Temp: (!) 96.6 F (35.9 C)   Body mass index is 20.76 kg/m. Physical Exam  GENERAL APPEARANCE: Alert, conversant, No acute distress  SKIN: No diaphoresis rash HEENT: Unremarkable RESPIRATORY: Breathing is even, unlabored. Lung sounds are clear   CARDIOVASCULAR: Heart RRR no murmurs, rubs or gallops. No peripheral edema  GASTROINTESTINAL: Abdomen is soft, non-tender, not distended w/ normal bowel sounds.  GENITOURINARY: Bladder non tender, not distended  MUSCULOSKELETAL: No abnormal joints or musculature NEUROLOGIC: Cranial nerves 2-12 grossly intact. Moves all extremities PSYCHIATRIC: Mood and affect appropriate to situation with some dementia, no behavioral issues  Patient Active Problem List   Diagnosis Date Noted  . Dry eye syndrome 03/06/2019   . Allergic rhinitis 02/07/2019  . MRSA bacteremia 09/21/2018  . Hospice care patient 09/17/2018  . Dehydration 08/30/2018  . Real time reverse transcriptase PCR positive for COVID-19 virus 08/30/2018  . Bilateral pneumonia 07/26/2018  . Rhonchi 07/26/2018  . Xerosis cutis 07/26/2018  . Restless leg syndrome 05/19/2018  . Chronic generalized pain 05/19/2018  . Hypotension 08/31/2016  . Postural dizziness with presyncope 08/31/2016  . Chest pain 08/31/2016  . Paroxysmal atrial fibrillation (HCC) 08/31/2016  . Hypokalemia 08/31/2016  . Depression 08/31/2016  . Vitamin D deficiency 08/31/2016  . Mood disorder (HCC) 08/22/2016  . Necrotizing pneumonia (HCC) 05/10/2016  . COPD exacerbation (HCC) 05/10/2016  . UTI (urinary tract infection) 05/10/2016  . Paroxysmal atrial fibrillation with RVR (HCC) 05/10/2016  . Metabolic encephalopathy 05/10/2016  . Elevated amylase and lipase 02/13/2016  . Acute encephalopathy 02/13/2016  . 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 Dementia with behavioral disturbance (HCC) 02/13/2016  . Overactive bladder 02/13/2016  . Chronic kidney disease   . COPD (chronic obstructive pulmonary disease) (HCC)   . Anemia   . Anxiety   . Barrett's esophagus   . Hypothyroidism   . GERD (gastroesophageal reflux disease)   . Hypertension   . Memory difficulties   . Osteoporosis   . Psoriasis (a type of skin inflammation)     CMP     Component Value Date/Time   NA 141 01/26/2019 0000   NA 143 03/18/2017 0000   K 4.5 01/26/2019 0000   K 3.8 03/18/2017 0000   CL 107 01/26/2019 0000   CO2 25 (A) 01/26/2019 0000   GLUCOSE 115 (H) 01/26/2016 1935   BUN 21 01/26/2019 0000   CREATININE 0.8 01/26/2019 0000   CREATININE 0.64 03/18/2017 0000   CALCIUM 9.3 01/26/2019 0000   CALCIUM 9.2 03/18/2017 0000   ALBUMIN 3.7 01/26/2019 0000   ALBUMIN 3.8 03/18/2017 0000   AST 14 01/26/2019 0000   AST 9 03/18/2017 0000   ALT 9 01/26/2019 0000   ALT 7 03/18/2017 0000     ALKPHOS 78 01/26/2019 0000   ALKPHOS 99 03/18/2017 0000   BILITOT 0.2 03/18/2017 0000   GFRNONAA 69.65 01/26/2019 0000   GFRNONAA 85.41 03/18/2017 0000   GFRAA 80.72 01/26/2019 0000   Recent Labs    10/12/18 0000 10/22/18 0000 01/26/19 0000  NA 140 138 141  K 4.0 4.8 4.5  CL  --   --  107  CO2  --   --  25*  BUN 10 14 21   CREATININE 0.8 0.8 0.8  CALCIUM  --   --  9.3   Recent Labs    10/05/18 0000 10/12/18 0000 01/26/19 0000  AST 14 36* 14  ALT 11 29 9   ALKPHOS 100 104 78  ALBUMIN  --   --  3.7   Recent Labs    10/12/18 0000 10/22/18 0000 01/26/19 0000  WBC 5.6 5.2 6.8  NEUTROABS 3 3 2   HGB 10.5* 10.4* 11.3*  HCT 32* 31* 34*  PLT 333 235 267   No results for input(s): CHOL, LDLCALC, TRIG in the last 8760 hours.  Invalid input(s): HCL No results found for: [facility] Lab Results  Component Value Date   TSH 0.40 (A) 01/26/2019   Lab Results  Component Value Date   HGBA1C 5.4 12/22/2017   Lab Results  Component Value Date   CHOL 185 12/22/2017   HDL 34 (A) 12/22/2017   LDLCALC 110 12/22/2017   TRIG 203 (A) 12/22/2017    Significant Diagnostic Results in last 30 days:  No results found.  Assessment and Plan  Mood disorder ([place]) No reported problems; continue Effexor 75 mg daily  Restless leg syndrome No reported problems; continue Requip 0.5 mg twice daily  Dry eye syndrome No reported problems; continue Restasis 0.05% 1 drop both eyes twice daily and refresh 1 drop each eye 4 times daily    Hennie Duos, MD

## 2019-03-05 ENCOUNTER — Other Ambulatory Visit: Payer: Self-pay | Admitting: Internal Medicine

## 2019-03-06 ENCOUNTER — Encounter: Payer: Self-pay | Admitting: Internal Medicine

## 2019-03-06 DIAGNOSIS — H04129 Dry eye syndrome of unspecified lacrimal gland: Secondary | ICD-10-CM | POA: Insufficient documentation

## 2019-03-06 NOTE — Assessment & Plan Note (Signed)
No reported problems; continue Requip 0.5 mg twice daily

## 2019-03-06 NOTE — Assessment & Plan Note (Signed)
No reported problems; continue Effexor 75 mg daily

## 2019-03-06 NOTE — Assessment & Plan Note (Signed)
No reported problems; continue Restasis 0.05% 1 drop both eyes twice daily and refresh 1 drop each eye 4 times daily

## 2019-03-12 LAB — COMPREHENSIVE METABOLIC PANEL
Calcium: 8.9 (ref 8.7–10.7)
GFR calc Af Amer: 85.81
GFR calc non Af Amer: 74.04

## 2019-03-12 LAB — BASIC METABOLIC PANEL
BUN: 21 (ref 4–21)
CO2: 27 — AB (ref 13–22)
Chloride: 106 (ref 99–108)
Creatinine: 0.8 (ref 0.5–1.1)
Glucose: 91
Potassium: 4.7 (ref 3.4–5.3)
Sodium: 140 (ref 137–147)

## 2019-03-12 LAB — CBC: RBC: 3.03 — AB (ref 3.87–5.11)

## 2019-03-12 LAB — CBC AND DIFFERENTIAL
HCT: 29 — AB (ref 36–46)
Hemoglobin: 9.9 — AB (ref 12.0–16.0)
Neutrophils Absolute: 5
Platelets: 212 (ref 150–399)
WBC: 5.6

## 2019-03-22 ENCOUNTER — Encounter: Payer: Self-pay | Admitting: Internal Medicine

## 2019-03-22 NOTE — Progress Notes (Signed)
Location:  [facility] Room Number: 213-W Place of Service:  SNF [phone]) Provider:  Edmon Crape, PA-C   Patient Care Team: [name], MD as PCP - General (Internal Medicine)  Extended Emergency Contact Information Primary Emergency Contact: Hopkins,Tammy Address: [address] [address] [name] Home Phone: [phone] Relation: Niece Secondary Emergency Contact: Mirian Capuchin Address: [address]          [place], [place] [address] [name] Home Phone: [phone] Relation: Niece  Code Status:  DNR Goals of care: Advanced Directive information Advanced Directives 03/01/2019  Does Patient Have a Medical Advance Directive? Yes  Type of Advance Directive Out of facility DNR (pink MOST or yellow form)  Does patient want to make changes to medical advance directive? No - Patient declined  Copy of Healthcare Power of Attorney in Chart? -  Pre-existing out of facility DNR order (yellow form or pink MOST form) Yellow form placed in chart (order not valid for inpatient use)     Chief Complaint  Patient presents with  . Medical Management of Chronic Issues    Routine [facility] visit    HPI:  Pt is an 79 y.o. female seen today for medical management of chronic diseases.     Past Medical History:  Diagnosis Date  . Acute encephalopathy 02/13/2016  . Anemia   . Anxiety   . Arthritis   . Asthma   . Barrett's esophagus   . Chronic kidney disease   . COPD (chronic obstructive pulmonary disease) (HCC)   . Dementia with behavioral disturbance (HCC) 02/13/2016  . Depression   . Disease of thyroid gland   . Diverticulitis   . GERD (gastroesophageal reflux disease)   . Hyperlipemia   . Hypertension   . Memory difficulties   . Osteoporosis   . Pancreatitis   . Paroxysmal atrial fibrillation with RVR (HCC) 05/10/2016  . Psoriasis (a type of skin inflammation)   . Vertigo   . Vitamin D  deficiency 08/31/2016   Past Surgical History:  Procedure Laterality Date  . BACK SURGERY    . brain shun    . CHOLECYSTECTOMY    . ROTATOR CUFF REPAIR    . SHOULDER SURGERY    . UPPER GI ENDOSCOPY  12/19/2015   [facility] Endo, Include esophagus, stomach & duodenum or / Jejunum: Dx w/wo biopsys and dilatation; Surgeon : Leonia Reeves Rhoton MD    Allergies  Allergen Reactions  . Azithromycin Other (See Comments)    unknown  . Cephalexin Other (See Comments)    unknown  . Cephalosporins   . Ciprofloxacin Other (See Comments)    unknown  . Darvon [Propoxyphene]   . Flagyl [Metronidazole] Other (See Comments)    unknown  . Librax [Chlordiazepoxide-Clidinium] Other (See Comments)    unknown  . Macrodantin [Nitrofurantoin Macrocrystal]   . Nitrofuran Derivatives Other (See Comments)    unknown  . Other Other (See Comments)    unknown  . Oxycodone-Acetaminophen Other (See Comments)    unknown  . Percocet [Oxycodone-Acetaminophen]   . Quetiapine Other (See Comments)    unknown Potassium level dropped.  . Sulfa Antibiotics   . Sulfamethoxazole Other (See Comments)    unknown    Outpatient Encounter Medications as of 03/22/2019  Medication Sig  . acetaminophen (TYLENOL) 325 MG tablet Take 650 mg by mouth every 8 (eight) hours as needed.   Marland Kitchen  albuterol (VENTOLIN HFA) 108 (90 Base) MCG/ACT inhaler Inhale 2 puffs into the lungs every 6 (six) hours as needed for wheezing or shortness of breath.  Marland Kitchen ascorbic acid (VITAMIN C) 500 MG tablet Take 500 mg by mouth daily.  Marland Kitchen aspirin 81 MG chewable tablet Chew 81 mg by mouth daily.  . bisacodyl (DULCOLAX) 10 MG suppository Place 10 mg rectally daily as needed for moderate constipation (Constipation not relieved by MOM).  . budesonide-formoterol (SYMBICORT) 160-4.5 MCG/ACT inhaler Inhale 2 puffs into the lungs 2 (two) times daily.  . Carboxymeth-Glycerin-Polysorb (REFRESH OPTIVE ADVANCED OP) Apply to eye 4 (four) times daily. Apply to both eyes  .  cetirizine (ZYRTEC) 10 MG tablet Take 10 mg by mouth at bedtime.  . Cholecalciferol (VITAMIN D3) 1.25 MG (50000 UT) CAPS Take by mouth.  . cycloSPORINE (RESTASIS) 0.05 % ophthalmic emulsion Place 1 drop into both eyes 2 (two) times daily.  Marland Kitchen donepezil (ARICEPT) 10 MG tablet Take 10 mg by mouth at bedtime.  . famotidine (PEPCID) 20 MG tablet Take 20 mg by mouth at bedtime.   . fluticasone (FLONASE) 50 MCG/ACT nasal spray Place 2 sprays into both nostrils daily as needed for allergies.   Marland Kitchen guaiFENesin (ROBITUSSIN) 100 MG/5ML liquid Take 400 mg by mouth every 6 (six) hours as needed for cough.   Marland Kitchen HYDROcodone-acetaminophen (NORCO/VICODIN) 5-325 MG tablet Take 1 tablet by mouth every 8 (eight) hours.  Marland Kitchen levothyroxine (SYNTHROID, LEVOTHROID) 100 MCG tablet Take 100 mcg by mouth daily before breakfast.  . magnesium hydroxide (MILK OF MAGNESIA) 400 MG/5ML suspension Take 30 mLs by mouth daily as needed for mild constipation (If no BM in 3 days).  . mineral oil enema Place 1 enema rectally once. For constipation not relieved by Dulcolax suppository  . mirtazapine (REMERON) 15 MG tablet Take 7.5 mg by mouth at bedtime. 0.5 tablet to = 7.5  . Nutritional Supplements (ENSURE ENLIVE PO) Take 237 mLs by mouth 2 (two) times daily.  Marland Kitchen oxybutynin (DITROPAN) 5 MG tablet Take 5 mg by mouth 3 (three) times daily.  . potassium chloride SA (K-DUR) 20 MEQ tablet Take 20 mEq by mouth daily.  Marland Kitchen rOPINIRole (REQUIP) 0.5 MG tablet Take 0.5 mg by mouth 2 (two) times daily.  . Skin Protectants, Misc. (EUCERIN) cream Apply 1 application topically daily. Apply to face  . venlafaxine XR (EFFEXOR-XR) 75 MG 24 hr capsule take 1 tab po daily for depression  . zinc sulfate 220 (50 Zn) MG capsule Take 220 mg by mouth every other day.   No facility-administered encounter medications on file as of 03/22/2019.    Review of Systems  Immunization History  Administered Date(s) Administered  . Influenza, High Dose Seasonal PF  12/20/2014  . Influenza, Seasonal, Injecte, Preservative Fre 10/10/2014  . Influenza-Unspecified 11/21/2016, 11/19/2017, 11/14/2018  . PPD Test 02/10/2016, 02/26/2016  . Pneumococcal Conjugate-13 12/20/2014  . Pneumococcal Polysaccharide-23 02/07/2017  . Td 08/26/2014   Pertinent  Health Maintenance Due  Topic Date Due  . INFLUENZA VACCINE  Completed  . DEXA SCAN  Completed  . PNA vac Low Risk Adult  Completed   Fall Risk  07/31/2017 07/31/2016  Falls in the past year? No Yes  Number falls in past yr: - 2 or more  Injury with Fall? - No   Functional Status Survey:    Vitals:   03/22/19 1501  BP: 94/67  Pulse: 75  Resp: 17  Temp: (!) 96.8 F (36 C)  TempSrc: Oral  Weight: 117 lb  3.2 oz (53.2 kg)  Height: 5\' 3"  (1.6 m)   Body mass index is 20.76 kg/m. Physical Exam  Labs reviewed: Recent Labs    10/12/18 0000 10/22/18 0000 01/26/19 0000  NA 140 138 141  K 4.0 4.8 4.5  CL  --   --  107  CO2  --   --  25*  BUN 10 14 21   CREATININE 0.8 0.8 0.8  CALCIUM  --   --  9.3   Recent Labs    10/05/18 0000 10/12/18 0000 01/26/19 0000  AST 14 36* 14  ALT 11 29 9   ALKPHOS 100 104 78  ALBUMIN  --   --  3.7   Recent Labs    10/12/18 0000 10/22/18 0000 01/26/19 0000  WBC 5.6 5.2 6.8  NEUTROABS 3 3 2   HGB 10.5* 10.4* 11.3*  HCT 32* 31* 34*  PLT 333 235 267   Lab Results  Component Value Date   TSH 0.40 (A) 01/26/2019   Lab Results  Component Value Date   HGBA1C 5.4 12/22/2017   Lab Results  Component Value Date   CHOL 185 12/22/2017   HDL 34 (A) 12/22/2017   LDLCALC 110 12/22/2017   TRIG 203 (A) 12/22/2017    Significant Diagnostic Results in last 30 days:  No results found.

## 2019-03-23 ENCOUNTER — Encounter: Payer: Self-pay | Admitting: Internal Medicine

## 2019-03-23 ENCOUNTER — Non-Acute Institutional Stay (SKILLED_NURSING_FACILITY): Payer: 59 | Admitting: Internal Medicine

## 2019-03-23 DIAGNOSIS — I10 Essential (primary) hypertension: Secondary | ICD-10-CM | POA: Diagnosis not present

## 2019-03-23 DIAGNOSIS — K22719 Barrett's esophagus with dysplasia, unspecified: Secondary | ICD-10-CM | POA: Diagnosis not present

## 2019-03-23 DIAGNOSIS — J449 Chronic obstructive pulmonary disease, unspecified: Secondary | ICD-10-CM

## 2019-03-23 NOTE — Progress Notes (Signed)
Location:  [facility] Room Number: [id number] Place of Service:  SNF (31)  Hennie Duos, MD  Patient Care Team: Hennie Duos, MD as PCP - General (Internal Medicine)  Extended Emergency Contact Information Primary Emergency Contact: Hopkins,Tammy Address: [place] [address] [place] of [place] Phone: [phone] Relation: Niece Secondary Emergency Contact: Gara Kroner Address: [place],  [address] [place] of [place] Phone: [phone] Relation: Niece    Allergies: Azithromycin, Cephalexin, Cephalosporins, Ciprofloxacin, Darvon [propoxyphene], Flagyl [metronidazole], Librax [chlordiazepoxide-clidinium], Macrodantin [nitrofurantoin macrocrystal], Nitrofuran derivatives, Other, Oxycodone-acetaminophen, Percocet [oxycodone-acetaminophen], Quetiapine, Sulfa antibiotics, and Sulfamethoxazole  Chief Complaint  Patient presents with  . Medical Management of Chronic Issues    Routine Visit    HPI: Patient is 79 y.o. female who is being seen for routine issues of hypertension, COPD, and Barrett's esophagus.  Past Medical History:  Diagnosis Date  . Acute encephalopathy 02/13/2016  . Anemia   . Anxiety   . Arthritis   . Asthma   . Barrett's esophagus   . Chronic kidney disease   . COPD (chronic obstructive pulmonary disease) ([facility])   . Dementia with behavioral disturbance ([facility]) 02/13/2016  . Depression   . Disease of thyroid gland   . Diverticulitis   . GERD (gastroesophageal reflux disease)   . Hyperlipemia   . Hypertension   . Memory difficulties   . Osteoporosis   . Pancreatitis   . Paroxysmal atrial fibrillation with RVR ([facility]) 05/10/2016  . Psoriasis (a type of skin inflammation)   . Vertigo   . Vitamin D deficiency 08/31/2016    Past Surgical History:  Procedure Laterality Date  . BACK SURGERY    . brain shun    . CHOLECYSTECTOMY    . ROTATOR CUFF REPAIR     . SHOULDER SURGERY    . UPPER GI ENDOSCOPY  12/19/2015   [facility] End[facility], Include esophagus, stomach & duodenum or / Jejunum: Dx w/wo biopsys and dilatation; Surgeon : Cristie Hem Rhoton MD    Allergies as of 03/23/2019      Reactions   Azithromycin Other (See Comments)   unknown   Cephalexin Other (See Comments)   unknown   Cephalosporins    Ciprofloxacin Other (See Comments)   unknown   Darvon [propoxyphene]    Flagyl [metronidazole] Other (See Comments)   unknown   Librax [chlordiazepoxide-clidinium] Other (See Comments)   unknown   Macrodantin [nitrofurantoin Macrocrystal]    Nitrofuran Derivatives Other (See Comments)   unknown   Other Other (See Comments)   unknown   Oxycodone-acetaminophen Other (See Comments)   unknown   Percocet [oxycodone-acetaminophen]    Quetiapine Other (See Comments)   unknown Potassium level dropped.   Sulfa Antibiotics    Sulfamethoxazole Other (See Comments)   unknown      Medication List       Accurate as of March 23, 2019 11:59 PM. If you have any questions, ask your nurse or doctor.        acetaminophen 325 MG tablet Commonly known as: TYLENOL Take 650 mg by mouth every 8 (eight) hours as needed.   albuterol 108 (90 Base) MCG/ACT inhaler Commonly known as: VENTOLIN HFA Inhale 2 puffs into the lungs every 6 (six) hours as needed for wheezing or shortness of breath.   ascorbic acid 500 MG tablet Commonly known as: VITAMIN  C Take 500 mg by mouth daily.   aspirin 81 MG chewable tablet Chew 81 mg by mouth daily.   benzocaine 10 % mucosal gel Commonly known as: ORAJEL Use as directed 1 application in the mouth or throat in the morning, at noon, in the evening, and at bedtime. Apply to left lower gum QID prn for pain   bisacodyl 10 MG suppository Commonly known as: DULCOLAX Place 10 mg rectally daily as needed for moderate constipation (Constipation not relieved by MOM).   budesonide-formoterol 160-4.5 MCG/ACT  inhaler Commonly known as: SYMBICORT Inhale 2 puffs into the lungs 2 (two) times daily.   cetirizine 10 MG tablet Commonly known as: ZYRTEC Take 10 mg by mouth at bedtime.   donepezil 10 MG tablet Commonly known as: ARICEPT Take 10 mg by mouth at bedtime.   ENSURE ENLIVE PO Take 237 mLs by mouth 2 (two) times daily.   eucerin cream Apply 1 application topically daily. Apply to face   famotidine 20 MG tablet Commonly known as: PEPCID Take 20 mg by mouth at bedtime.   fluticasone 50 MCG/ACT nasal spray Commonly known as: FLONASE Place 2 sprays into both nostrils daily as needed for allergies.   guaiFENesin 100 MG/5ML liquid Commonly known as: ROBITUSSIN Take 400 mg by mouth every 6 (six) hours as needed for cough.   HYDROcodone-acetaminophen 5-325 MG tablet Commonly known as: NORCO/VICODIN Take 1 tablet by mouth every 8 (eight) hours.   levothyroxine 100 MCG tablet Commonly known as: SYNTHROID Take 100 mcg by mouth daily before breakfast.   magnesium hydroxide 400 MG/5ML suspension Commonly known as: MILK OF MAGNESIA Take 30 mLs by mouth daily as needed for mild constipation (If no BM in 3 days).   mineral oil enema Place 1 enema rectally once. For constipation not relieved by Dulcolax suppository   mirtazapine 15 MG tablet Commonly known as: REMERON Take 7.5 mg by mouth at bedtime. 0.5 tablet to = 7.5   oxybutynin 5 MG tablet Commonly known as: DITROPAN Take 5 mg by mouth 3 (three) times daily.   potassium chloride SA 20 MEQ tablet Commonly known as: KLOR-CON Take 20 mEq by mouth daily.   REFRESH OPTIVE ADVANCED OP Apply to eye 4 (four) times daily. Apply to both eyes   Restasis 0.05 % ophthalmic emulsion Generic drug: cycloSPORINE Place 1 drop into both eyes 2 (two) times daily.   rOPINIRole 0.5 MG tablet Commonly known as: REQUIP Take 0.5 mg by mouth 2 (two) times daily.   venlafaxine XR 75 MG 24 hr capsule Commonly known as: EFFEXOR-XR take 1  tab po daily for depression   Vitamin D3 1.25 MG (50000 UT) Caps Take 1 capsule by mouth daily.   zinc sulfate 220 (50 Zn) MG capsule Take 220 mg by mouth every other day.       No orders of the defined types were placed in this encounter.   Immunization History  Administered Date(s) Administered  . Influenza, High Dose Seasonal PF 12/20/2014  . Influenza, Seasonal, Injecte, Preservative Fre 10/10/2014  . Influenza-Unspecified 11/21/2016, 11/19/2017, 11/14/2018  . Moderna SARS-COVID-2 Vaccination 03/11/2019  . PPD Test 02/10/2016, 02/26/2016  . Pneumococcal Conjugate-13 12/20/2014  . Pneumococcal Polysaccharide-23 02/07/2017  . Td 08/26/2014    Social History   Tobacco Use  . Smoking status: Former Smoker    Quit date: 12/17/1990    Years since quitting: 28.2  . Smokeless tobacco: Never Used  Substance Use Topics  . Alcohol use: No  Review of Systems   GENERAL:  no fevers, fatigue, appetite changes SKIN: No itching, rash HEENT: No complaint RESPIRATORY: No cough, wheezing, SOB CARDIAC: No chest pain, palpitations, lower extremity edema  GI: No abdominal pain, No N/V/D or constipation, No heartburn or reflux  GU: No dysuria, frequency or urgency, or incontinence  MUSCULOSKELETAL: No unrelieved bone/joint pain NEUROLOGIC: No headache, dizziness  PSYCHIATRIC: No overt anxiety or sadness  Vitals:   03/23/19 1055  BP: 113/67  Pulse: 79  Resp: 18  Temp: (!) 97 F (36.1 C)   Body mass index is 20.76 kg/m. Physical Exam  GENERAL APPEARANCE: Alert, conversant, No acute distress  SKIN: No diaphoresis rash HEENT: Unremarkable RESPIRATORY: Breathing is even, unlabored. Lung sounds are clear   CARDIOVASCULAR: Heart RRR no murmurs, rubs or gallops. No peripheral edema  GASTROINTESTINAL: Abdomen is soft, non-tender, not distended w/ normal bowel sounds.  GENITOURINARY: Bladder non tender, not distended  MUSCULOSKELETAL: No abnormal joints or  musculature NEUROLOGIC: Cranial nerves 2-12 grossly intact. Moves all extremities PSYCHIATRIC: Mood and affect appropriate to situation mild dementia, no behavioral issues  Patient Active Problem List   Diagnosis Date Noted  . Dry eye syndrome 03/06/2019  . Allergic rhinitis 02/07/2019  . MRSA bacteremia 09/21/2018  . Hospice care patient 09/17/2018  . Dehydration 08/30/2018  . Real time reverse transcriptase PCR positive for COVID-19 virus 08/30/2018  . Bilateral pneumonia 07/26/2018  . Rhonchi 07/26/2018  . Xerosis cutis 07/26/2018  . Restless leg syndrome 05/19/2018  . Chronic generalized pain 05/19/2018  . Hypotension 08/31/2016  . Postural dizziness with presyncope 08/31/2016  . Chest pain 08/31/2016  . Paroxysmal atrial fibrillation (HCC) 08/31/2016  . Hypokalemia 08/31/2016  . Depression 08/31/2016  . Vitamin D deficiency 08/31/2016  . Mood disorder (HCC) 08/22/2016  . Necrotizing pneumonia (HCC) 05/10/2016  . COPD exacerbation (HCC) 05/10/2016  . UTI (urinary tract infection) 05/10/2016  . Paroxysmal atrial fibrillation with RVR (HCC) 05/10/2016  . Metabolic encephalopathy 05/10/2016  . Elevated amylase and lipase 02/13/2016  . Acute encephalopathy 02/13/2016  . 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 Dementia with behavioral disturbance (HCC) 02/13/2016  . Overactive bladder 02/13/2016  . Chronic kidney disease   . COPD (chronic obstructive pulmonary disease) (HCC)   . Anemia   . Anxiety   . Barrett's esophagus   . Hypothyroidism   . GERD (gastroesophageal reflux disease)   . Hypertension   . Memory difficulties   . Osteoporosis   . Psoriasis (a type of skin inflammation)     CMP     Component Value Date/Time   NA 141 01/26/2019 0000   NA 143 03/18/2017 0000   K 4.5 01/26/2019 0000   K 3.8 03/18/2017 0000   CL 107 01/26/2019 0000   CO2 25 (A) 01/26/2019 0000   GLUCOSE 115 (H) 01/26/2016 1935   BUN 21 01/26/2019 0000   CREATININE 0.8 01/26/2019 0000    CREATININE 0.64 03/18/2017 0000   CALCIUM 9.3 01/26/2019 0000   CALCIUM 9.2 03/18/2017 0000   ALBUMIN 3.7 01/26/2019 0000   ALBUMIN 3.8 03/18/2017 0000   AST 14 01/26/2019 0000   AST 9 03/18/2017 0000   ALT 9 01/26/2019 0000   ALT 7 03/18/2017 0000   ALKPHOS 78 01/26/2019 0000   ALKPHOS 99 03/18/2017 0000   BILITOT 0.2 03/18/2017 0000   GFRNONAA 69.65 01/26/2019 0000   GFRNONAA 85.41 03/18/2017 0000   GFRAA 80.72 01/26/2019 0000   Recent Labs    10/12/18 0000 10/22/18 0000  01/26/19 0000  NA 140 138 141  K 4.0 4.8 4.5  CL  --   --  107  CO2  --   --  25*  BUN 10 14 21   CREATININE 0.8 0.8 0.8  CALCIUM  --   --  9.3   Recent Labs    10/05/18 0000 10/12/18 0000 01/26/19 0000  AST 14 36* 14  ALT 11 29 9   ALKPHOS 100 104 78  ALBUMIN  --   --  3.7   Recent Labs    10/12/18 0000 10/22/18 0000 01/26/19 0000  WBC 5.6 5.2 6.8  NEUTROABS 3 3 2   HGB 10.5* 10.4* 11.3*  HCT 32* 31* 34*  PLT 333 235 267   No results for input(s): CHOL, LDLCALC, TRIG in the last 8760 hours.  Invalid input(s): HCL No results found for: [facility] Lab Results  Component Value Date   TSH 0.40 (A) 01/26/2019   Lab Results  Component Value Date   HGBA1C 5.4 12/22/2017   Lab Results  Component Value Date   CHOL 185 12/22/2017   HDL 34 (A) 12/22/2017   LDLCALC 110 12/22/2017   TRIG 203 (A) 12/22/2017    Significant Diagnostic Results in last 30 days:  No results found.  Assessment and Plan  Hypertension Controlled on the medication; will continue to monitor  COPD (chronic obstructive pulmonary disease) (HCC) No reported exacerbations; continue theophylline 100 mg twice daily, guaifenesin 200 mg twice daily, and as needed DuoNeb and albuterol neb  Barrett's esophagus Appears chronic and stable; continue Zantac 75 mg nightly and Protonix 40 mg twice daily    13/12/2017, MD

## 2019-03-26 NOTE — Progress Notes (Signed)
This encounter was created in error - please disregard.

## 2019-03-28 ENCOUNTER — Encounter: Payer: Self-pay | Admitting: Internal Medicine

## 2019-03-28 NOTE — Assessment & Plan Note (Signed)
Appears chronic and stable; continue Zantac 75 mg nightly and Protonix 40 mg twice daily

## 2019-03-28 NOTE — Assessment & Plan Note (Signed)
No reported exacerbations; continue theophylline 100 mg twice daily, guaifenesin 200 mg twice daily, and as needed DuoNeb and albuterol neb

## 2019-03-28 NOTE — Assessment & Plan Note (Signed)
Controlled on the medication; will continue to monitor

## 2019-04-05 ENCOUNTER — Other Ambulatory Visit: Payer: Self-pay | Admitting: Internal Medicine

## 2019-04-05 MED ORDER — HYDROCODONE-ACETAMINOPHEN 5-325 MG PO TABS: 1.0000 | ORAL_TABLET | Freq: Three times a day (TID) | ORAL | 0 refills | Status: AC

## 2019-04-07 LAB — VITAMIN D 25 HYDROXY (VIT D DEFICIENCY, FRACTURES): Vit D, 25-Hydroxy: 35.19

## 2019-04-19 ENCOUNTER — Non-Acute Institutional Stay (SKILLED_NURSING_FACILITY): Payer: 59 | Admitting: Internal Medicine

## 2019-04-19 ENCOUNTER — Encounter: Payer: Self-pay | Admitting: Internal Medicine

## 2019-04-19 DIAGNOSIS — E034 Atrophy of thyroid (acquired): Secondary | ICD-10-CM | POA: Diagnosis not present

## 2019-04-19 DIAGNOSIS — I480 Paroxysmal atrial fibrillation: Secondary | ICD-10-CM | POA: Diagnosis not present

## 2019-04-19 DIAGNOSIS — K2100 Gastro-esophageal reflux disease with esophagitis, without bleeding: Secondary | ICD-10-CM | POA: Diagnosis not present

## 2019-04-19 NOTE — Progress Notes (Signed)
Location:  [facility] Room Number: 213-W Place of Service:  SNF (31)  [name], MD  Patient Care Team: [name], MD as PCP - General (Internal Medicine)  Extended Emergency Contact Information Primary Emergency Contact: Hopkins,Tammy Address: [address] [address] [name] Home Phone: [phone] Relation: Niece Secondary Emergency Contact: Mirian Capuchin Address: [address]          [place], [place] [address] [place] Home Phone: [phone] Relation: Niece    Allergies: Azithromycin, Cephalexin, Cephalosporins, Ciprofloxacin, Darvon [propoxyphene], Flagyl [metronidazole], Librax [chlordiazepoxide-clidinium], Macrodantin [nitrofurantoin macrocrystal], Nitrofuran derivatives, Other, Oxycodone-acetaminophen, Percocet [oxycodone-acetaminophen], Quetiapine, Sulfa antibiotics, and Sulfamethoxazole  Chief Complaint  Patient presents with  . Medical Management of Chronic Issues    Routine [facility] visit    HPI: Patient is an 79 y.o. female who is being seen for routine issues of paroxysmal atrial fibrillation, GERD, and hypothyroidism.  Past Medical History:  Diagnosis Date  . Acute encephalopathy 02/13/2016  . Anemia   . Anxiety   . Arthritis   . Asthma   . Barrett's esophagus   . Chronic kidney disease   . COPD (chronic obstructive pulmonary disease) (HCC)   . Dementia with behavioral disturbance (HCC) 02/13/2016  . Depression   . Disease of thyroid gland   . Diverticulitis   . GERD (gastroesophageal reflux disease)   . Hyperlipemia   . Hypertension   . Memory difficulties   . Osteoporosis   . Pancreatitis   . Paroxysmal atrial fibrillation with RVR (HCC) 05/10/2016  . Psoriasis (a type of skin inflammation)   . Vertigo   . Vitamin D deficiency 08/31/2016    Past Surgical History:  Procedure Laterality Date  . BACK SURGERY    . brain shun    . CHOLECYSTECTOMY     . ROTATOR CUFF REPAIR    . SHOULDER SURGERY    . UPPER GI ENDOSCOPY  12/19/2015   [facility] Endo, Include esophagus, stomach & duodenum or / Jejunum: Dx w/wo biopsys and dilatation; Surgeon : Leonia Reeves Rhoton MD    Allergies as of 04/19/2019      Reactions   Azithromycin Other (See Comments)   unknown   Cephalexin Other (See Comments)   unknown   Cephalosporins    Ciprofloxacin Other (See Comments)   unknown   Darvon [propoxyphene]    Flagyl [metronidazole] Other (See Comments)   unknown   Librax [chlordiazepoxide-clidinium] Other (See Comments)   unknown   Macrodantin [nitrofurantoin Macrocrystal]    Nitrofuran Derivatives Other (See Comments)   unknown   Other Other (See Comments)   unknown   Oxycodone-acetaminophen Other (See Comments)   unknown   Percocet [oxycodone-acetaminophen]    Quetiapine Other (See Comments)   unknown Potassium level dropped.   Sulfa Antibiotics    Sulfamethoxazole Other (See Comments)   unknown      Medication List       Accurate as of April 19, 2019 11:59 PM. If you have any questions, ask your nurse or doctor.        STOP taking these medications   REFRESH OPTIVE ADVANCED OP Stopped by: Merrilee Seashore, MD   Restasis 0.05 % ophthalmic emulsion Generic drug: cycloSPORINE Stopped by: Merrilee Seashore, MD     TAKE these medications   acetaminophen 325 MG tablet Commonly known as: TYLENOL Take 650 mg by mouth every 8 (eight) hours as needed.  albuterol 108 (90 Base) MCG/ACT inhaler Commonly known as: VENTOLIN HFA Inhale 2 puffs into the lungs every 6 (six) hours as needed for wheezing or shortness of breath.   aspirin 81 MG chewable tablet Chew 81 mg by mouth daily.   benzocaine 10 % mucosal gel Commonly known as: ORAJEL Use as directed 1 application in the mouth or throat in the morning, at noon, in the evening, and at bedtime. Apply to left lower gum QID prn for pain   bisacodyl 10 MG suppository Commonly known as: DULCOLAX  Place 10 mg rectally daily as needed for moderate constipation (Constipation not relieved by MOM).   budesonide-formoterol 160-4.5 MCG/ACT inhaler Commonly known as: SYMBICORT Inhale 2 puffs into the lungs 2 (two) times daily.   cetirizine 10 MG tablet Commonly known as: ZYRTEC Take 10 mg by mouth at bedtime.   donepezil 10 MG tablet Commonly known as: ARICEPT Take 10 mg by mouth at bedtime.   ENSURE ENLIVE PO Take 237 mLs by mouth 2 (two) times daily.   eucerin cream Apply 1 application topically daily. Apply to face   famotidine 20 MG tablet Commonly known as: PEPCID Take 20 mg by mouth at bedtime.   fluticasone 50 MCG/ACT nasal spray Commonly known as: FLONASE Place 2 sprays into both nostrils daily as needed for allergies.   guaiFENesin 100 MG/5ML liquid Commonly known as: ROBITUSSIN Take 400 mg by mouth every 6 (six) hours as needed for cough.   HYDROcodone-acetaminophen 5-325 MG tablet Commonly known as: NORCO/VICODIN Take 1 tablet by mouth every 8 (eight) hours.   levothyroxine 100 MCG tablet Commonly known as: SYNTHROID Take 100 mcg by mouth daily before breakfast.   magnesium hydroxide 400 MG/5ML suspension Commonly known as: MILK OF MAGNESIA Take 30 mLs by mouth daily as needed for mild constipation (If no BM in 3 days).   mineral oil enema Place 1 enema rectally once. For constipation not relieved by Dulcolax suppository   mirtazapine 15 MG tablet Commonly known as: REMERON Take 7.5 mg by mouth at bedtime. 0.5 tablet to = 7.5   oxybutynin 5 MG tablet Commonly known as: DITROPAN Take 5 mg by mouth 2 (two) times daily.   potassium chloride SA 20 MEQ tablet Commonly known as: KLOR-CON Take 20 mEq by mouth daily.   rOPINIRole 0.5 MG tablet Commonly known as: REQUIP Take 0.5 mg by mouth 2 (two) times daily.   venlafaxine XR 75 MG 24 hr capsule Commonly known as: EFFEXOR-XR take 1 tab po daily for depression       No orders of the defined  types were placed in this encounter.   Immunization History  Administered Date(s) Administered  . Influenza, High Dose Seasonal PF 12/20/2014  . Influenza, Seasonal, Injecte, Preservative Fre 10/10/2014  . Influenza-Unspecified 11/21/2016, 11/19/2017, 11/14/2018  . Moderna SARS-COVID-2 Vaccination 03/11/2019  . PPD Test 02/10/2016, 02/26/2016  . Pneumococcal Conjugate-13 12/20/2014  . Pneumococcal Polysaccharide-23 02/07/2017  . Td 08/26/2014    Social History   Tobacco Use  . Smoking status: Former Smoker    Quit date: 12/17/1990    Years since quitting: 28.3  . Smokeless tobacco: Never Used  Substance Use Topics  . Alcohol use: No    Review of Systems  GENERAL:  no fevers, fatigue, appetite changes SKIN: No itching, rash HEENT: No complaint RESPIRATORY: No cough, wheezing, SOB CARDIAC: No chest pain, palpitations, lower extremity edema  GI: No abdominal pain, No N/V/D or constipation, No heartburn or reflux  GU: No  dysuria, frequency or urgency, or incontinence  MUSCULOSKELETAL: No unrelieved bone/joint pain NEUROLOGIC: No headache, dizziness  PSYCHIATRIC: No overt anxiety or sadness  Vitals:   04/19/19 1508  BP: 94/78  Pulse: 81  Resp: 20  Temp: (!) 97.1 F (36.2 C)   Body mass index is 20.71 kg/m. Physical Exam  GENERAL APPEARANCE: Alert, conversant, No acute distress  SKIN: No diaphoresis rash HEENT: Unremarkable RESPIRATORY: Breathing is even, unlabored. Lung sounds are clear   CARDIOVASCULAR: Heart RRR no murmurs, rubs or gallops. No peripheral edema  GASTROINTESTINAL: Abdomen is soft, non-tender, not distended w/ normal bowel sounds.  GENITOURINARY: Bladder non tender, not distended  MUSCULOSKELETAL: No abnormal joints or musculature NEUROLOGIC: Cranial nerves 2-12 grossly intact. Moves all extremities PSYCHIATRIC: Mood and affect appropriate to situation with mild dementia, no behavioral issues  Patient Active Problem List   Diagnosis Date Noted   . Dry eye syndrome 03/06/2019  . Allergic rhinitis 02/07/2019  . MRSA bacteremia 09/21/2018  . Hospice care patient 09/17/2018  . Dehydration 08/30/2018  . Real time reverse transcriptase PCR positive for COVID-19 virus 08/30/2018  . Bilateral pneumonia 07/26/2018  . Rhonchi 07/26/2018  . Xerosis cutis 07/26/2018  . Restless leg syndrome 05/19/2018  . Chronic generalized pain 05/19/2018  . Hypotension 08/31/2016  . Postural dizziness with presyncope 08/31/2016  . Chest pain 08/31/2016  . Paroxysmal atrial fibrillation ([place]) 08/31/2016  . Hypokalemia 08/31/2016  . Depression 08/31/2016  . Vitamin D deficiency 08/31/2016  . Mood disorder ([place]) 08/22/2016  . Necrotizing pneumonia ([place]) 05/10/2016  . COPD exacerbation ([place]) 05/10/2016  . UTI (urinary tract infection) 05/10/2016  . Paroxysmal atrial fibrillation with RVR ([place]) 05/10/2016  . Metabolic encephalopathy 32/44/0102  . Elevated amylase and lipase 02/13/2016  . Acute encephalopathy 02/13/2016  . Psychosis in elderly ([place]) 02/13/2016  . Dementia with behavioral disturbance ([place]) 02/13/2016  . Overactive bladder 02/13/2016  . Chronic kidney disease   . COPD (chronic obstructive pulmonary disease) ([place])   . Anemia   . Anxiety   . Barrett's esophagus   . Hypothyroidism   . GERD (gastroesophageal reflux disease)   . Hypertension   . Memory difficulties   . Osteoporosis   . Psoriasis (a type of skin inflammation)     CMP     Component Value Date/Time   NA 141 01/26/2019 0000   NA 143 03/18/2017 0000   K 4.5 01/26/2019 0000   K 3.8 03/18/2017 0000   CL 107 01/26/2019 0000   CO2 25 (A) 01/26/2019 0000   GLUCOSE 115 (H) 01/26/2016 1935   BUN 21 01/26/2019 0000   CREATININE 0.8 01/26/2019 0000   CREATININE 0.64 03/18/2017 0000   CALCIUM 9.3 01/26/2019 0000   CALCIUM 9.2 03/18/2017 0000   ALBUMIN 3.7 01/26/2019 0000   ALBUMIN 3.8 03/18/2017 0000   AST 14 01/26/2019 0000   AST 9 03/18/2017 0000   ALT 9 01/26/2019  0000   ALT 7 03/18/2017 0000   ALKPHOS 78 01/26/2019 0000   ALKPHOS 99 03/18/2017 0000   BILITOT 0.2 03/18/2017 0000   GFRNONAA 69.65 01/26/2019 0000   GFRNONAA 85.41 03/18/2017 0000   GFRAA 80.72 01/26/2019 0000   Recent Labs    10/12/18 0000 10/22/18 0000 01/26/19 0000  NA 140 138 141  K 4.0 4.8 4.5  CL  --   --  107  CO2  --   --  25*  BUN 10 14 21   CREATININE 0.8 0.8 0.8  CALCIUM  --   --  9.3   Recent Labs    10/05/18 0000 10/12/18 0000 01/26/19 0000  AST 14 36* 14  ALT 11 29 9   ALKPHOS 100 104 78  ALBUMIN  --   --  3.7   Recent Labs    10/12/18 0000 10/22/18 0000 01/26/19 0000  WBC 5.6 5.2 6.8  NEUTROABS 3 3 2   HGB 10.5* 10.4* 11.3*  HCT 32* 31* 34*  PLT 333 235 267   No results for input(s): CHOL, LDLCALC, TRIG in the last 8760 hours.  Invalid input(s): HCL No results found for: [facility] Lab Results  Component Value Date   TSH 0.40 (A) 01/26/2019   Lab Results  Component Value Date   HGBA1C 5.4 12/22/2017   Lab Results  Component Value Date   CHOL 185 12/22/2017   HDL 34 (A) 12/22/2017   LDLCALC 110 12/22/2017   TRIG 203 (A) 12/22/2017    Significant Diagnostic Results in last 30 days:  No results found.  Assessment and Plan  Paroxysmal atrial fibrillation (HCC) Patient on MS for rate or prophylaxis; continue to monitor  GERD (gastroesophageal reflux disease) No reported problems; continue Pepcid 20 mg daily  Hypothyroidism TSH is 0.40 which is too low; decrease Synthroid to 75 mcg daily and repeat TSH in 6 weeks    13/12/2017, MD

## 2019-04-25 ENCOUNTER — Encounter: Payer: Self-pay | Admitting: Internal Medicine

## 2019-04-25 NOTE — Assessment & Plan Note (Signed)
Patient on MS for rate or prophylaxis; continue to monitor

## 2019-04-25 NOTE — Assessment & Plan Note (Signed)
No reported problems; continue Pepcid 20 mg daily

## 2019-04-25 NOTE — Assessment & Plan Note (Signed)
TSH is 0.40 which is too low; decrease Synthroid to 75 mcg daily and repeat TSH in 6 weeks

## 2019-04-27 LAB — BASIC METABOLIC PANEL
BUN: 18 (ref 4–21)
CO2: 26 — AB (ref 13–22)
Chloride: 104 (ref 99–108)
Creatinine: 0.7 (ref 0.5–1.1)
Glucose: 100
Potassium: 4.1 (ref 3.4–5.3)
Sodium: 139 (ref 137–147)

## 2019-04-27 LAB — HEPATIC FUNCTION PANEL
ALT: 11 (ref 7–35)
AST: 16 (ref 13–35)
Alkaline Phosphatase: 95 (ref 25–125)
Bilirubin, Total: 0.5

## 2019-04-27 LAB — COMPREHENSIVE METABOLIC PANEL
Albumin: 3.6 (ref 3.5–5.0)
Calcium: 8.9 (ref 8.7–10.7)

## 2019-04-29 LAB — CBC AND DIFFERENTIAL
HCT: 34 — AB (ref 36–46)
Hemoglobin: 11.3 — AB (ref 12.0–16.0)
Neutrophils Absolute: 7
Platelets: 306 (ref 150–399)
WBC: 9.9

## 2019-04-29 LAB — CBC: RBC: 3.58 — AB (ref 3.87–5.11)

## 2019-06-01 ENCOUNTER — Non-Acute Institutional Stay (SKILLED_NURSING_FACILITY): Payer: Medicare Other | Admitting: Internal Medicine

## 2019-06-01 ENCOUNTER — Encounter: Payer: Self-pay | Admitting: Internal Medicine

## 2019-06-01 DIAGNOSIS — F0281 Dementia in other diseases classified elsewhere with behavioral disturbance: Secondary | ICD-10-CM

## 2019-06-01 DIAGNOSIS — G301 Alzheimer's disease with late onset: Secondary | ICD-10-CM

## 2019-06-01 DIAGNOSIS — F02818 Dementia in other diseases classified elsewhere, unspecified severity, with other behavioral disturbance: Secondary | ICD-10-CM

## 2019-06-01 DIAGNOSIS — N3281 Overactive bladder: Secondary | ICD-10-CM | POA: Diagnosis not present

## 2019-06-01 DIAGNOSIS — J3089 Other allergic rhinitis: Secondary | ICD-10-CM

## 2019-06-01 NOTE — Progress Notes (Signed)
Location:  [facility] Room Number: 213-W Place of Service:  SNF (31)  Rachel Hanks, MD  Patient Care Team: Rachel Hanks, MD as PCP - General (Internal Medicine)  Extended Emergency Contact Information Primary Emergency Contact: Hopkins,Tammy Address: [address] [address] [name] Home Phone: [phone] Relation: Niece Secondary Emergency Contact: Mirian Capuchin Address: [address]          [place], [place] [address] [place] Home Phone: [phone] Relation: Niece    Allergies: Azithromycin, Cephalexin, Cephalosporins, Ciprofloxacin, Darvon [propoxyphene], Flagyl [metronidazole], Librax [chlordiazepoxide-clidinium], Macrodantin [nitrofurantoin macrocrystal], Nitrofuran derivatives, Other, Oxycodone-acetaminophen, Percocet [oxycodone-acetaminophen], Quetiapine, Sulfa antibiotics, and Sulfamethoxazole  Chief Complaint  Patient presents with  . Medical Management of Chronic Issues    Routine [facility] visit    HPI: Patient is an 79 y.o. female who is being seen for routine issues of allergic rhinitis, dementia, and overactive bladder.  Past Medical History:  Diagnosis Date  . Acute encephalopathy 02/13/2016  . Anemia   . Anxiety   . Arthritis   . Asthma   . Barrett's esophagus   . Chronic kidney disease   . COPD (chronic obstructive pulmonary disease) (HCC)   . Dementia with behavioral disturbance (HCC) 02/13/2016  . Depression   . Disease of thyroid gland   . Diverticulitis   . GERD (gastroesophageal reflux disease)   . Hyperlipemia   . Hypertension   . Memory difficulties   . Osteoporosis   . Pancreatitis   . Paroxysmal atrial fibrillation with RVR (HCC) 05/10/2016  . Psoriasis (a type of skin inflammation)   . Vertigo   . Vitamin D deficiency 08/31/2016    Past Surgical History:  Procedure Laterality Date  . BACK SURGERY    . brain shun    .  CHOLECYSTECTOMY    . ROTATOR CUFF REPAIR    . SHOULDER SURGERY    . UPPER GI ENDOSCOPY  12/19/2015   [facility] Endo, Include esophagus, stomach & duodenum or / Jejunum: Dx w/wo biopsys and dilatation; Surgeon : Leonia Reeves Rhoton MD    Allergies as of 06/01/2019      Reactions   Azithromycin Other (See Comments)   unknown   Cephalexin Other (See Comments)   unknown   Cephalosporins    Ciprofloxacin Other (See Comments)   unknown   Darvon [propoxyphene]    Flagyl [metronidazole] Other (See Comments)   unknown   Librax [chlordiazepoxide-clidinium] Other (See Comments)   unknown   Macrodantin [nitrofurantoin Macrocrystal]    Nitrofuran Derivatives Other (See Comments)   unknown   Other Other (See Comments)   unknown   Oxycodone-acetaminophen Other (See Comments)   unknown   Percocet [oxycodone-acetaminophen]    Quetiapine Other (See Comments)   unknown Potassium level dropped.   Sulfa Antibiotics    Sulfamethoxazole Other (See Comments)   unknown      Medication List       Accurate as of June 01, 2019 11:59 PM. If you have any questions, ask your nurse or doctor.        STOP taking these medications   eucerin cream Stopped by: Merrilee Seashore, MD     TAKE these medications   acetaminophen 325 MG tablet Commonly known as: TYLENOL Take 650 mg by mouth every 8 (eight) hours as needed.   albuterol 108 (90 Base) MCG/ACT inhaler Commonly known as: VENTOLIN HFA Inhale 2  puffs into the lungs every 6 (six) hours as needed for wheezing or shortness of breath.   aspirin 81 MG chewable tablet Chew 81 mg by mouth daily.   benzocaine 10 % mucosal gel Commonly known as: ORAJEL Use as directed 1 application in the mouth or throat in the morning, at noon, in the evening, and at bedtime. Apply to left lower gum QID prn for pain   bisacodyl 10 MG suppository Commonly known as: DULCOLAX Place 10 mg rectally daily as needed for moderate constipation (Constipation not relieved by  MOM).   budesonide-formoterol 160-4.5 MCG/ACT inhaler Commonly known as: SYMBICORT Inhale 2 puffs into the lungs 2 (two) times daily.   cetirizine 10 MG tablet Commonly known as: ZYRTEC Take 10 mg by mouth at bedtime.   donepezil 10 MG tablet Commonly known as: ARICEPT Take 10 mg by mouth at bedtime.   ENSURE ENLIVE PO Take 237 mLs by mouth 2 (two) times daily.   famotidine 20 MG tablet Commonly known as: PEPCID Take 20 mg by mouth at bedtime.   fluticasone 50 MCG/ACT nasal spray Commonly known as: FLONASE Place 2 sprays into both nostrils daily as needed for allergies.   guaiFENesin 100 MG/5ML liquid Commonly known as: ROBITUSSIN Take 400 mg by mouth every 6 (six) hours as needed for cough.   HYDROcodone-acetaminophen 5-325 MG tablet Commonly known as: NORCO/VICODIN Take 1 tablet by mouth every 8 (eight) hours.   levothyroxine 100 MCG tablet Commonly known as: SYNTHROID Take 100 mcg by mouth daily before breakfast.   magnesium hydroxide 400 MG/5ML suspension Commonly known as: MILK OF MAGNESIA Take 30 mLs by mouth daily as needed for mild constipation (If no BM in 3 days).   mineral oil enema Place 1 enema rectally once. For constipation not relieved by Dulcolax suppository   mirtazapine 15 MG tablet Commonly known as: REMERON Take 7.5 mg by mouth at bedtime. 0.5 tablet to = 7.5   oxybutynin 5 MG tablet Commonly known as: DITROPAN Take 5 mg by mouth 2 (two) times daily.   potassium chloride SA 20 MEQ tablet Commonly known as: KLOR-CON Take 20 mEq by mouth daily.   REFRESH OPTIVE ADVANCED OP Apply to eye in the morning, at noon, in the evening, and at bedtime.   Restasis 0.05 % ophthalmic emulsion Generic drug: cycloSPORINE Place 1 drop into both eyes 2 (two) times daily.   rOPINIRole 0.5 MG tablet Commonly known as: REQUIP Take 0.5 mg by mouth 2 (two) times daily.   senna-docusate 8.6-50 MG tablet Commonly known as: Senokot-S Take 1 tablet by  mouth at bedtime.   venlafaxine XR 75 MG 24 hr capsule Commonly known as: EFFEXOR-XR take 1 tab po daily for depression       No orders of the defined types were placed in this encounter.   Immunization History  Administered Date(s) Administered  . Influenza, High Dose Seasonal PF 12/20/2014  . Influenza, Seasonal, Injecte, Preservative Fre 10/10/2014  . Influenza-Unspecified 11/21/2016, 11/19/2017, 11/14/2018  . Moderna SARS-COVID-2 Vaccination 02/11/2019, 03/11/2019  . PPD Test 02/10/2016, 02/26/2016  . Pneumococcal Conjugate-13 12/20/2014  . Pneumococcal Polysaccharide-23 02/07/2017  . Td 08/26/2014    Social History   Tobacco Use  . Smoking status: Former Smoker    Quit date: 12/17/1990    Years since quitting: 28.4  . Smokeless tobacco: Never Used  Substance Use Topics  . Alcohol use: No    Review of Systems  GENERAL:  no fevers, fatigue, appetite changes SKIN: No itching,  rash HEENT: No complaint RESPIRATORY: No cough, wheezing, SOB CARDIAC: No chest pain, palpitations, lower extremity edema  GI: No abdominal pain, No N/V/D or constipation, No heartburn or reflux  GU: No dysuria, frequency or urgency, or incontinence  MUSCULOSKELETAL: No unrelieved bone/joint pain NEUROLOGIC: No headache, dizziness  PSYCHIATRIC: No overt anxiety or sadness  Vitals:   06/01/19 1559  BP: 121/75  Pulse: 72  Resp: 19  Temp: (!) 97.2 F (36.2 C)   Body mass index is 21.61 kg/m. Physical Exam  GENERAL APPEARANCE: Alert, conversant, No acute distress, walking in the hall with her walker SKIN: No diaphoresis rash HEENT: Unremarkable RESPIRATORY: Breathing is even, unlabored. Lung sounds are clear   CARDIOVASCULAR: Heart RRR no murmurs, rubs or gallops. No peripheral edema  GASTROINTESTINAL: Abdomen is soft, non-tender, not distended w/ normal bowel sounds.  GENITOURINARY: Bladder non tender, not distended  MUSCULOSKELETAL: No abnormal joints or musculature NEUROLOGIC:  Cranial nerves 2-12 grossly intact. Moves all extremities PSYCHIATRIC: Mood and affect appropriate to situation with dementia, no behavioral issues  Patient Active Problem List   Diagnosis Date Noted  . Dry eye syndrome 03/06/2019  . Allergic rhinitis 02/07/2019  . MRSA bacteremia 09/21/2018  . Hospice care patient 09/17/2018  . Dehydration 08/30/2018  . Real time reverse transcriptase PCR positive for COVID-19 virus 08/30/2018  . Bilateral pneumonia 07/26/2018  . Rhonchi 07/26/2018  . Xerosis cutis 07/26/2018  . Restless leg syndrome 05/19/2018  . Chronic generalized pain 05/19/2018  . Hypotension 08/31/2016  . Postural dizziness with presyncope 08/31/2016  . Chest pain 08/31/2016  . Paroxysmal atrial fibrillation ([place]) 08/31/2016  . Hypokalemia 08/31/2016  . Depression 08/31/2016  . Vitamin D deficiency 08/31/2016  . Mood disorder ([place]) 08/22/2016  . Necrotizing pneumonia ([place]) 05/10/2016  . COPD exacerbation ([place]) 05/10/2016  . UTI (urinary tract infection) 05/10/2016  . Paroxysmal atrial fibrillation with RVR ([place]) 05/10/2016  . Metabolic encephalopathy 99/37/1696  . Elevated amylase and lipase 02/13/2016  . Acute encephalopathy 02/13/2016  . 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 Dementia with behavioral disturbance ([place]) 02/13/2016  . Overactive bladder 02/13/2016  . Chronic kidney disease   . COPD (chronic obstructive pulmonary disease) ([place])   . Anemia   . Anxiety   . Barrett's esophagus   . Hypothyroidism   . GERD (gastroesophageal reflux disease)   . Hypertension   . Memory difficulties   . Osteoporosis   . Psoriasis (a type of skin inflammation)     CMP     Component Value Date/Time   NA 139 04/27/2019 0000   NA 143 03/18/2017 0000   K 4.1 04/27/2019 0000   K 3.8 03/18/2017 0000   CL 104 04/27/2019 0000   CO2 26 (A) 04/27/2019 0000   GLUCOSE 115 (H) 01/26/2016 1935   BUN 18 04/27/2019 0000   CREATININE 0.7 04/27/2019 0000   CREATININE 0.64  03/18/2017 0000   CALCIUM 8.9 04/27/2019 0000   CALCIUM 9.2 03/18/2017 0000   ALBUMIN 3.6 04/27/2019 0000   ALBUMIN 3.8 03/18/2017 0000   AST 16 04/27/2019 0000   AST 9 03/18/2017 0000   ALT 11 04/27/2019 0000   ALT 7 03/18/2017 0000   ALKPHOS 95 04/27/2019 0000   ALKPHOS 99 03/18/2017 0000   BILITOT 0.2 03/18/2017 0000   GFRNONAA 74.04 03/12/2019 0000   GFRNONAA 85.41 03/18/2017 0000   GFRAA 85.81 03/12/2019 0000   Recent Labs    01/26/19 0000 03/12/19 0000 04/27/19 0000  NA 141 140 139  K  4.5 4.7 4.1  CL 107 106 104  CO2 25* 27* 26*  BUN 21 21 18   CREATININE 0.8 0.8 0.7  CALCIUM 9.3 8.9 8.9   Recent Labs    10/12/18 0000 01/26/19 0000 04/27/19 0000  AST 36* 14 16  ALT 29 9 11   ALKPHOS 104 78 95  ALBUMIN  --  3.7 3.6   Recent Labs    01/26/19 0000 03/12/19 0000 04/29/19 0000  WBC 6.8 5.6 9.9  NEUTROABS 2 5 7   HGB 11.3* 9.9* 11.3*  HCT 34* 29* 34*  PLT 267 212 306   No results for input(s): CHOL, LDLCALC, TRIG in the last 8760 hours.  Invalid input(s): HCL No results found for: [facility] Lab Results  Component Value Date   TSH 0.40 (A) 01/26/2019   Lab Results  Component Value Date   HGBA1C 5.4 12/22/2017   Lab Results  Component Value Date   CHOL 185 12/22/2017   HDL 34 (A) 12/22/2017   LDLCALC 110 12/22/2017   TRIG 203 (A) 12/22/2017    Significant Diagnostic Results in last 30 days:  No results found.  Assessment and Plan  Allergic rhinitis Chronic and stable; continue Flonase nasal spray 2 sprays each nostril daily  Dementia with behavioral disturbance Believe patient is at her new baseline since having Covid which is moderate advancement of her dementia but still very functional; continue Aricept 10 mg daily  Overactive bladder Continues without known infection; continue Ditropan 5 mg 3 times daily     13/12/2017, MD

## 2019-06-06 ENCOUNTER — Encounter: Payer: Self-pay | Admitting: Internal Medicine

## 2019-06-06 NOTE — Assessment & Plan Note (Signed)
Chronic and stable; continue Flonase nasal spray 2 sprays each nostril daily

## 2019-06-06 NOTE — Assessment & Plan Note (Signed)
Continues without known infection; continue Ditropan 5 mg 3 times daily

## 2019-06-06 NOTE — Assessment & Plan Note (Signed)
Believe patient is at her new baseline since having Covid which is moderate advancement of her dementia but still very functional; continue Aricept 10 mg daily

## 2019-09-13 LAB — COMPREHENSIVE METABOLIC PANEL
Albumin: 3.7 (ref 3.5–5.0)
Calcium: 8.8 (ref 8.7–10.7)
Globulin: 3.6

## 2019-09-13 LAB — BASIC METABOLIC PANEL
BUN: 21 (ref 4–21)
CO2: 25 — AB (ref 13–22)
Chloride: 108 (ref 99–108)
Creatinine: 0.8 (ref 0.5–1.1)
Glucose: 71
Potassium: 4.5 (ref 3.4–5.3)
Sodium: 141 (ref 137–147)

## 2019-09-13 LAB — HEPATIC FUNCTION PANEL
ALT: 12 (ref 7–35)
AST: 16 (ref 13–35)

## 2019-09-13 LAB — CBC AND DIFFERENTIAL
HCT: 34 — AB (ref 36–46)
Hemoglobin: 11.3 — AB (ref 12.0–16.0)
Platelets: 340 (ref 150–399)
WBC: 6.1

## 2019-09-13 LAB — CBC: RBC: 3.52 — AB (ref 3.87–5.11)

## 2019-09-13 LAB — TSH: TSH: 4.39 (ref 0.41–5.90)

## 2019-10-12 ENCOUNTER — Non-Acute Institutional Stay (SKILLED_NURSING_FACILITY): Payer: Medicare Other | Admitting: Internal Medicine

## 2019-10-12 ENCOUNTER — Encounter: Payer: Self-pay | Admitting: Internal Medicine

## 2019-10-12 DIAGNOSIS — G301 Alzheimer's disease with late onset: Secondary | ICD-10-CM

## 2019-10-12 DIAGNOSIS — K22719 Barrett's esophagus with dysplasia, unspecified: Secondary | ICD-10-CM

## 2019-10-12 DIAGNOSIS — F02818 Dementia in other diseases classified elsewhere, unspecified severity, with other behavioral disturbance: Secondary | ICD-10-CM

## 2019-10-12 DIAGNOSIS — N3281 Overactive bladder: Secondary | ICD-10-CM

## 2019-10-12 DIAGNOSIS — I480 Paroxysmal atrial fibrillation: Secondary | ICD-10-CM | POA: Diagnosis not present

## 2019-10-12 DIAGNOSIS — G2581 Restless legs syndrome: Secondary | ICD-10-CM

## 2019-10-12 DIAGNOSIS — J449 Chronic obstructive pulmonary disease, unspecified: Secondary | ICD-10-CM

## 2019-10-12 DIAGNOSIS — F0281 Dementia in other diseases classified elsewhere with behavioral disturbance: Secondary | ICD-10-CM

## 2019-10-12 DIAGNOSIS — E034 Atrophy of thyroid (acquired): Secondary | ICD-10-CM

## 2019-10-12 DIAGNOSIS — Z66 Do not resuscitate: Secondary | ICD-10-CM

## 2019-10-12 NOTE — Progress Notes (Signed)
Location:  [facility] Room Number: 213 W Place of Service:  SNF [phone]) Provider:  Cherrell Maybee L. Renato Gails, D.O., C.M.D.  Kermit Balo, DO  Patient Care Team: Kermit Balo, DO as PCP - General (Geriatric Medicine)  Extended Emergency Contact Information Primary Emergency Contact: Hopkins,Tammy Address: [address] [address] [name] Home Phone: [phone] Relation: Niece Secondary Emergency Contact: Mirian Capuchin Address: [address]          [place], [place] [address] [name] Home Phone: [phone] Relation: Niece  Code Status:  DNR Goals of care: Advanced Directive information Advanced Directives 10/12/2019  Does Patient Have a Medical Advance Directive? Yes  Type of Advance Directive Out of facility DNR (pink MOST or yellow form)  Does patient want to make changes to medical advance directive? No - Patient declined  Copy of Healthcare Power of Attorney in Chart? -  Pre-existing out of facility DNR order (yellow form or pink MOST form) -   Chief Complaint  Patient presents with  . Medical Management of Chronic Issues    Routine Visit     HPI:  Pt is a 79 y.o. female seen today for medical management of chronic diseases.  She lives at [facility] for long-term care under [facility].  She has a h/o dementia, COPD, paroxysmal afib, GERD/Barretts, hypothyroidism, osteoporosis, psoriasis, overactive bladder, vitamin D deficiency, CKD, restless legs, dry eyes, chronic generalized pain, anxiety, and depression.  She is eating and drinking well.    Per social work notes:  "Ms. Furia an 79 year old female has been receiving skilled care for almost four years. She has Dementia diagnosis. She is alert with some forgetfulness. She missed reporting the month/week day. She remembered sock and bed five minutes after repeating them. But needed cues was provided five minutes after she repeated blue, BIMS  11/15. On a daily basis, she attends/participates in out of room activities. She enjoys music, BINGO and dancing. PHQ-9, she stated, "I want to go home but Tammy (Niece) says that I can't go because nobody will be there to help me. Tammy's brother lives in my house and she said that if I go home, he will leave and noboby will be there with me. I been thinking about it these past weeks and it makes me depressed." Per niece, there is no discharge to the community plan. No behavior has been observed."  She is on aricept for dementia and remeron for sleep and depression as well as daily effexor which was dose-reduced.   She's recently been put on myrbetriq for bladder spasms but it was quickly discontinued presumably due to cost.    Her sister passed away in another facility earlier this month and she had a short course of ativan for her nerves.  She reported "she was the only one left now" to one of the staff.  She is on effexor for depression.   For COPD, she's on symbicort and albuterol prn.  She's had Barrett's esophagus and GERD with nightly pepcid to treat.    She is maintained on levothyroxine for her hypothyroidism.  Past Medical History:  Diagnosis Date  . Acute encephalopathy 02/13/2016  . Anemia   . Anxiety   . Arthritis   . Asthma   . Barrett's esophagus   . Chronic kidney disease   . COPD (chronic obstructive pulmonary disease) (HCC)   . Dementia with  behavioral disturbance (HCC) 02/13/2016  . Depression   . Disease of thyroid gland   . Diverticulitis   . GERD (gastroesophageal reflux disease)   . Hyperlipemia   . Hypertension   . Memory difficulties   . Osteoporosis   . Pancreatitis   . Paroxysmal atrial fibrillation with RVR (HCC) 05/10/2016  . Psoriasis (a type of skin inflammation)   . Vertigo   . Vitamin D deficiency 08/31/2016   Past Surgical History:  Procedure Laterality Date  . BACK SURGERY    . brain shun    . CHOLECYSTECTOMY    . ROTATOR CUFF REPAIR    .  SHOULDER SURGERY    . UPPER GI ENDOSCOPY  12/19/2015   [facility], Include esophagus, stomach & duodenum or / Jejunum: Dx w/wo biopsys and dilatation; Surgeon : Leonia ReevesAlbert John Rhoton MD    Allergies  Allergen Reactions  . Azithromycin Other (See Comments)    unknown  . Cephalexin Other (See Comments)    unknown  . Cephalosporins   . Ciprofloxacin Other (See Comments)    unknown  . Darvon [Propoxyphene]   . Flagyl [Metronidazole] Other (See Comments)    unknown  . Librax [Chlordiazepoxide-Clidinium] Other (See Comments)    unknown  . Macrodantin [Nitrofurantoin Macrocrystal]   . Nitrofuran Derivatives Other (See Comments)    unknown  . Other Other (See Comments)    unknown  . Oxycodone-Acetaminophen Other (See Comments)    unknown  . Percocet [Oxycodone-Acetaminophen]   . Quetiapine Other (See Comments)    unknown Potassium level dropped.  . Sulfa Antibiotics   . Sulfamethoxazole Other (See Comments)    unknown    Outpatient Encounter Medications as of 10/12/2019  Medication Sig  . acetaminophen (TYLENOL) 325 MG tablet Take 650 mg by mouth every 8 (eight) hours as needed.   Marland Kitchen. albuterol (VENTOLIN HFA) 108 (90 Base) MCG/ACT inhaler Inhale 2 puffs into the lungs every 6 (six) hours as needed for wheezing or shortness of breath.  Marland Kitchen. aspirin 81 MG chewable tablet Chew 81 mg by mouth daily.  . benzocaine (ORAJEL) 10 % mucosal gel Use as directed 1 application in the mouth or throat in the morning, at noon, in the evening, and at bedtime. Apply to left lower gum QID prn for pain  . bisacodyl (DULCOLAX) 10 MG suppository Place 10 mg rectally daily as needed for moderate constipation (Constipation not relieved by MOM).  . budesonide-formoterol (SYMBICORT) 160-4.5 MCG/ACT inhaler Inhale 2 puffs into the lungs 2 (two) times daily.  . cetirizine (ZYRTEC) 10 MG tablet Take 10 mg by mouth at bedtime.  . Cholecalciferol 1.25 MG (50000 UT) TABS Take by mouth.  . donepezil (ARICEPT) 10 MG tablet  Take 10 mg by mouth at bedtime.  . famotidine (PEPCID) 20 MG tablet Take 20 mg by mouth at bedtime.   . fluticasone (FLONASE) 50 MCG/ACT nasal spray Place 2 sprays into both nostrils daily as needed for allergies.   Marland Kitchen. guaiFENesin (ROBITUSSIN) 100 MG/5ML liquid Take 400 mg by mouth every 6 (six) hours as needed for cough.   Marland Kitchen. HYDROcodone-acetaminophen (NORCO/VICODIN) 5-325 MG tablet Take 1 tablet by mouth every 8 (eight) hours.  Marland Kitchen. levothyroxine (SYNTHROID, LEVOTHROID) 100 MCG tablet Take 100 mcg by mouth daily before breakfast.  . magnesium hydroxide (MILK OF MAGNESIA) 400 MG/5ML suspension Take 30 mLs by mouth daily as needed for mild constipation (If no BM in 3 days).  . Menthol, Topical Analgesic, (BIOFREEZE) 4 % GEL Apply topically.  .Marland Kitchen  mirtazapine (REMERON) 7.5 MG tablet Take 7.5 mg by mouth at bedtime. 1 tablet at bedtime for appetite stumulant  . potassium chloride SA (K-DUR) 20 MEQ tablet Take 20 mEq by mouth daily.  Marland Kitchen rOPINIRole (REQUIP) 0.5 MG tablet Take 0.5 mg by mouth 2 (two) times daily.  Marland Kitchen senna-docusate (SENOKOT-S) 8.6-50 MG tablet Take 1 tablet by mouth at bedtime.  Marland Kitchen venlafaxine (EFFEXOR) 37.5 MG tablet Take 37.5 mg by mouth daily.  . [DISCONTINUED] Carboxymeth-Glycerin-Polysorb (REFRESH OPTIVE ADVANCED OP) Apply to eye in the morning, at noon, in the evening, and at bedtime.  . [DISCONTINUED] cycloSPORINE (RESTASIS) 0.05 % ophthalmic emulsion Place 1 drop into both eyes 2 (two) times daily.  . [DISCONTINUED] mineral oil enema Place 1 enema rectally once. For constipation not relieved by Dulcolax suppository  . [DISCONTINUED] mirtazapine (REMERON) 15 MG tablet Take 7.5 mg by mouth at bedtime. 0.5 tablet to = 7.5  . [DISCONTINUED] Nutritional Supplements (ENSURE ENLIVE PO) Take 237 mLs by mouth 2 (two) times daily.  . [DISCONTINUED] oxybutynin (DITROPAN) 5 MG tablet Take 5 mg by mouth 2 (two) times daily.   . [DISCONTINUED] venlafaxine XR (EFFEXOR-XR) 75 MG 24 hr capsule take 1 tab  po daily for depression   No facility-administered encounter medications on file as of 10/12/2019.    Review of Systems  Constitutional: Negative for chills and fever.  HENT: Negative for congestion and sore throat.   Eyes:       Dry eyes  Respiratory: Negative for cough and shortness of breath.   Cardiovascular: Negative for chest pain, palpitations and leg swelling.  Gastrointestinal: Positive for constipation and heartburn. Negative for abdominal pain, blood in stool and melena.  Genitourinary: Positive for frequency and urgency. Negative for dysuria.       OAB  Musculoskeletal: Positive for joint pain.  Skin:       psoriasis  Neurological: Negative for dizziness and loss of consciousness.  Psychiatric/Behavioral: Positive for depression and memory loss. The patient is nervous/anxious.     Immunization History  Administered Date(s) Administered  . Influenza, High Dose Seasonal PF 12/20/2014  . Influenza, Seasonal, Injecte, Preservative Fre 10/10/2014  . Influenza-Unspecified 11/21/2016, 11/19/2017, 11/14/2018  . Moderna SARS-COVID-2 Vaccination 02/11/2019, 03/11/2019  . PPD Test 02/10/2016, 02/26/2016  . Pneumococcal Conjugate-13 12/20/2014  . Pneumococcal Polysaccharide-23 02/07/2017  . Td 08/26/2014   Pertinent  Health Maintenance Due  Topic Date Due  . INFLUENZA VACCINE  09/12/2019  . DEXA SCAN  Completed  . PNA vac Low Risk Adult  Completed   Fall Risk  07/31/2017 07/31/2016  Falls in the past year? No Yes  Number falls in past yr: - 2 or more  Injury with Fall? - No   Functional Status Survey:    Vitals:   10/12/19 1017  BP: 118/70  Pulse: 81  Temp: (!) 97.3 F (36.3 C)  Weight: 122 lb (55.3 kg)  Height: 5\' 3"  (1.6 m)   Body mass index is 21.61 kg/m. Physical Exam Vitals reviewed.  Constitutional:      General: She is not in acute distress.    Appearance: She is not toxic-appearing.  HENT:     Head: Normocephalic and atraumatic.  Cardiovascular:       Rate and Rhythm: Normal rate and regular rhythm.  Pulmonary:     Effort: Pulmonary effort is normal.     Breath sounds: Wheezing present.  Abdominal:     General: Bowel sounds are normal.     Palpations: Abdomen is soft.  Tenderness: There is no abdominal tenderness.  Musculoskeletal:        General: Normal range of motion.     Right lower leg: No edema.     Left lower leg: No edema.  Skin:    General: Skin is warm and dry.  Neurological:     General: No focal deficit present.     Mental Status: She is alert. Mental status is at baseline.  Psychiatric:     Comments: Admits to sadness     Labs reviewed: Recent Labs    01/26/19 0000 03/12/19 0000 04/27/19 0000  NA 141 140 139  K 4.5 4.7 4.1  CL 107 106 104  CO2 25* 27* 26*  BUN 21 21 18   CREATININE 0.8 0.8 0.7  CALCIUM 9.3 8.9 8.9   Recent Labs    01/26/19 0000 04/27/19 0000  AST 14 16  ALT 9 11  ALKPHOS 78 95  ALBUMIN 3.7 3.6   Recent Labs    01/26/19 0000 03/12/19 0000 04/29/19 0000  WBC 6.8 5.6 9.9  NEUTROABS 2 5 7   HGB 11.3* 9.9* 11.3*  HCT 34* 29* 34*  PLT 267 212 306   Lab Results  Component Value Date   TSH 0.40 (A) 01/26/2019   Lab Results  Component Value Date   HGBA1C 5.4 12/22/2017   Lab Results  Component Value Date   CHOL 185 12/22/2017   HDL 34 (A) 12/22/2017   LDLCALC 110 12/22/2017   TRIG 203 (A) 12/22/2017     Assessment/Plan 1. Late onset Alzheimer's disease with behavioral disturbance (HCC) -moderate, remains active in the facility and verbal -does need ADL assistance in snf level of care -continues on aricept alone  2. Overactive bladder -cost of myrbetriq was prohibitive, she is not on meds for this at this time and would avoid alternatives that will counteract her aricept  3. Paroxysmal atrial fibrillation (HCC) -not on anticoagulation and does not appear to be afib at present  4. Barrett's esophagus with dysplasia -remains on pepcid alone, no endoscopy  in [facility] chart so not clear how long ago this was  5. Hypothyroidism due to acquired atrophy of thyroid Lab Results  Component Value Date   TSH 4.39 09/13/2019  -cont levothyroxine 13/12/2017 before breakfast  6. Chronic obstructive pulmonary disease, unspecified COPD type (HCC) -stable w/o noted recent exacerbations, continue routine symbicort and prn albuterol as long as she's able to use the devices  7. Restless leg syndrome -maintained on low dose of requip bid--cont and monitor  8. DNR (do not resuscitate) - Do not attempt resuscitation (DNR) order confirmed and added to epic for continuity of care  Family/ staff Communication: d/w snf nurse and notes reviewed  Labs/tests ordered: no new added--[facility] NP orders regular labs  Jetty Berland L. Shikara Mcauliffe, D.O. Geriatrics 11/13/2019 [facility] [address], [address] [place] Cell Phone (Mon-Fri 8am-5pm):  [phone] On Call:  [phone] & follow prompts after 5pm & weekends Office Phone:  [phone] Office Fax:  [phone]

## 2019-12-29 LAB — CBC AND DIFFERENTIAL
HCT: 37 (ref 36–46)
Hemoglobin: 12.2 (ref 12.0–16.0)
Platelets: 293 (ref 150–399)
WBC: 7.4

## 2019-12-29 LAB — BASIC METABOLIC PANEL
BUN: 18 (ref 4–21)
CO2: 25 — AB (ref 13–22)
Chloride: 105 (ref 99–108)
Creatinine: 0.9 (ref 0.5–1.1)
Glucose: 90
Potassium: 4.3 (ref 3.4–5.3)
Sodium: 138 (ref 137–147)

## 2019-12-29 LAB — TSH: TSH: 3.4 (ref 0.41–5.90)

## 2019-12-29 LAB — COMPREHENSIVE METABOLIC PANEL: Calcium: 9.2 (ref 8.7–10.7)

## 2019-12-29 LAB — CBC: RBC: 3.88 (ref 3.87–5.11)

## 2019-12-31 ENCOUNTER — Encounter: Payer: Self-pay | Admitting: Internal Medicine

## 2019-12-31 ENCOUNTER — Non-Acute Institutional Stay (SKILLED_NURSING_FACILITY): Payer: Medicare Other | Admitting: Internal Medicine

## 2019-12-31 DIAGNOSIS — E034 Atrophy of thyroid (acquired): Secondary | ICD-10-CM | POA: Diagnosis not present

## 2019-12-31 DIAGNOSIS — G2581 Restless legs syndrome: Secondary | ICD-10-CM

## 2019-12-31 DIAGNOSIS — R059 Cough, unspecified: Secondary | ICD-10-CM

## 2019-12-31 DIAGNOSIS — G301 Alzheimer's disease with late onset: Secondary | ICD-10-CM

## 2019-12-31 DIAGNOSIS — F02818 Dementia in other diseases classified elsewhere, unspecified severity, with other behavioral disturbance: Secondary | ICD-10-CM

## 2019-12-31 DIAGNOSIS — E559 Vitamin D deficiency, unspecified: Secondary | ICD-10-CM

## 2019-12-31 DIAGNOSIS — N3281 Overactive bladder: Secondary | ICD-10-CM

## 2019-12-31 DIAGNOSIS — F0281 Dementia in other diseases classified elsewhere with behavioral disturbance: Secondary | ICD-10-CM

## 2019-12-31 DIAGNOSIS — J449 Chronic obstructive pulmonary disease, unspecified: Secondary | ICD-10-CM | POA: Diagnosis not present

## 2019-12-31 NOTE — Progress Notes (Signed)
Location:  [facility] Room Number: 213 Place of Service:  SNF [phone]) Provider:  Amado Andal L. Renato Gails, D.O., C.M.D.  Kermit Balo, DO  Patient Care Team: Kermit Balo, DO as PCP - General (Geriatric Medicine)  Extended Emergency Contact Information Primary Emergency Contact: Hopkins,Tammy Address: [address] [address] [name] Home Phone: [phone] Relation: Niece Secondary Emergency Contact: Mirian Capuchin Address: [address]          [place], [place] [address] [name] Home Phone: [phone] Relation: Niece  Code Status:  DNR Goals of care: Advanced Directive information Advanced Directives 12/31/2019  Does Patient Have a Medical Advance Directive? Yes  Type of Advance Directive Out of facility DNR (pink MOST or yellow form)  Does patient want to make changes to medical advance directive? No - Patient declined  Copy of Healthcare Power of Attorney in Chart? -  Pre-existing out of facility DNR order (yellow form or pink MOST form) Pink MOST/Yellow Form most recent copy in chart - Physician notified to receive inpatient order   Chief Complaint  Patient presents with  . Medical Management of Chronic Issues    Routine Visit     HPI:  Pt is a 79 y.o. female seen today for medical management of chronic diseases.  She is here for long-term care under [facility].  She has a h/o AD, copd, afib, GERD, hypothyroid, osteoporosis, psoriasis, OAB, D deficiency, CKD, RLS, dry eyes, anxiety and depression among others.    She reports she's had a cough for a few days that is not responding to current treatment.  Denies sore throat, has not had fever, chills.  Does report myalgias, but had moderna covid booster on 11/18.  covid testing has been negative. She has a good appetite.  She requires assistance with adls and transfers but is able to make her needs known to staff.  Funny enough, reviewing nursing  notes, she's not even reported this cough to them and she did not cough during the visit.   Past Medical History:  Diagnosis Date  . Acute encephalopathy 02/13/2016  . Anemia   . Anxiety   . Arthritis   . Asthma   . Barrett's esophagus   . Chronic kidney disease   . COPD (chronic obstructive pulmonary disease) (HCC)   . Dementia with behavioral disturbance (HCC) 02/13/2016  . Depression   . Disease of thyroid gland   . Diverticulitis   . GERD (gastroesophageal reflux disease)   . Hyperlipemia   . Hypertension   . Memory difficulties   . Osteoporosis   . Pancreatitis   . Paroxysmal atrial fibrillation with RVR (HCC) 05/10/2016  . Psoriasis (a type of skin inflammation)   . Vertigo   . Vitamin D deficiency 08/31/2016   Past Surgical History:  Procedure Laterality Date  . BACK SURGERY    . brain shun    . CHOLECYSTECTOMY    . ROTATOR CUFF REPAIR    . SHOULDER SURGERY    . UPPER GI ENDOSCOPY  12/19/2015   [facility], Include esophagus, stomach & duodenum or / Jejunum: Dx w/wo biopsys and dilatation; Surgeon : Leonia Reeves Rhoton MD    Allergies  Allergen Reactions  . Azithromycin Other (See Comments)    unknown  . Cephalexin Other (See Comments)    unknown  . Cephalosporins   . Ciprofloxacin Other (See Comments)    unknown  .  Darvon [Propoxyphene]   . Flagyl [Metronidazole] Other (See Comments)    unknown  . Librax [Chlordiazepoxide-Clidinium] Other (See Comments)    unknown  . Macrodantin [Nitrofurantoin Macrocrystal]   . Nitrofuran Derivatives Other (See Comments)    unknown  . Other Other (See Comments)    unknown  . Oxycodone-Acetaminophen Other (See Comments)    unknown  . Percocet [Oxycodone-Acetaminophen]   . Quetiapine Other (See Comments)    unknown Potassium level dropped.  . Sulfa Antibiotics   . Sulfamethoxazole Other (See Comments)    unknown    Outpatient Encounter Medications as of 12/31/2019  Medication Sig  . acetaminophen (TYLENOL) 325 MG  tablet Take 650 mg by mouth every 8 (eight) hours as needed.   Marland Kitchen. albuterol (VENTOLIN HFA) 108 (90 Base) MCG/ACT inhaler Inhale 2 puffs into the lungs every 6 (six) hours as needed for wheezing or shortness of breath.  Marland Kitchen. aspirin 81 MG chewable tablet Chew 81 mg by mouth daily.  . benzocaine (ORAJEL) 10 % mucosal gel Use as directed 1 application in the mouth or throat in the morning, at noon, in the evening, and at bedtime. Apply to left lower gum QID prn for pain  . bisacodyl (DULCOLAX) 10 MG suppository Place 10 mg rectally daily as needed for moderate constipation (Constipation not relieved by MOM).  . budesonide-formoterol (SYMBICORT) 160-4.5 MCG/ACT inhaler Inhale 2 puffs into the lungs 2 (two) times daily.  . cetirizine (ZYRTEC) 10 MG tablet Take 10 mg by mouth at bedtime.  . Cholecalciferol 1.25 MG (50000 UT) TABS Take by mouth.  . donepezil (ARICEPT) 10 MG tablet Take 10 mg by mouth at bedtime.  . famotidine (PEPCID) 20 MG tablet Take 20 mg by mouth at bedtime.   . fluticasone (FLONASE) 50 MCG/ACT nasal spray Place 2 sprays into both nostrils daily as needed for allergies.   Marland Kitchen. guaiFENesin (ROBITUSSIN) 100 MG/5ML liquid Take 400 mg by mouth every 6 (six) hours as needed for cough.   Marland Kitchen. HYDROcodone-acetaminophen (NORCO/VICODIN) 5-325 MG tablet Take 1 tablet by mouth every 8 (eight) hours.  Marland Kitchen. levothyroxine (SYNTHROID, LEVOTHROID) 100 MCG tablet Take 100 mcg by mouth daily before breakfast.  . magnesium hydroxide (MILK OF MAGNESIA) 400 MG/5ML suspension Take 30 mLs by mouth daily as needed for mild constipation (If no BM in 3 days).  . Menthol, Topical Analgesic, (BIOFREEZE) 4 % GEL Apply topically.  . mirabegron ER (MYRBETRIQ) 25 MG TB24 tablet Take 25 mg by mouth daily.  . potassium chloride SA (K-DUR) 20 MEQ tablet Take 20 mEq by mouth daily.  Marland Kitchen. rOPINIRole (REQUIP) 0.5 MG tablet Take 0.5 mg by mouth 2 (two) times daily.  Marland Kitchen. senna-docusate (SENOKOT-S) 8.6-50 MG tablet Take 1 tablet by mouth at  bedtime.  Marland Kitchen. venlafaxine (EFFEXOR) 37.5 MG tablet Take 37.5 mg by mouth daily.  . [DISCONTINUED] mirtazapine (REMERON) 7.5 MG tablet Take 7.5 mg by mouth at bedtime. 1 tablet at bedtime for appetite stumulant   No facility-administered encounter medications on file as of 12/31/2019.    Review of Systems  Constitutional: Negative for chills, fever and malaise/fatigue.  HENT: Negative for congestion and sore throat.   Eyes: Negative for blurred vision.  Respiratory: Positive for cough. Negative for sputum production and shortness of breath.   Cardiovascular: Negative for chest pain, palpitations and leg swelling.  Gastrointestinal: Negative for abdominal pain, blood in stool, constipation and melena.  Genitourinary: Negative for dysuria.  Musculoskeletal: Positive for myalgias. Negative for falls and joint pain.  Skin:  Negative for rash.  Neurological: Negative for dizziness and loss of consciousness.  Psychiatric/Behavioral: Positive for depression and memory loss. The patient is nervous/anxious.     Immunization History  Administered Date(s) Administered  . Influenza, High Dose Seasonal PF 12/20/2014  . Influenza, Seasonal, Injecte, Preservative Fre 10/10/2014  . Influenza-Unspecified 11/21/2016, 11/19/2017, 11/14/2018  . Moderna SARS-COVID-2 Vaccination 02/11/2019, 03/11/2019  . PPD Test 02/10/2016, 02/26/2016  . Pneumococcal Conjugate-13 12/20/2014  . Pneumococcal Polysaccharide-23 02/07/2017  . Td 08/26/2014   Pertinent  Health Maintenance Due  Topic Date Due  . INFLUENZA VACCINE  09/12/2019  . DEXA SCAN  Completed  . PNA vac Low Risk Adult  Completed   Fall Risk  07/31/2017 07/31/2016  Falls in the past year? No Yes  Number falls in past yr: - 2 or more  Injury with Fall? - No   Functional Status Survey:    Vitals:   12/31/19 1128  BP: 114/67  Pulse: 74  Temp: 97.7 F (36.5 C)  Weight: 122 lb (55.3 kg)  Height: 5\' 3"  (1.6 m)   Body mass index is 21.61  kg/m. Physical Exam Vitals reviewed.  Constitutional:      General: She is not in acute distress.    Appearance: Normal appearance. She is not ill-appearing or toxic-appearing.  HENT:     Head: Normocephalic and atraumatic.     Right Ear: External ear normal.     Left Ear: External ear normal.     Nose: Nose normal.     Mouth/Throat:     Pharynx: Oropharynx is clear.  Eyes:     Extraocular Movements: Extraocular movements intact.     Conjunctiva/sclera: Conjunctivae normal.     Pupils: Pupils are equal, round, and reactive to light.  Cardiovascular:     Rate and Rhythm: Rhythm irregular.     Heart sounds: No murmur heard.  No friction rub. No gallop.   Pulmonary:     Effort: Pulmonary effort is normal.     Breath sounds: Normal breath sounds. No wheezing, rhonchi or rales.  Abdominal:     General: Bowel sounds are normal. There is no distension.     Palpations: Abdomen is soft.     Tenderness: There is no abdominal tenderness. There is no guarding or rebound.  Musculoskeletal:        General: Normal range of motion.     Right lower leg: No edema.     Left lower leg: No edema.  Neurological:     General: No focal deficit present.     Mental Status: She is alert. Mental status is at baseline.     Motor: Weakness present.     Gait: Gait abnormal.  Psychiatric:        Mood and Affect: Mood normal.     Labs reviewed: Recent Labs    04/27/19 0000 09/13/19 0000 12/29/19 0000  NA 139 141 138  K 4.1 4.5 4.3  CL 104 108 105  CO2 26* 25* 25*  BUN 18 21 18   CREATININE 0.7 0.8 0.9  CALCIUM 8.9 8.8 9.2   Recent Labs    01/26/19 0000 04/27/19 0000 09/13/19 0000  AST 14 16 16   ALT 9 11 12   ALKPHOS 78 95  --   ALBUMIN 3.7 3.6 3.7   Recent Labs    01/26/19 0000 01/26/19 0000 03/12/19 0000 03/12/19 0000 04/29/19 0000 09/13/19 0000 12/29/19 0000  WBC 6.8   < > 5.6   < > 9.9 6.1 7.4  NEUTROABS 2  --  5  --  7  --   --   HGB 11.3*   < > 9.9*   < > 11.3* 11.3*  12.2  HCT 34*   < > 29*   < > 34* 34* 37  PLT 267   < > 212   < > 306 340 293   < > = values in this interval not displayed.   Lab Results  Component Value Date   TSH 3.40 12/29/2019   Lab Results  Component Value Date   HGBA1C 5.4 12/22/2017   Lab Results  Component Value Date   CHOL 185 12/22/2017   HDL 34 (A) 12/22/2017   LDLCALC 110 12/22/2017   TRIG 203 (A) 12/22/2017    Significant Diagnostic Results in last 30 days:  No results found.  Assessment/Plan 1. Late onset Alzheimer's disease with behavioral disturbance (HCC) -moderate stage -able to feed herself and participate some in ADLs with help -able to converse and recognizes staff and makes needs known -on aricept  2. Overactive bladder -improved with myrbetriq therapy  3. Chronic obstructive pulmonary disease, unspecified COPD type (HCC) -cont symbicort, prn albuterol, prn robitussin for cough  4. Hypothyroidism due to acquired atrophy of thyroid -cont levothyroxine therapy -just had normal tsh two days ago  5. Restless leg syndrome -improved with low dose requip bid  6. Vitamin D deficiency -cont monthly D2 supplement Last vitamin D Lab Results  Component Value Date   VD25OH 35.19 04/07/2019   7.  Cough -suspect simply due to her copd -cont albuterol and cough syrup -monitor for other symptoms -has had covid screening that was negative  Family/ staff Communication: d/w snf nurse, reviewed nursing and [facility] NP notes  Labs/tests ordered:  No new added, just had full panel  Ranyah Groeneveld L. Carly Sabo, D.O. Geriatrics [facility] [address] [place], [place] [address] Cell Phone (Mon-Fri 8am-5pm):  [phone] On Call:  [phone] & follow prompts after 5pm & weekends Office Phone:  [phone] Office Fax:  [phone]

## 2020-04-03 ENCOUNTER — Encounter: Payer: Self-pay | Admitting: Internal Medicine

## 2021-07-21 ENCOUNTER — Emergency Department (HOSPITAL_COMMUNITY): Payer: Medicare Other

## 2021-07-21 ENCOUNTER — Emergency Department (HOSPITAL_COMMUNITY)
Admission: EM | Admit: 2021-07-21 | Discharge: 2021-07-21 | Disposition: A | Discharge status: Skilled Nursing Facility | Payer: Medicare Other | Attending: Emergency Medicine | Admitting: Emergency Medicine

## 2021-07-21 ENCOUNTER — Other Ambulatory Visit: Payer: Self-pay

## 2021-07-21 ENCOUNTER — Encounter (HOSPITAL_COMMUNITY): Payer: Self-pay

## 2021-07-21 DIAGNOSIS — I129 Hypertensive chronic kidney disease with stage 1 through stage 4 chronic kidney disease, or unspecified chronic kidney disease: Secondary | ICD-10-CM | POA: Insufficient documentation

## 2021-07-21 DIAGNOSIS — N189 Chronic kidney disease, unspecified: Secondary | ICD-10-CM | POA: Insufficient documentation

## 2021-07-21 DIAGNOSIS — W01198A Fall on same level from slipping, tripping and stumbling with subsequent striking against other object, initial encounter: Secondary | ICD-10-CM | POA: Diagnosis not present

## 2021-07-21 DIAGNOSIS — Z7982 Long term (current) use of aspirin: Secondary | ICD-10-CM | POA: Insufficient documentation

## 2021-07-21 DIAGNOSIS — J449 Chronic obstructive pulmonary disease, unspecified: Secondary | ICD-10-CM | POA: Diagnosis not present

## 2021-07-21 DIAGNOSIS — S0101XA Laceration without foreign body of scalp, initial encounter: Secondary | ICD-10-CM | POA: Insufficient documentation

## 2021-07-21 DIAGNOSIS — W19XXXA Unspecified fall, initial encounter: Secondary | ICD-10-CM

## 2021-07-21 DIAGNOSIS — Z79899 Other long term (current) drug therapy: Secondary | ICD-10-CM | POA: Insufficient documentation

## 2021-07-21 DIAGNOSIS — E039 Hypothyroidism, unspecified: Secondary | ICD-10-CM | POA: Insufficient documentation

## 2021-07-21 DIAGNOSIS — I4891 Unspecified atrial fibrillation: Secondary | ICD-10-CM | POA: Diagnosis not present

## 2021-07-21 DIAGNOSIS — R109 Unspecified abdominal pain: Secondary | ICD-10-CM | POA: Insufficient documentation

## 2021-07-21 DIAGNOSIS — S0990XA Unspecified injury of head, initial encounter: Secondary | ICD-10-CM

## 2021-07-21 DIAGNOSIS — F0390 Unspecified dementia without behavioral disturbance: Secondary | ICD-10-CM | POA: Insufficient documentation

## 2021-07-21 DIAGNOSIS — Z23 Encounter for immunization: Secondary | ICD-10-CM | POA: Diagnosis not present

## 2021-07-21 LAB — URINALYSIS, ROUTINE W REFLEX MICROSCOPIC
Bilirubin Urine: NEGATIVE
Glucose, UA: NEGATIVE mg/dL
Hgb urine dipstick: NEGATIVE
Ketones, ur: NEGATIVE mg/dL
Nitrite: POSITIVE — AB
Protein, ur: NEGATIVE mg/dL
Specific Gravity, Urine: >1.046 — ABNORMAL HIGH (ref 1.005–1.030)
pH: 5 (ref 5.0–8.0)

## 2021-07-21 LAB — COMPREHENSIVE METABOLIC PANEL
ALT: 16 U/L (ref 0–44)
AST: 17 U/L (ref 15–41)
Albumin: 3.5 g/dL (ref 3.5–5.0)
Alkaline Phosphatase: 85 U/L (ref 38–126)
Anion gap: 6 (ref 5–15)
BUN: 19 mg/dL (ref 8–23)
CO2: 26 mmol/L (ref 22–32)
Calcium: 9.1 mg/dL (ref 8.9–10.3)
Chloride: 109 mmol/L (ref 98–111)
Creatinine, Ser: 0.94 mg/dL (ref 0.44–1.00)
GFR, Estimated: >60 mL/min (ref >60)
Glucose, Bld: 135 mg/dL — ABNORMAL HIGH (ref 70–99)
Potassium: 4.2 mmol/L (ref 3.5–5.1)
Sodium: 141 mmol/L (ref 135–145)
Total Bilirubin: 0.6 mg/dL (ref 0.3–1.2)
Total Protein: 7.9 g/dL (ref 6.5–8.1)

## 2021-07-21 LAB — CBC
HCT: 34.9 % — ABNORMAL LOW (ref 36.0–46.0)
Hemoglobin: 11.3 g/dL — ABNORMAL LOW (ref 12.0–15.0)
MCH: 32.6 pg (ref 26.0–34.0)
MCHC: 32.4 g/dL (ref 30.0–36.0)
MCV: 100.6 fL — ABNORMAL HIGH (ref 80.0–100.0)
Platelets: 247 10*3/uL (ref 150–400)
RBC: 3.47 MIL/uL — ABNORMAL LOW (ref 3.87–5.11)
RDW: 13.3 % (ref 11.5–15.5)
WBC: 8.2 10*3/uL (ref 4.0–10.5)
nRBC: 0 % (ref 0.0–0.2)

## 2021-07-21 LAB — I-STAT CHEM 8, ED
BUN: 18 mg/dL (ref 8–23)
Calcium, Ion: 1.19 mmol/L (ref 1.15–1.40)
Chloride: 105 mmol/L (ref 98–111)
Creatinine, Ser: 0.9 mg/dL (ref 0.44–1.00)
Glucose, Bld: 132 mg/dL — ABNORMAL HIGH (ref 70–99)
HCT: 35 % — ABNORMAL LOW (ref 36.0–46.0)
Hemoglobin: 11.9 g/dL — ABNORMAL LOW (ref 12.0–15.0)
Potassium: 4.2 mmol/L (ref 3.5–5.1)
Sodium: 141 mmol/L (ref 135–145)
TCO2: 25 mmol/L (ref 22–32)

## 2021-07-21 LAB — PROTIME-INR
INR: 1 (ref 0.8–1.2)
Prothrombin Time: 13.1 seconds (ref 11.4–15.2)

## 2021-07-21 LAB — ETHANOL: Alcohol, Ethyl (B): <10 mg/dL (ref <10)

## 2021-07-21 LAB — LACTIC ACID, PLASMA: Lactic Acid, Venous: 0.8 mmol/L (ref 0.5–1.9)

## 2021-07-21 MED ORDER — LIDOCAINE-EPINEPHRINE (PF) 2 %-1:200000 IJ SOLN
20.0000 mL | Freq: Once | INTRAMUSCULAR | Status: DC
  Filled 2021-07-21: qty 20

## 2021-07-21 MED ORDER — IOHEXOL 300 MG/ML  SOLN
100.0000 mL | Freq: Once | INTRAMUSCULAR | Status: AC | PRN
  Administered 2021-07-21: 100 mL via INTRAVENOUS

## 2021-07-21 MED ORDER — HYDROCODONE-ACETAMINOPHEN 5-325 MG PO TABS
1.0000 | ORAL_TABLET | Freq: Once | ORAL | Status: AC
  Administered 2021-07-21: 1 via ORAL
  Filled 2021-07-21: qty 1

## 2021-07-21 MED ORDER — TETANUS-DIPHTH-ACELL PERTUSSIS 5-2.5-18.5 LF-MCG/0.5 IM SUSY
0.5000 mL | PREFILLED_SYRINGE | Freq: Once | INTRAMUSCULAR | Status: AC
  Administered 2021-07-21: 0.5 mL via INTRAMUSCULAR
  Filled 2021-07-21: qty 0.5

## 2021-07-21 NOTE — ED Notes (Signed)
Pt transported back to [facility] via [facility].  No belongings left in room upon dc.

## 2021-07-21 NOTE — ED Provider Notes (Signed)
[facility] DEPT Provider Note   CSN: [id number] Arrival date & time: 07/21/21  1210     History PMH: COPD, CKD, hypothyroidism, GERD, osteoporosis, Dementia, HLD, atrial fibrillation, HTN Chief Complaint  Patient presents with  . Fall  . Head Laceration    Rachel Dickerson is a 81 y.o. female. Patient presents the ED with a chief complaint of a fall.  She presents from a nursing facility.  She says that she was standing and lost her balance and she fell backwards in her head.  She denies any loss of consciousness.  She does have some posterior neck pain as well as bilateral shoulder pain and rib cage pain.  She is not on any blood thinners.  She has not Ambulated since the fall. Per EMS that she has a 1/2 inch laceration to the back of her head.  She is at her baseline mentation.  She presents with DNR paperwork.    Fall Associated symptoms include headaches.  Head Laceration Associated symptoms include headaches.       Home Medications Prior to Admission medications   Medication Sig Start Date End Date Taking? Authorizing Provider  acetaminophen (TYLENOL) 325 MG tablet Take 650 mg by mouth every 8 (eight) hours as needed.    Yes [provider]  albuterol (VENTOLIN HFA) 108 (90 Base) MCG/ACT inhaler Inhale 2 puffs into the lungs every 6 (six) hours as needed for wheezing or shortness of breath.   Yes [provider]  aspirin 81 MG chewable tablet Chew 81 mg by mouth daily.   Yes [provider]  benzocaine (ORAJEL) 10 % mucosal gel Use as directed 1 application in the mouth or throat in the morning, at noon, in the evening, and at bedtime. Apply to left lower gum QID prn for pain   Yes [provider]  budesonide-formoterol (SYMBICORT) 160-4.5 MCG/ACT inhaler Inhale 2 puffs into the lungs 2 (two) times daily.   Yes [provider]  Cholecalciferol 1.25 MG (50000 UT) TABS Take 1 tablet by mouth every  30 (thirty) days. On the 1st of the month   Yes [provider]  donepezil (ARICEPT) 10 MG tablet Take 10 mg by mouth at bedtime.   Yes [provider]  famotidine (PEPCID) 20 MG tablet Take 20 mg by mouth at bedtime.    Yes [provider]  fluticasone (FLONASE) 50 MCG/ACT nasal spray Place 2 sprays into both nostrils daily as needed for allergies.    Yes [provider]  guaiFENesin (ROBITUSSIN) 100 MG/5ML liquid Take 400 mg by mouth every 6 (six) hours as needed for cough.    Yes [provider]  HYDROcodone-acetaminophen (NORCO/VICODIN) 5-325 MG tablet Take 1 tablet by mouth every 8 (eight) hours. 04/05/19  Yes Hennie Duos, MD  levothyroxine (SYNTHROID) 137 MCG tablet Take 137 mcg by mouth daily. 08/28/16  Yes [provider]  levothyroxine (SYNTHROID, LEVOTHROID) 100 MCG tablet Take 100 mcg by mouth daily before breakfast.   Yes [provider]  loratadine (CLARITIN) 10 MG tablet Take 10 mg by mouth daily.   Yes [provider]  magnesium hydroxide (MILK OF MAGNESIA) 400 MG/5ML suspension Take 30 mLs by mouth daily as needed for mild constipation (If no BM in 3 days).   Yes [provider]  mirabegron ER (MYRBETRIQ) 25 MG TB24 tablet Take 25 mg by mouth daily.   Yes [provider]  potassium chloride SA (K-DUR) 20 MEQ tablet Take  20 mEq by mouth daily.   Yes [provider]  rOPINIRole (REQUIP) 0.5 MG tablet Take 0.25 mg by mouth 2 (two) times daily.   Yes [provider]  senna-docusate (SENOKOT-S) 8.6-50 MG tablet Take 1 tablet by mouth at bedtime.   Yes [provider]  venlafaxine (EFFEXOR) 37.5 MG tablet Take 37.5 mg by mouth daily.   Yes [provider]  Menthol, Topical Analgesic, (BIOFREEZE) 4 % GEL Apply 1 Application topically in the morning, at noon, and at bedtime. Apply to both knees    [provider]      Allergies    Azithromycin,  Cephalexin, Cephalosporins, Ciprofloxacin, Darvon [propoxyphene], Flagyl [metronidazole], Librax [chlordiazepoxide-clidinium], Macrodantin [nitrofurantoin macrocrystal], Nitrofuran derivatives, Other, Oxycodone-acetaminophen, Percocet [oxycodone-acetaminophen], Quetiapine, Sulfa antibiotics, and Sulfamethoxazole    Review of Systems   Review of Systems  Musculoskeletal:  Positive for arthralgias, back pain and neck pain.  Neurological:  Positive for headaches.  All other systems reviewed and are negative.   Physical Exam Updated Vital Signs BP (!) 118/94   Pulse 91   Temp 97.9 F (36.6 C) (Oral)   Resp 14   Ht 5\' 5"  (1.651 m)   Wt 55 kg   LMP  (LMP Unknown)   SpO2 95%   BMI 20.18 kg/m  Physical Exam Vitals and nursing note reviewed.  Constitutional:      General: She is not in acute distress.    Appearance: Normal appearance. She is not ill-appearing, toxic-appearing or diaphoretic.  HENT:     Head: Normocephalic and atraumatic.     Comments: Dried blood noted in the posterior occipital region.  Difficult to visualize laceration with c-collar in place.  No active bleeding present.  No hematoma.  Slight tenderness to palpation.   Face is without swelling, ttp, or deformities. Mandible is with no trismus or tenderness.    Right Ear: Tympanic membrane, ear canal and external ear normal. There is no impacted cerumen.     Left Ear: Tympanic membrane, ear canal and external ear normal. There is no impacted cerumen.     Ears:     Comments: No battles sign, hemotympanum, or ear drainage bilaterally.    Nose: Nose normal. No congestion or rhinorrhea.     Comments: No nasal deformity or evidence of nasal septal hematoma    Mouth/Throat:     Mouth: Mucous membranes are moist.     Pharynx: Oropharynx is clear. Uvula midline. No pharyngeal swelling, oropharyngeal exudate or posterior oropharyngeal erythema.     Comments: No evidence of intraoral lesions, dental fracture, or tongue  laceration. No posterior pharyngeal swelling. Eyes:     General: No scleral icterus.       Right eye: No discharge.        Left eye: No discharge.     Extraocular Movements: Extraocular movements intact.     Conjunctiva/sclera: Conjunctivae normal.     Pupils: Pupils are equal, round, and reactive to light.     Comments: No racoon eyes, conjunctival erythema, evidence of foreign body, or periorbital edema bilaterally  Neck:     Comments: No neck edema. Trachea is midline. Normal phonation. No evidence of bruising or injury.  C-collar in place. Cardiovascular:     Rate and Rhythm: Normal rate and regular rhythm.     Pulses: Normal pulses.          Radial pulses are 2+ on the right side and 2+ on the left side.  Dorsalis pedis pulses are 2+ on the right side and 2+ on the left side.     Heart sounds: Normal heart sounds. No murmur heard.    No friction rub. No gallop.  Pulmonary:     Effort: Pulmonary effort is normal. No respiratory distress.     Breath sounds: Normal breath sounds. No stridor. No wheezing, rhonchi or rales.     Comments: Equal breath rise. Trachea midline. Lung sounds clear bilaterally. No evidence of chest wall crepitus.  Chest:     Chest wall: Tenderness present.  Abdominal:     General: Abdomen is flat. There is no distension.     Palpations: Abdomen is soft.     Tenderness: There is abdominal tenderness. There is no right CVA tenderness, left CVA tenderness, guarding or rebound.     Comments: Abdomen is soft, generalized tenderness to palpation. No peritoneal signs. No signs of bruising or other injury.  Musculoskeletal:        General: No swelling, tenderness, deformity or signs of injury. Normal range of motion.     Cervical back: Normal range of motion and neck supple. No tenderness.     Right lower leg: No edema.     Left lower leg: No edema.     Comments: C spine: UTA with c collar T spine: She does have t spine midline tenderness to palpation as  well as reproducible bilateral paraspinal muscle tenderness. L spine: No L spine midline ttp or stepoff noted. No reproducible paraspinal muscle ttp.  Thoracic cage: Anterior chest wall ttp, no stepoffs or deformity noted to anterior, lateral, and posterior wall of rib cage.  Upper extremities: No bilateral shoulder, elbow, or wrist ttp, deformity, or swelling noted. Able to range all joints without difficulty. No other bony tenderness to bilateral long bones.  Lower extremities: Pelvis feels stable. No ttp of bilateral, anterior, or posterior pelvis. Able to range hip bilaterally without pain or difficulty. No crepitus heard. No deformity, swelling, or ttp of bilateral knees or ankles. Able to range all joints without difficulty. No other bony tenderness to bilateral long bones.   Skin:    General: Skin is warm and dry.     Findings: No bruising, erythema or lesion.     Comments: No bruising, lacerations, abrasions, or skin tears evident on skin.   Neurological:     General: No focal deficit present.     Mental Status: She is alert and oriented to person, place, and time.     Cranial Nerves: No cranial nerve deficit.     Sensory: No sensory deficit.     Motor: No weakness.     Coordination: Coordination normal.     Gait: Gait normal.     Comments: Alert and Oriented x 3 Speech clear with no aphasia Cranial Nerve testing - Visual Fields grossly intact - PERRLA. EOM intact. No Nystagmus - Facial Sensation grossly intact - No facial asymmetry - Uvula and Tongue Midline - Accessory Muscles intact Motor: - 5/5 motor strength in all four extremities.  - No pronator Drift - Normal tone Sensation: - Grossly intact in all four extremities.  Coordination:  - Finger to nose and heel to shin intact bilaterally - Gait without abnormality.   Psychiatric:        Mood and Affect: Mood normal.        Behavior: Behavior normal.     ED Results / Procedures / Treatments   Labs (all labs  ordered are listed, but  only abnormal results are displayed) Labs Reviewed  COMPREHENSIVE METABOLIC PANEL - Abnormal; Notable for the following components:      Result Value   Glucose, Bld 135 (*)    All other components within normal limits  CBC - Abnormal; Notable for the following components:   RBC 3.47 (*)    Hemoglobin 11.3 (*)    HCT 34.9 (*)    MCV 100.6 (*)    All other components within normal limits  URINALYSIS, ROUTINE W REFLEX MICROSCOPIC - Abnormal; Notable for the following components:   Specific Gravity, Urine >1.046 (*)    Nitrite POSITIVE (*)    Leukocytes,Ua SMALL (*)    Bacteria, UA FEW (*)    All other components within normal limits  I-STAT CHEM 8, ED - Abnormal; Notable for the following components:   Glucose, Bld 132 (*)    Hemoglobin 11.9 (*)    HCT 35.0 (*)    All other components within normal limits  ETHANOL  LACTIC ACID, PLASMA  PROTIME-INR  SAMPLE TO BLOOD BANK    EKG None  Radiology CT T-SPINE NO CHARGE  Result Date: 07/21/2021 CLINICAL DATA:  Trauma EXAM: CT THORACIC SPINE WITHOUT CONTRAST TECHNIQUE: Multidetector CT images of the thoracic were obtained using the standard protocol without intravenous contrast. RADIATION DOSE REDUCTION: This exam was performed according to the departmental dose-optimization program which includes automated exposure control, adjustment of the mA and/or kV according to patient size and/or use of iterative reconstruction technique. COMPARISON:  Chest CT 09/10/2018 FINDINGS: Alignment: Mildly exaggerated thoracic kyphosis. Vertebrae: There is a chronic compression deformity of T8 with approximately 30% height loss, unchanged since July 2020. Unchanged mild superior endplate compression of T10. There is no new compression fracture in the thoracic spine. Paraspinal and other soft tissues: Reported separately on CT chest. Disc levels: There are mild-to-moderate multilevel degenerative changes. No visible impingement on  noncontrast CT. IMPRESSION: No acute thoracic spine fracture. Unchanged chronic compression deformities of T8 and T10. Electronically Signed   By: Maurine Simmering M.D.   On: 07/21/2021 14:52   CT HEAD WO CONTRAST  Result Date: 07/21/2021 CLINICAL DATA:  Neck trauma (Age >= 65y); Head trauma, minor (Age >= 65y) EXAM: CT HEAD WITHOUT CONTRAST CT CERVICAL SPINE WITHOUT CONTRAST TECHNIQUE: Multidetector CT imaging of the head and cervical spine was performed following the standard protocol without intravenous contrast. Multiplanar CT image reconstructions of the cervical spine were also generated. RADIATION DOSE REDUCTION: This exam was performed according to the departmental dose-optimization program which includes automated exposure control, adjustment of the mA and/or kV according to patient size and/or use of iterative reconstruction technique. COMPARISON:  Head CT 04/29/2016. FINDINGS: CT HEAD FINDINGS Brain: Right frontal approach ventriculostomy catheter terminates in the posterior horn of the right lateral ventricle, unchanged. Hypodensity along the catheter tract in the right frontal lobe without evidence of tract hemorrhage.The ventricular system is unchanged in size. For reference, the third ventricle measures 1.3 cm, previously 1.3 cm.There is no evidence of acute intracranial hemorrhage or abnormal extra-axial collection. No concerning mass effect. There is no new loss of gray-white matter differentiation.Scattered subcortical and periventricular white matter hypodensities, nonspecific but likely sequela of chronic small vessel ischemic disease.Mild cerebral atrophy. Known extra-axial mass along the posterior right temporal lobe measures 1.6 x 1.2 cm, previously 0.8 x 0.9 cm. Vascular: Vascular calcifications.  No hyperdense vessel. Skull: Negative for skull fracture. Sinuses/Orbits: The paranasal sinuses are predominantly clear. Mastoid air cells are clear. Orbits are unremarkable. Other:  None. CT CERVICAL  SPINE FINDINGS Alignment: Grade 1 anterolisthesis at C4-C5 and C5-C6. Skull base and vertebrae: There is no evidence of acute cervical spine fracture. There is no aggressive osseous lesion. Soft tissues and spinal canal: No prevertebral fluid or swelling. No visible canal hematoma. Disc levels: There is multilevel degenerative disc disease, moderate to severe at C3-C4 and C6-C7. There is degenerative grade 1 anterolisthesis at C4-C5 and C5-C6. There are posterior disc osteophyte complexes, largest at C4-C5 which likely results in a degree of spinal canal stenosis. There is bilateral neural foraminal stenosis at C4-C5. Upper chest: Negative. Other: None. IMPRESSION: No acute intracranial abnormality. Unchanged size of the ventricular system and position of right frontal approach ventriculostomy catheter. Increased size of a probable meningioma along the right posterior temporal lobe measuring 1.6 x 1.2 cm, previously 0.8 x 0.9 cm. No acute cervical spine fracture. Multilevel degenerative disc disease and facet arthropathy. Large posterior disc osteophyte complex at C4-C5 results in degree of central spinal canal stenosis. Electronically Signed   By: Maurine Simmering M.D.   On: 07/21/2021 14:49   CT CERVICAL SPINE WO CONTRAST  Result Date: 07/21/2021 CLINICAL DATA:  Neck trauma (Age >= 65y); Head trauma, minor (Age >= 65y) EXAM: CT HEAD WITHOUT CONTRAST CT CERVICAL SPINE WITHOUT CONTRAST TECHNIQUE: Multidetector CT imaging of the head and cervical spine was performed following the standard protocol without intravenous contrast. Multiplanar CT image reconstructions of the cervical spine were also generated. RADIATION DOSE REDUCTION: This exam was performed according to the departmental dose-optimization program which includes automated exposure control, adjustment of the mA and/or kV according to patient size and/or use of iterative reconstruction technique. COMPARISON:  Head CT 04/29/2016. FINDINGS: CT HEAD FINDINGS  Brain: Right frontal approach ventriculostomy catheter terminates in the posterior horn of the right lateral ventricle, unchanged. Hypodensity along the catheter tract in the right frontal lobe without evidence of tract hemorrhage.The ventricular system is unchanged in size. For reference, the third ventricle measures 1.3 cm, previously 1.3 cm.There is no evidence of acute intracranial hemorrhage or abnormal extra-axial collection. No concerning mass effect. There is no new loss of gray-white matter differentiation.Scattered subcortical and periventricular white matter hypodensities, nonspecific but likely sequela of chronic small vessel ischemic disease.Mild cerebral atrophy. Known extra-axial mass along the posterior right temporal lobe measures 1.6 x 1.2 cm, previously 0.8 x 0.9 cm. Vascular: Vascular calcifications.  No hyperdense vessel. Skull: Negative for skull fracture. Sinuses/Orbits: The paranasal sinuses are predominantly clear. Mastoid air cells are clear. Orbits are unremarkable. Other: None. CT CERVICAL SPINE FINDINGS Alignment: Grade 1 anterolisthesis at C4-C5 and C5-C6. Skull base and vertebrae: There is no evidence of acute cervical spine fracture. There is no aggressive osseous lesion. Soft tissues and spinal canal: No prevertebral fluid or swelling. No visible canal hematoma. Disc levels: There is multilevel degenerative disc disease, moderate to severe at C3-C4 and C6-C7. There is degenerative grade 1 anterolisthesis at C4-C5 and C5-C6. There are posterior disc osteophyte complexes, largest at C4-C5 which likely results in a degree of spinal canal stenosis. There is bilateral neural foraminal stenosis at C4-C5. Upper chest: Negative. Other: None. IMPRESSION: No acute intracranial abnormality. Unchanged size of the ventricular system and position of right frontal approach ventriculostomy catheter. Increased size of a probable meningioma along the right posterior temporal lobe measuring 1.6 x 1.2  cm, previously 0.8 x 0.9 cm. No acute cervical spine fracture. Multilevel degenerative disc disease and facet arthropathy. Large posterior disc osteophyte complex at C4-C5 results in degree  of central spinal canal stenosis. Electronically Signed   By: Maurine Simmering M.D.   On: 07/21/2021 14:49   CT CHEST ABDOMEN PELVIS W CONTRAST  Result Date: 07/21/2021 CLINICAL DATA:  Polytrauma, blunt EXAM: CT CHEST, ABDOMEN, AND PELVIS WITH CONTRAST TECHNIQUE: Multidetector CT imaging of the chest, abdomen and pelvis was performed following the standard protocol during bolus administration of intravenous contrast. RADIATION DOSE REDUCTION: This exam was performed according to the departmental dose-optimization program which includes automated exposure control, adjustment of the mA and/or kV according to patient size and/or use of iterative reconstruction technique. CONTRAST:  180mL OMNIPAQUE IOHEXOL 300 MG/ML  SOLN COMPARISON:  Chest CT 09/10/2018, CT abdomen pelvis 08/23/2016 FINDINGS: CT CHEST FINDINGS Cardiovascular: Normal cardiac size.No pericardial disease.Mitral annular, coronary artery, and aortic valve calcifications.Mild atherosclerosis of the thoracic aorta. Mediastinum/Nodes: No lymphadenopathy.Atrophic thyroid.Esophagus is unremarkable.The trachea is unremarkable. Lungs/Pleura: Airways are clear. No focal airspace consolidation.No suspicious pulmonary nodules.No pleural effusion.No pneumothorax. Musculoskeletal: No acute osseous abnormality.No suspicious osseous lesion. Subacute-chronic right lateral tenth rib injury. There is fusion of the sternomanubrial joint. No sternal fracture. There is a chronic compression deformity of T8 and of the superior endplate of QA348G. No new thoracic spine compression fracture. 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 CT ABDOMEN PELVIS FINDINGS Hepatobiliary: No hepatic injury or perihepatic hematoma. Prior cholecystectomy. Pancreas: Unremarkable. No pancreatic ductal  dilatation or surrounding inflammatory changes. Spleen: No splenic injury or perisplenic hematoma. Adrenals/Urinary Tract: No adrenal hemorrhage or renal injury identified. Bladder is unremarkable. No hydronephrosis or nephrolithiasis. Stomach/Bowel: The stomach is within normal limits. There is no evidence of bowel obstruction. The appendix is normal. Colonic diverticulitis mostly affecting the descending and sigmoid colon. Vascular/Lymphatic: Aortoiliac atherosclerosis. No AAA. No lymphadenopathy. Reproductive: Unremarkable. Other: No abdominal wall hernia or abnormality. No abdominopelvic ascites. Musculoskeletal: There is a superior endplate compression deformity of L3 which is new in comparison to prior CT in 2018. There is approximately 25% height loss. Multilevel degenerative changes most severe at L4-L5 and L5-S1. Unchanged grade 1 anterolisthesis at L5-S1. Severe lower lumbar predominant facet arthropathy. There is no evidence of acute fracture involving the pelvis or visualized proximal femurs. IMPRESSION: No evidence of acute trauma in the chest. Subacute-chronic appearing right lateral tenth rib injury. Chronic compression deformity of T8 of the superior endplate of QA348G. L3 compression deformity with approximately 25% height loss, age indeterminate but new since July 2018. No other evidence of acute trauma in the abdomen or pelvis. Electronically Signed   By: Maurine Simmering M.D.   On: 07/21/2021 14:32   DG Pelvis Portable  Result Date: 07/21/2021 CLINICAL DATA:  Trauma, fall, left hip pain EXAM: PORTABLE PELVIS 1-2 VIEWS COMPARISON:  None Available. FINDINGS: No fracture or dislocation is seen. Joint spaces in both hips appear symmetrical. IMPRESSION: No fracture or dislocation is seen in the pelvis. Electronically Signed   By: Elmer Picker M.D.   On: 07/21/2021 13:20   DG Chest Port 1 View  Result Date: 07/21/2021 CLINICAL DATA:  Trauma, fall EXAM: PORTABLE CHEST 1 VIEW COMPARISON:   09/06/2018 FINDINGS: Cardiac size is within normal limits. Apparent shift of mediastinum to the right may be due to rotation. Lung fields are clear of any infiltrates or pulmonary edema. There is no pleural effusion or pneumothorax. IMPRESSION: No active disease. Electronically Signed   By: Elmer Picker M.D.   On: 07/21/2021 13:18    Procedures Procedures  This patient was on telemetry or cardiac monitoring during their time in the  ED.    Medications Ordered in ED Medications  lidocaine-EPINEPHrine (XYLOCAINE W/EPI) 2 %-1:200000 (PF) injection 20 mL (has no administration in time range)  HYDROcodone-acetaminophen (NORCO/VICODIN) 5-325 MG per tablet 1 tablet (has no administration in time range)  Tdap (BOOSTRIX) injection 0.5 mL (0.5 mLs Intramuscular Given 07/21/21 1341)  iohexol (OMNIPAQUE) 300 MG/ML solution 100 mL (100 mLs Intravenous Contrast Given 07/21/21 1349)    ED Course/ Medical Decision Making/ A&P Clinical Course as of 07/21/21 1551  Sat Jul 21, 2021  1544 DG Pelvis Portable [GL]    Clinical Course User Index [GL] Adolphus Birchwood, PA-C                           Medical Decision Making Amount and/or Complexity of Data Reviewed Labs: ordered. Radiology: ordered.  Risk Prescription drug management.    MDM  This is a 81 y.o. female who presents to the ED with mechanical ground level fall not on thinners  My Impression, Plan, and ED Course:  Patient with stable vitals Dried blood and possible laceration to occiput. Complains of neck pain and C collar in place. Obtain CT head and C spine. She does have anterior chest wall tenderness as well as generalized abdominal tenderness. Given age of patient, will get CT chest, abdomen, pelvis as well as T spine with midline ttp.   I personally ordered, reviewed, and interpreted all laboratory work and imaging and agree with radiologist interpretation. Results interpreted below:  CBC reassuring with stable anemia. CMP  unremarkable. Ethanol not detectable. Lactate normal. Coags normal.  CXR without acute findings Pelvic XR without acute findings. CT chest, abdomen, pelvis: no acute trauma. Subacute to chronic right lateral 10th rib injury noted. No point tenderness on exam, so suspect chronic. L3 compression deformity with 25 % height loss age indeterminate. No point tenderness here so doubt acute. Chronic T8 compression deformity noted.  CT Thoracic with no acute findings CT C spine with no acute findings. CT head with no acute intracranial abnormality. VP shunt in place. Increased size of meningioma along the right posterior temporal lobe, no 1.6 x 1.2 cm  No acute findings today related to traumatic injury.  She does have the incidental worsening known meningioma, but this can be worked up outpatient. I was able to fully visualize scalp laceration and there is a small 1 cm linear cut, but it is well approximated, no active bleeding, and does not separate. Do not think she requires closure at this time. Tdap was updated today.  I discussed all results with patient and her daughter at bedside. Do not think she requires any further workup. Pain is well controlled. Stable for discharge.        Charting Requirements Additional history is obtained from:  Independent historian and Child External Records from outside source obtained and reviewed including: reviewed prior imaging results and labs Social Determinants of Health:  none Pertinant PMH that complicates patient's illness: baseline dementia, chronic back pain  Patient Care Problems that were addressed during this visit: - fall: Acute illness with complication - head injury: Acute illness This patient was maintained on a cardiac monitor/telemetry. I personally viewed and interpreted the cardiac monitor which reveals an underlying rhythm of NSR Medications given in ED: Norco, Tdap Reevaluation of the patient after these medicines showed that the  patient improved I have reviewed home medications and made changes accordingly. Disposition: Discharge  This is a shared visit with my attending physician,  Dr. Sherry Ruffing.  We have discussed this patient and they have independently evaluated this patient. The plan was altered or changed as needed.  Portions of this note were generated with [employer]. Dictation errors may occur despite best attempts at proofreading.    Final Clinical Impression(s) / ED Diagnoses Final diagnoses:  Fall, initial encounter  Injury of head, initial encounter    Rx / DC Orders ED Discharge Orders     None         Adolphus Birchwood, PA-C 07/21/21 1551    Tegeler, Gwenyth Allegra, MD 07/22/21 1523

## 2021-07-21 NOTE — ED Triage Notes (Signed)
Pt bib ems for fall from standing.  Pt feel back from walker, hit her head on hand rail and on ground.  Per ems, pt has 1/2" laceration to back of head, no bleeding noted and bandage in place by ems. Pt in c-collar by ems for c-spine precautions. Pt denies LOC, no blood thinners.

## 2021-07-21 NOTE — Discharge Instructions (Signed)
Your imaging today was normal. We did not find any new injuries. Your CT head did reveal a slightly larger meningioma than previously seen on imaging. This is a typically benign tumor, but I would follow up with your PCP regarding this. You can take your home Norco for aches and pains at home.

## 2021-11-21 ENCOUNTER — Encounter: Payer: Self-pay | Admitting: Internal Medicine

## 2021-11-21 ENCOUNTER — Non-Acute Institutional Stay: Payer: Medicare Other | Admitting: Internal Medicine

## 2021-11-21 VITALS — BP 140/76 | HR 68 | Temp 97.0°F | Resp 18 | Ht 63.0 in | Wt 114.2 lb

## 2021-11-21 DIAGNOSIS — Z515 Encounter for palliative care: Secondary | ICD-10-CM

## 2021-11-21 DIAGNOSIS — F0390 Unspecified dementia without behavioral disturbance: Secondary | ICD-10-CM

## 2021-11-21 DIAGNOSIS — R1312 Dysphagia, oropharyngeal phase: Secondary | ICD-10-CM

## 2021-11-21 DIAGNOSIS — R634 Abnormal weight loss: Secondary | ICD-10-CM

## 2021-11-21 DIAGNOSIS — R296 Repeated falls: Secondary | ICD-10-CM

## 2021-11-21 NOTE — Progress Notes (Signed)
[facility] Palliative Care Consult Note Telephone: [phone]  Fax: [phone]   Date of encounter: 11/21/21 9:03 AM PATIENT NAME: Rachel Dickerson [place] [id number]   [phone] (home)  DOB: Jul 24, 1940 MRN: [id number] PRIMARY CARE PROVIDER:    Elmore Guise, [place] [address] [place] [address] [phone]  REFERRING PROVIDER:   Elmore Guise, [place] [address] [place] [address] [phone]  RESPONSIBLE PARTY:    Contact Information     Name Relation Home Work Mobile   Hopkins,Tammy Niece [phone]     Gara Kroner Niece [phone]     Kerby Nora Other   [phone]        I met face to face with patient and family in [facility]. Palliative Care was asked to follow this patient by consultation request of  Demarchi, Satira Anis, MD and Martie Lee, AGNP-C ([facility]) to address advance care planning and complex medical decision making. This is the initial visit.                                     ASSESSMENT AND PLAN / RECOMMENDATIONS:   Advance Care Planning/Goals of Care: Goals include to maximize quality of life and symptom management. Patient/health care surrogate gave his/her permission to discuss.Our advance care planning conversation included a discussion about:    The value and importance of advance care planning  Experiences with loved ones who have been seriously ill or have died  Exploration of personal, cultural or spiritual beliefs that might influence medical decisions  Exploration of goals of care in the event of a sudden injury or illness  Identification  of a healthcare agent--niece, Lavonda Jumbo [phone] Review and updating or creation of an  advance directive document . Decision not to resuscitate or to de-escalate disease focused treatments due to poor prognosis. CODE STATUS:  DNR  Symptom Management/Plan: 1. Major  neurocognitive disorder ([place]) -significantly progressive over past year -more hallucinations, falls, mood disorder -losing weight and has dysphagia -now incontinent to B&B  2. Oropharyngeal dysphagia -has h/o barrett's esophagus and also has AD causing her neurocognitive disorder so some dysphagia related to that--needing cues to eat and losing weight -cont aspiration precautions  3. Recurrent falls -due to lack of safety awareness, progression of dementia -uses wheelchair for mobility  4. Abnormal weight loss -NP notes down 15 lbs in 2 months in [facility] records -requiring cues to eat -overall decline from her dementia  5. Palliative care encounter -will contact niece to discuss goals of care and provide further ed and support as NP reports she has not been ready to accept hospice though should be eligible due to weight loss  Follow up Palliative Care Visit: Palliative care will continue to follow for complex medical decision making, advance care planning, and clarification of goals. Return 4-6 wks   This visit was coded based on medical decision making (MDM).  PPS: 30%  HOSPICE ELIGIBILITY/DIAGNOSIS: yes/abnormal weight loss  Chief Complaint: Initial palliative care consult at [facility]:  Rachel Dickerson is a 81 y.o. year old female  with Alzheimer's disease with hallucinations, oropharyngeal dysphagia, COPD, afib, osteoporosis, psoriasis, barrett's esophagus, gerd, hypothyroidism, overactive bladder, D deficiency, CKD, restless legs, dry eyes, depression, anxiety seen in palliative care consult at the request of Martie Lee,  NP with [facility].  Pt recently sustained a fall.     Upon review of [employer], pt has dropped 15 lbs in the past 2 months.  She weighs 114.2 lb now down from 129.4 lbs 09/19/21.  About a year ago on 11/24/20, she weighed 156.6 lbs. dysphagia advanced diet with thin liquids); PO Intake 50% of meals. Resident is able to  feed self with set up. Receiving supplementation: Ensure TID; magic cup TID with meals. No skin breakdown ST, PT, OT ordered 10/6 and did above Remeron 7.40m was restarted 10/6 plus order for uKoreato see her   History obtained from review of EMR, discussion with primary team, and interview with family, facility staff/caregiver and/or Ms. Wineland.   I reviewed available labs, medications, imaging, studies and related documents from the EMR.  Records reviewed and summarized above.   ROS  Review of Systems  Physical Exam: Vitals:   11/21/21 0902  BP: (!) 140/76  Pulse: 68  Resp: 18  Temp: (!) 97 F (36.1 C)  SpO2: 97%  Weight: 114 lb 3.2 oz (51.8 kg)  Height: '5\' 3"'  (1.6 m)   Body mass index is 20.23 kg/m. Wt Readings from Last 500 Encounters:  11/21/21 114 lb 3.2 oz (51.8 kg)  07/21/21 121 lb 4.1 oz (55 kg)  12/31/19 122 lb (55.3 kg)  10/12/19 122 lb (55.3 kg)  06/01/19 122 lb (55.3 kg)  04/19/19 116 lb 14.4 oz (53 kg)  03/23/19 117 lb 3.2 oz (53.2 kg)  03/22/19 117 lb 3.2 oz (53.2 kg)  03/01/19 117 lb 3.2 oz (53.2 kg)  02/02/19 116 lb 1.6 oz (52.7 kg)  01/25/19 116 lb 12.8 oz (53 kg)  01/04/19 116 lb 12.8 oz (53 kg)  12/29/18 116 lb 12.8 oz (53 kg)  12/01/18 116 lb 11.2 oz (52.9 kg)  10/21/18 122 lb (55.3 kg)  10/14/18 124 lb 3.2 oz (56.3 kg)  09/30/18 125 lb (56.7 kg)  09/25/18 125 lb (56.7 kg)  09/18/18 142 lb 9.6 oz (64.7 kg)  09/16/18 142 lb 9.6 oz (64.7 kg)  08/27/18 142 lb 9.6 oz (64.7 kg)  08/24/18 142 lb 9.6 oz (64.7 kg)  07/29/18 146 lb 3.2 oz (66.3 kg)  07/24/18 146 lb 3.2 oz (66.3 kg)  07/01/18 150 lb 3.2 oz (68.1 kg)  06/15/18 149 lb (67.6 kg)  05/15/18 147 lb 12.8 oz (67 kg)  05/10/18 147 lb 12.8 oz (67 kg)  05/01/18 147 lb 12.8 oz (67 kg)  04/20/18 147 lb 12.8 oz (67 kg)  04/17/18 149 lb (67.6 kg)  03/18/18 146 lb 6.4 oz (66.4 kg)  02/17/18 151 lb (68.5 kg)  01/15/18 151 lb 12.8 oz (68.9 kg)  12/16/17 153 lb (69.4 kg)  11/13/17 153 lb 9.6 oz  (69.7 kg)  11/04/17 150 lb (68 kg)  10/16/17 150 lb (68 kg)  09/19/17 150 lb (68 kg)  08/13/17 149 lb 9.6 oz (67.9 kg)  07/31/17 149 lb (67.6 kg)  07/17/17 149 lb (67.6 kg)  06/25/17 149 lb (67.6 kg)  06/03/17 152 lb 6.4 oz (69.1 kg)  05/23/17 152 lb (68.9 kg)  05/05/17 152 lb (68.9 kg)  04/30/17 152 lb (68.9 kg)  04/07/17 152 lb 6.4 oz (69.1 kg)  03/10/17 153 lb (69.4 kg)  02/06/17 150 lb (68 kg)  01/09/17 152 lb (68.9 kg)  01/07/17 152 lb (68.9 kg)  12/23/16 152 lb (68.9 kg)  11/19/16 154 lb 12.8 oz (70.2 kg)  10/22/16 151 lb (68.5 kg)  10/03/16 154 lb 3.2 oz (69.9 kg)  09/24/16 154 lb (69.9 kg)  08/30/16 163 lb (73.9 kg)  07/31/16 161 lb (73 kg)  07/31/16 159 lb (72.1 kg)  07/09/16 159 lb 6.4 oz (72.3 kg)  07/02/16 159 lb 6.4 oz (72.3 kg)  06/20/16 158 lb 6.4 oz (71.8 kg)  06/05/16 158 lb 6.4 oz (71.8 kg)  05/06/16 144 lb 10 oz (65.6 kg)  04/29/16 162 lb 9.6 oz (73.8 kg)  04/26/16 162 lb 9.6 oz (73.8 kg)  04/15/16 158 lb 12.8 oz (72 kg)  03/20/16 163 lb 6.4 oz (74.1 kg)  03/14/16 158 lb 12.8 oz (72 kg)  02/26/16 152 lb (68.9 kg)  02/13/16 152 lb 8.9 oz (69.2 kg)  01/26/16 165 lb (74.8 kg)  08/20/13 165 lb (74.8 kg)   Physical Exam  CURRENT PROBLEM LIST:  Patient Active Problem List   Diagnosis Date Noted   Dry eye syndrome 03/06/2019   Allergic rhinitis 02/07/2019   Real time reverse transcriptase PCR positive for COVID-19 virus 08/30/2018   Xerosis cutis 07/26/2018   Restless leg syndrome 05/19/2018   Chronic generalized pain 05/19/2018   Hypotension 08/31/2016   Postural dizziness with presyncope 08/31/2016   Paroxysmal atrial fibrillation ([place]) 08/31/2016   Depression 08/31/2016   Vitamin D deficiency 08/31/2016   Mood disorder ([place]) 08/22/2016   Elevated amylase and lipase 02/13/2016   Dementia with behavioral disturbance ([place]) 02/13/2016   Overactive bladder 02/13/2016   Chronic kidney disease    COPD (chronic obstructive pulmonary disease) ([place])     Anemia    Anxiety    Barrett's esophagus    Hypothyroidism    GERD (gastroesophageal reflux disease)    Osteoporosis    Psoriasis (a type of skin inflammation)    PAST MEDICAL HISTORY:  Active Ambulatory Problems    Diagnosis Date Noted   Chronic kidney disease    COPD (chronic obstructive pulmonary disease) (HCC)    Anemia    Anxiety    Barrett's esophagus    Hypothyroidism    GERD (gastroesophageal reflux disease)    Osteoporosis    Psoriasis (a type of skin inflammation)    Elevated amylase and lipase 02/13/2016   Dementia with behavioral disturbance ([place]) 02/13/2016   Overactive bladder 02/13/2016   Mood disorder ([place]) 08/22/2016   Hypotension 08/31/2016   Postural dizziness with presyncope 08/31/2016   Paroxysmal atrial fibrillation ([place]) 08/31/2016   Depression 08/31/2016   Vitamin D deficiency 08/31/2016   Restless leg syndrome 05/19/2018   Chronic generalized pain 05/19/2018   Xerosis cutis 07/26/2018   Real time reverse transcriptase PCR positive for COVID-19 virus 08/30/2018   Allergic rhinitis 02/07/2019   Dry eye syndrome 03/06/2019   Resolved Ambulatory Problems    Diagnosis Date Noted   Hypertension    Memory difficulties    Acute encephalopathy 02/13/2016   Psychosis in elderly ([place]) 02/13/2016   Necrotizing pneumonia ([place]) 05/10/2016   COPD exacerbation ([place]) 05/10/2016   UTI (urinary tract infection) 05/10/2016   Paroxysmal atrial fibrillation with RVR ([place]) 42/35/3614   Metabolic encephalopathy 43/15/4008   Chest pain 08/31/2016   Hypokalemia 08/31/2016   Bilateral pneumonia 07/26/2018   Rhonchi 07/26/2018   Dehydration 08/30/2018   Hospice care patient 09/17/2018   MRSA bacteremia 09/21/2018   Past Medical History:  Diagnosis Date   Arthritis    Asthma    Disease of thyroid gland    Diverticulitis    Hyperlipemia    Pancreatitis    Vertigo  SOCIAL HX:  Social History   Tobacco Use   Smoking status: Former    Types:  Cigarettes    Quit date: 12/17/1990    Years since quitting: 30.9   Smokeless tobacco: Never  Substance Use Topics   Alcohol use: No   FAMILY HX:  Family History  Problem Relation Age of Onset   Arthritis Mother    Heart disease Mother    Hypertension Mother    Cancer Father    Arthritis Father    Heart disease Father    Alzheimer's disease Sister    Arthritis Sister    Heart disease Sister    Memory loss Sister    Cancer Sister    Heart attack Sister    Cancer Brother    Lung disease Brother    Heart disease Brother       ALLERGIES:  Allergies  Allergen Reactions   Azithromycin Other (See Comments)    unknown   Cephalexin Other (See Comments)    unknown   Cephalosporins    Ciprofloxacin Other (See Comments)    unknown   Darvon [Propoxyphene]    Flagyl [Metronidazole] Other (See Comments)    unknown   Librax [Chlordiazepoxide-Clidinium] Other (See Comments)    unknown   Macrodantin [Nitrofurantoin Macrocrystal]    Nitrofuran Derivatives Other (See Comments)    unknown   Other Other (See Comments)    unknown   Oxycodone-Acetaminophen Other (See Comments)    unknown   Percocet [Oxycodone-Acetaminophen]    Quetiapine Other (See Comments)    unknown Potassium level dropped.   Sulfa Antibiotics    Sulfamethoxazole Other (See Comments)    unknown      PERTINENT MEDICATIONS:  MED MIRTAZAPINE 7.5 MG TABLET Give one tablet by mouth at bedtime DeMarchi, Gwyndolyn Saxon 11/16/2021 3:55 PM MED DONEPEZIL HCL 10 MG TABLET TAKE 1 TABLET BY MOUTH AT BEDTIME Unsp dementia, unsp severity, without beh/psych/mood/anx (F03.90) DeMarchi, Gwyndolyn Saxon 11/14/2021 4:00 PM MED UTI-STAT LIQUID; Give 75m by mouth daily. DeMarchi,Gwyndolyn Saxon09/08/2021 9:47 AM MED LEVOTHYROXINE 125 MCG TABLET: Give one tablet by mouth every day [facility] 10/17/2021 4:00 PM MED FAMOTIDINE 20 MG TABLET TAKE 1 TABLET BY MOUTH AT BEDTIME Gastro-esophageal reflux disease without esophagitis  (K21.9) KBarney Drain08/31/2023 5:00 PM MED MYLANTA MAXIMUM STRENGTH LIQ: Take 313mby mouth as need every 6 hours for heartburn. DeMarchi, WiGwyndolyn Saxon8/20/2023 4:00 PM MED EFFEXOR XR 37.5 MG CAPSULE :Give 1 capsule by mouth daily Major depressive disorder, single episode, unspecified (F32.9) Sareena Odeh 09/20/2021 2:13 PM MED FLUTICASONE PROP 50 MCG SPRAY SPRAY 1 SPRAY IN EACH NOSTRIL TWICE A DAY DeMarchi, William 09/11/2021 2:30 PM MED NORCO 5-325 TABLET; Take 1 tablet by mouth twice daily. *Acute pain, not elsewhere classified (G89.1) DeMarchi, WiGwyndolyn Saxon7/13/2023 10:21 AM MED SODIUM FLUORIDE 5000 DRY MOUTH BRUSH ON TEETH WITH A TOOTHBRUSH AFTER PM MOUTH CARE AT BEDTIME (SPIT OUT EXCESS AND DO NOT RINSE) DeMarchi, William 08/20/2021 11:00 AM MED ACETAMINOPHEN 325 MG TABLET TAKE 2 TABLETS (650MG) BY MOUTH EVERY 6 HOURS AS NEEDED FOR PAIN DeMarchi, William 08/03/2021 11:06 AM MED ROPINIROLE HCL 0.25 MG TABLET TAKE 1 TABLET BY MOUTH AT BEDTIME Restless legs syndrome (G25.81) DeMarchi, WiGwyndolyn Saxon6/13/2023 8:00 PM MED VITAMIN D3 50,000 UNIT CAPSULE TAKE 1 TABLET BY MOUTH ONCE A MONTH ON THE 14Clarice PoleFrAlabama3/14/2023 12:00 AM MED BIOFREEZE 4% GEL: MAY APPLY TOPICALLY TO LEFT HIP/BUTTOCKS AREA/LOWER BACK TID FOR PAIN HoMaryella Shivers8/18/2022 1:34 PM MED GUAIFENESIN 100 MG/5 ML  LIQUID Give 74m by mouth every 6 hours as needed for cough. HNyra Capes FAlabama07/19/2022 12:00 PM MED MYRBETRIQ ER 25 MG TABLET: TAKE 1 TABLET BY MOUTH DAILY Overactive bladder (N32.81) Narada Uzzle 09/14/2019 2:33 PM MED BIOFREEZE 4% GEL: apply to bilateral knees three times daily Cisco Kindt 09/10/2019 2:35 PM MED SENNA PLUS 8.6-50 MG TABLET; Give 1 tablet by mouth at bedtime. Hold for loose stools. AHennie Duos03/16/2021 2:52 AM MED ORAJEL GEL :Apply to left lower gum QID prn for pain AHennie Duos02/03/2019 3:44 PM MED POTASSIUM CL ER 20 MEQ TABLET TAKE  1 TABLET BY MOUTH ONCE DAILY (DO NOT CRUSH) AInocencio HomesD 12/20/2018 10:00 AM MED MILK OF MAGNESIA SUSPENSION TAKE 30 ML BY MOUTH EVERY DAY AS NEEDED FOR CONSTIPATION (IF NO BOWEL MOVEMENT IN 3 DAYS) AHennie Duos11/07/2018 9:00 PM MED ALBUTEROL HFA 90 MCG INHALER :Inhale 2 puffs into lungs every 6 hours as needed for wheezing or SOB Chronic obstructive pulmonary disease, unspecified (J44.9) AHennie Duos08/06/2018 11:53 AM MED SYMBICORT 160-4.5 MCG INHALER :Inhale 2 puffs into lungs every 12 hours Gargle and rinse your mouth with water after using medication Do not swallow the rinse water Chronic obstructive pulmonary disease, unspecified (J44.9) AHennie Duos08/06/2018 11:48 AM    Thank you for the opportunity to participate in the care of Ms. Bentsen.  The palliative care team will continue to follow. Please call our office at [phone] we can be of additional assistance.   THollace Kinnier DO  COVID-19 PATIENT SCREENING TOOL Asked and negative response unless otherwise noted:  Have you had symptoms of covid, tested positive or been in contact with someone with symptoms/positive test in the past 5-10 days?  NO

## 2022-01-17 ENCOUNTER — Non-Acute Institutional Stay: Payer: Medicare Other | Admitting: Hospice

## 2022-01-17 DIAGNOSIS — F339 Major depressive disorder, recurrent, unspecified: Secondary | ICD-10-CM

## 2022-01-17 DIAGNOSIS — R1312 Dysphagia, oropharyngeal phase: Secondary | ICD-10-CM

## 2022-01-17 DIAGNOSIS — J449 Chronic obstructive pulmonary disease, unspecified: Secondary | ICD-10-CM

## 2022-01-17 DIAGNOSIS — Z515 Encounter for palliative care: Secondary | ICD-10-CM

## 2022-01-17 DIAGNOSIS — F0390 Unspecified dementia without behavioral disturbance: Secondary | ICD-10-CM

## 2022-01-17 NOTE — Progress Notes (Signed)
[facility] Palliative Care Consult Note Telephone: [phone]  Fax: [phone]   Date of encounter: 01/17/22 10:54 AM PATIENT NAME: Rachel Dickerson [place] [address]   [phone] (home)  DOB: 1941-02-06 MRN: [id number] PRIMARY CARE PROVIDER:    Elmore Guise, [facility] [address] [place] [address] [address] [phone]  REFERRING PROVIDER:   Martie Lee, NP with [facility].    RESPONSIBLE PARTY:    Contact Information     Name Relation Home Work Mobile   Hopkins,Tammy Niece [phone]     Gara Kroner Niece [phone]     Kerby Nora Other   [phone]        I met face to face with patient and family in [facility] facility. Palliative Care was asked to follow this patient by consultation request of  No ref. provider found and Martie Lee, AGNP-C ([place]) to address advance care planning and complex medical decision making. Tammy is present with patient during visit.                                     ASSESSMENT AND PLAN / RECOMMENDATIONS:   Advance Care Planning/Goals of Care: Goals include to maximize quality of life and symptom management. Patient/health care surrogate gave his/her permission to discuss.Our advance care planning conversation included a discussion about:    The value and importance of advance care planning  Experiences with loved ones who have been seriously ill or have died  Exploration of personal, cultural or spiritual beliefs that might influence medical decisions  Exploration of goals of care in the event of a sudden injury or illness  Identification  of a healthcare agent--niece, Lavonda Jumbo [phone] Review and updating or creation of an  advance directive document . Decision not to resuscitate or to de-escalate disease focused treatments due to poor prognosis. CODE STATUS:  DNR  Symptom Management/Plan: Dementia: worsening with increasing memory loss and  confusion in line with Dementia disease trajectory.  Incontinent of bowel and bladder, impoverished thoughts, limited language.  Fast 7A. Continue Donepezil.  Continue ongoing supportive care.  Encourage socialization and participation in facility activities Depression: Managed with Effexor.  Oropharyngeal dysphagia: ST consult as needed.  Mechanical soft diet, downgrade if needed. Aspiration precautions.  COPD: Continue Symbicort and albuterol as ordered.  Encourage slow deep breathing.  Avoid triggers.  Follow-up with as needed/as planned  Follow up Palliative Care Visit: Palliative care will continue to follow for complex medical decision making, advance care planning, and clarification of goals. Return in 6 weeks/as needed  HOSPICE ELIGIBILITY/DIAGNOSIS: TBA.  Family reports family will be open to hospice service in the future.  Chief Complaint: Follow-up visit  HISTORY OF PRESENT ILLNESS:  Rachel Dickerson is a 81 y.o. year old female  with multiple medical comorbidities requiring close monitoring, with high risk of mortality: Dementia, oropharyngeal dysphagia.history of COPD, afib, osteoporosis, psoriasis, barrett's esophagus, gerd, hypothyroidism, overactive bladder, D deficiency, CKD, restless legs, dry eyes, depression, anxiety seen in palliative care consult. History obtained from review of EMR, discussion with primary team, and interview with family, facility staff/caregiver and/or Ms. Selmer.  I reviewed available labs, medications, imaging, studies and related documents from the EMR.  Records reviewed and summarized above.    There is no height or weight on file to calculate BMI. Wt Readings from Last 500 Encounters:  11/21/21 114  lb 3.2 oz (51.8 kg)  07/21/21 121 lb 4.1 oz (55 kg)  12/31/19 122 lb (55.3 kg)  10/12/19 122 lb (55.3 kg)  06/01/19 122 lb (55.3 kg)  04/19/19 116 lb 14.4 oz (53 kg)  03/23/19 117 lb 3.2 oz (53.2 kg)  03/22/19 117 lb 3.2 oz (53.2 kg)  03/01/19 117  lb 3.2 oz (53.2 kg)  02/02/19 116 lb 1.6 oz (52.7 kg)  01/25/19 116 lb 12.8 oz (53 kg)  01/04/19 116 lb 12.8 oz (53 kg)  12/29/18 116 lb 12.8 oz (53 kg)  12/01/18 116 lb 11.2 oz (52.9 kg)  10/21/18 122 lb (55.3 kg)  10/14/18 124 lb 3.2 oz (56.3 kg)  09/30/18 125 lb (56.7 kg)  09/25/18 125 lb (56.7 kg)  09/18/18 142 lb 9.6 oz (64.7 kg)  09/16/18 142 lb 9.6 oz (64.7 kg)  08/27/18 142 lb 9.6 oz (64.7 kg)  08/24/18 142 lb 9.6 oz (64.7 kg)  07/29/18 146 lb 3.2 oz (66.3 kg)  07/24/18 146 lb 3.2 oz (66.3 kg)  07/01/18 150 lb 3.2 oz (68.1 kg)  06/15/18 149 lb (67.6 kg)  05/15/18 147 lb 12.8 oz (67 kg)  05/10/18 147 lb 12.8 oz (67 kg)  05/01/18 147 lb 12.8 oz (67 kg)  04/20/18 147 lb 12.8 oz (67 kg)  04/17/18 149 lb (67.6 kg)  03/18/18 146 lb 6.4 oz (66.4 kg)  02/17/18 151 lb (68.5 kg)  01/15/18 151 lb 12.8 oz (68.9 kg)  12/16/17 153 lb (69.4 kg)  11/13/17 153 lb 9.6 oz (69.7 kg)  11/04/17 150 lb (68 kg)  10/16/17 150 lb (68 kg)  09/19/17 150 lb (68 kg)  08/13/17 149 lb 9.6 oz (67.9 kg)  07/31/17 149 lb (67.6 kg)  07/17/17 149 lb (67.6 kg)  06/25/17 149 lb (67.6 kg)  06/03/17 152 lb 6.4 oz (69.1 kg)  05/23/17 152 lb (68.9 kg)  05/05/17 152 lb (68.9 kg)  04/30/17 152 lb (68.9 kg)  04/07/17 152 lb 6.4 oz (69.1 kg)  03/10/17 153 lb (69.4 kg)  02/06/17 150 lb (68 kg)  01/09/17 152 lb (68.9 kg)  01/07/17 152 lb (68.9 kg)  12/23/16 152 lb (68.9 kg)  11/19/16 154 lb 12.8 oz (70.2 kg)  10/22/16 151 lb (68.5 kg)  10/03/16 154 lb 3.2 oz (69.9 kg)  09/24/16 154 lb (69.9 kg)  08/30/16 163 lb (73.9 kg)  07/31/16 161 lb (73 kg)  07/31/16 159 lb (72.1 kg)  07/09/16 159 lb 6.4 oz (72.3 kg)  07/02/16 159 lb 6.4 oz (72.3 kg)  06/20/16 158 lb 6.4 oz (71.8 kg)  06/05/16 158 lb 6.4 oz (71.8 kg)  05/06/16 144 lb 10 oz (65.6 kg)  04/29/16 162 lb 9.6 oz (73.8 kg)  04/26/16 162 lb 9.6 oz (73.8 kg)  04/15/16 158 lb 12.8 oz (72 kg)  03/20/16 163 lb 6.4 oz (74.1 kg)  03/14/16 158 lb 12.8 oz  (72 kg)  02/26/16 152 lb (68.9 kg)  02/13/16 152 lb 8.9 oz (69.2 kg)  01/26/16 165 lb (74.8 kg)  08/20/13 165 lb (74.8 kg)   Physical Exam  CURRENT PROBLEM LIST:  Patient Active Problem List   Diagnosis Date Noted   Dry eye syndrome 03/06/2019   Allergic rhinitis 02/07/2019   Real time reverse transcriptase PCR positive for COVID-19 virus 08/30/2018   Xerosis cutis 07/26/2018   Restless leg syndrome 05/19/2018   Chronic generalized pain 05/19/2018   Hypotension 08/31/2016   Postural dizziness with presyncope 08/31/2016   Paroxysmal atrial  fibrillation ([place]) 08/31/2016   Depression 08/31/2016   Vitamin D deficiency 08/31/2016   Mood disorder ([place]) 08/22/2016   Elevated amylase and lipase 02/13/2016   Dementia with behavioral disturbance ([place]) 02/13/2016   Overactive bladder 02/13/2016   Chronic kidney disease    COPD (chronic obstructive pulmonary disease) (HCC)    Anemia    Anxiety    Barrett's esophagus    Hypothyroidism    GERD (gastroesophageal reflux disease)    Osteoporosis    Psoriasis (a type of skin inflammation)    PAST MEDICAL HISTORY:  Active Ambulatory Problems    Diagnosis Date Noted   Chronic kidney disease    COPD (chronic obstructive pulmonary disease) ([place])    Anemia    Anxiety    Barrett's esophagus    Hypothyroidism    GERD (gastroesophageal reflux disease)    Osteoporosis    Psoriasis (a type of skin inflammation)    Elevated amylase and lipase 02/13/2016   Dementia with behavioral disturbance ([place]) 02/13/2016   Overactive bladder 02/13/2016   Mood disorder ([place]) 08/22/2016   Hypotension 08/31/2016   Postural dizziness with presyncope 08/31/2016   Paroxysmal atrial fibrillation ([place]) 08/31/2016   Depression 08/31/2016   Vitamin D deficiency 08/31/2016   Restless leg syndrome 05/19/2018   Chronic generalized pain 05/19/2018   Xerosis cutis 07/26/2018   Real time reverse transcriptase PCR positive for COVID-19 virus 08/30/2018   Allergic  rhinitis 02/07/2019   Dry eye syndrome 03/06/2019   Resolved Ambulatory Problems    Diagnosis Date Noted   Hypertension    Memory difficulties    Acute encephalopathy 02/13/2016   Psychosis in elderly ([place]) 02/13/2016   Necrotizing pneumonia ([place]) 05/10/2016   COPD exacerbation ([place]) 05/10/2016   UTI (urinary tract infection) 05/10/2016   Paroxysmal atrial fibrillation with RVR ([place]) 38/18/2993   Metabolic encephalopathy 71/69/6789   Chest pain 08/31/2016   Hypokalemia 08/31/2016   Bilateral pneumonia 07/26/2018   Rhonchi 07/26/2018   Dehydration 08/30/2018   Hospice care patient 09/17/2018   MRSA bacteremia 09/21/2018   Past Medical History:  Diagnosis Date   Arthritis    Asthma    Disease of thyroid gland    Diverticulitis    Hyperlipemia    Pancreatitis    Vertigo    SOCIAL HX:  Social History   Tobacco Use   Smoking status: Former    Types: Cigarettes    Quit date: 12/17/1990    Years since quitting: 31.1   Smokeless tobacco: Never  Substance Use Topics   Alcohol use: No   FAMILY HX:  Family History  Problem Relation Age of Onset   Arthritis Mother    Heart disease Mother    Hypertension Mother    Cancer Father    Arthritis Father    Heart disease Father    Alzheimer's disease Sister    Arthritis Sister    Heart disease Sister    Memory loss Sister    Cancer Sister    Heart attack Sister    Cancer Brother    Lung disease Brother    Heart disease Brother       ALLERGIES:  Allergies  Allergen Reactions   Azithromycin Other (See Comments)    unknown   Cephalexin Other (See Comments)    unknown   Cephalosporins    Ciprofloxacin Other (See Comments)    unknown   Darvon [Propoxyphene]    Flagyl [Metronidazole] Other (See Comments)    unknown   Librax [Chlordiazepoxide-Clidinium] Other (  See Comments)    unknown   Macrodantin [Nitrofurantoin Macrocrystal]    Nitrofuran Derivatives Other (See Comments)    unknown   Other Other (See Comments)     unknown   Oxycodone-Acetaminophen Other (See Comments)    unknown   Percocet [Oxycodone-Acetaminophen]    Quetiapine Other (See Comments)    unknown Potassium level dropped.   Sulfa Antibiotics    Sulfamethoxazole Other (See Comments)    unknown      I spent 45 minutes providing this consultation; this includes time spent with patient/family, chart review and documentation. More than 50% of the time in this consultation was spent on counseling and coordinating communication.   Thank you for the opportunity to participate in the care of Rachel Dickerson.  The palliative care team will continue to follow. Please call our office at [phone] if we can be of additional assistance.   Teodoro Spray, NP

## 2022-02-07 ENCOUNTER — Non-Acute Institutional Stay: Payer: Medicare Other | Admitting: Hospice

## 2022-02-07 DIAGNOSIS — F339 Major depressive disorder, recurrent, unspecified: Secondary | ICD-10-CM

## 2022-02-07 DIAGNOSIS — R1312 Dysphagia, oropharyngeal phase: Secondary | ICD-10-CM

## 2022-02-07 DIAGNOSIS — F0390 Unspecified dementia without behavioral disturbance: Secondary | ICD-10-CM

## 2022-02-07 DIAGNOSIS — J449 Chronic obstructive pulmonary disease, unspecified: Secondary | ICD-10-CM

## 2022-02-07 DIAGNOSIS — Z515 Encounter for palliative care: Secondary | ICD-10-CM

## 2022-02-07 NOTE — Progress Notes (Signed)
[facility] Palliative Care Consult Note Telephone: [phone]  Fax: [phone]   Date of encounter: 02/07/22 2:06 PM PATIENT NAME: Rachel Dickerson [place] [address]   [phone] (home)  DOB: [birth year] MRN: [id number] PRIMARY CARE PROVIDER:    Elmore Guise, [facility] [address] [place] [address] [phone]  REFERRING PROVIDER:   Martie Lee, NP with [facility].    RESPONSIBLE PARTY:   Rachel Contact Information     Name Relation Home Work Mobile   Dickerson,Rachel Niece [phone]     Gara Kroner Niece [phone]     Kerby Nora Other   [phone]        I met face to face with patient and family in [facility] facility. Palliative Care was asked to follow this patient by consultation request to address advance care planning and complex medical decision making.                                     ASSESSMENT AND PLAN / RECOMMENDATIONS:   CODE STATUS:  DNR  Symptom Management/Plan: Dementia: advanced, memory loss and confusion at baseline.  Incontinent of bowel and bladder, impoverished thoughts, limited language.  Fast 7A. Continue Donepezil.  Continue ongoing supportive care.  Encourage socialization and participation in facility activities Depression: Continue Effexor. Encourage socialization. Pysch consult as needed.  Oropharyngeal dysphagia: ST consult as needed.  Mechanical soft diet, downgrade if needed. Patient fed self during visit; no coughing, requires cueing. Continue Aspiration precautions. COPD: Continue Symbicort and albuterol as ordered.  Encourage slow deep breathing.  Avoid triggers.  Follow-up with as needed/as planned  Follow up Palliative Care Visit: Palliative care will continue to follow for complex medical decision making, advance care planning, and clarification of goals. Return in 6 weeks/as needed  HOSPICE ELIGIBILITY/DIAGNOSIS: TBA.  Family reports family  will be open to hospice service in the future.  Chief Complaint: Follow-up visit  HISTORY OF PRESENT ILLNESS:  Rachel Dickerson is a 81 y.o. year old female  with multiple medical comorbidities requiring close monitoring, with high risk of mortality: Dementia, oropharyngeal dysphagia.history of COPD, afib, osteoporosis, psoriasis, barrett's esophagus, gerd, hypothyroidism, overactive bladder, D deficiency, CKD, restless legs, dry eyes, depression, anxiety seen in palliative care consult. History obtained from review of EMR, discussion with primary team, and interview with family, facility staff/caregiver and/or [name].  I reviewed available labs, medications, imaging, studies and related documents from the EMR.  Records reviewed and summarized above.    CURRENT PROBLEM LIST:  Patient Active Problem List   Diagnosis Date Noted   Dry eye syndrome 03/06/2019   Allergic rhinitis 02/07/2019   Real time reverse transcriptase PCR positive for COVID-19 virus 08/30/2018   Xerosis cutis 07/26/2018   Restless leg syndrome 05/19/2018   Chronic generalized pain 05/19/2018   Hypotension 08/31/2016   Postural dizziness with presyncope 08/31/2016   Paroxysmal atrial fibrillation ([place]) 08/31/2016   Depression 08/31/2016   Vitamin D deficiency 08/31/2016   Mood disorder ([place]) 08/22/2016   Elevated amylase and lipase 02/13/2016   Dementia with behavioral disturbance ([place]) 02/13/2016   Overactive bladder 02/13/2016   Chronic kidney disease    COPD (chronic obstructive pulmonary disease) (HCC)    Anemia    Anxiety    Barrett's esophagus    Hypothyroidism    GERD (gastroesophageal reflux disease)    Osteoporosis  Psoriasis (a type of skin inflammation)    PAST MEDICAL HISTORY:  Active Ambulatory Problems    Diagnosis Date Noted   Chronic kidney disease    COPD (chronic obstructive pulmonary disease) ([place])    Anemia    Anxiety    Barrett's esophagus    Hypothyroidism    GERD  (gastroesophageal reflux disease)    Osteoporosis    Psoriasis (a type of skin inflammation)    Elevated amylase and lipase 02/13/2016   Dementia with behavioral disturbance ([place]) 02/13/2016   Overactive bladder 02/13/2016   Mood disorder ([place]) 08/22/2016   Hypotension 08/31/2016   Postural dizziness with presyncope 08/31/2016   Paroxysmal atrial fibrillation ([place]) 08/31/2016   Depression 08/31/2016   Vitamin D deficiency 08/31/2016   Restless leg syndrome 05/19/2018   Chronic generalized pain 05/19/2018   Xerosis cutis 07/26/2018   Real time reverse transcriptase PCR positive for COVID-19 virus 08/30/2018   Allergic rhinitis 02/07/2019   Dry eye syndrome 03/06/2019   Resolved Ambulatory Problems    Diagnosis Date Noted   Hypertension    Memory difficulties    Acute encephalopathy 02/13/2016   Psychosis in elderly ([employer]) 02/13/2016   Necrotizing pneumonia ([place]) 05/10/2016   COPD exacerbation ([place]) 05/10/2016   UTI (urinary tract infection) 05/10/2016   Paroxysmal atrial fibrillation with RVR ([place]) 24/26/8341   Metabolic encephalopathy 96/22/2979   Chest pain 08/31/2016   Hypokalemia 08/31/2016   Bilateral pneumonia 07/26/2018   Rhonchi 07/26/2018   Dehydration 08/30/2018   Hospice care patient 09/17/2018   MRSA bacteremia 09/21/2018   Past Medical History:  Diagnosis Date   Arthritis    Asthma    Disease of thyroid gland    Diverticulitis    Hyperlipemia    Pancreatitis    Vertigo    SOCIAL HX:  Social History   Tobacco Use   Smoking status: Former    Types: Cigarettes    Quit date: 12/17/1990    Years since quitting: 31.1   Smokeless tobacco: Never  Substance Use Topics   Alcohol use: No   FAMILY HX:  Family History  Problem Relation Age of Onset   Arthritis Mother    Heart disease Mother    Hypertension Mother    Cancer Father    Arthritis Father    Heart disease Father    Alzheimer's disease Sister    Arthritis Sister    Heart disease Sister     Memory loss Sister    Cancer Sister    Heart attack Sister    Cancer Brother    Lung disease Brother    Heart disease Brother       ALLERGIES:  Allergies  Allergen Reactions   Azithromycin Other (See Comments)    unknown   Cephalexin Other (See Comments)    unknown   Cephalosporins    Ciprofloxacin Other (See Comments)    unknown   Darvon [Propoxyphene]    Flagyl [Metronidazole] Other (See Comments)    unknown   Librax [Chlordiazepoxide-Clidinium] Other (See Comments)    unknown   Macrodantin [Nitrofurantoin Macrocrystal]    Nitrofuran Derivatives Other (See Comments)    unknown   Other Other (See Comments)    unknown   Oxycodone-Acetaminophen Other (See Comments)    unknown   Percocet [Oxycodone-Acetaminophen]    Quetiapine Other (See Comments)    unknown Potassium level dropped.   Sulfa Antibiotics    Sulfamethoxazole Other (See Comments)    unknown      I spent  40 minutes providing this consultation; this includes time spent with patient/family, chart review and documentation. More than 50% of the time in this consultation was spent on counseling and coordinating communication.   Thank you for the opportunity to participate in the care of Ms. Kiesler.  The palliative care team will continue to follow. Please call our office at [phone] if we can be of additional assistance.   Teodoro Spray, NP

## 2022-03-28 ENCOUNTER — Non-Acute Institutional Stay: Payer: Medicare Other | Admitting: Hospice

## 2022-03-28 DIAGNOSIS — J449 Chronic obstructive pulmonary disease, unspecified: Secondary | ICD-10-CM

## 2022-03-28 DIAGNOSIS — R1312 Dysphagia, oropharyngeal phase: Secondary | ICD-10-CM

## 2022-03-28 DIAGNOSIS — F339 Major depressive disorder, recurrent, unspecified: Secondary | ICD-10-CM

## 2022-03-28 DIAGNOSIS — Z515 Encounter for palliative care: Secondary | ICD-10-CM

## 2022-03-28 DIAGNOSIS — F0390 Unspecified dementia without behavioral disturbance: Secondary | ICD-10-CM

## 2022-03-28 NOTE — Progress Notes (Signed)
[facility] Palliative Care Consult Note Telephone: [phone]  Fax: [phone]   Date of encounter: 02/07/22 2:06 PM PATIENT NAME: Rachel Dickerson [place] [address]   [phone] (home)  DOB: 07-May-1940 MRN: [id number] PRIMARY CARE PROVIDER:    Elmore Guise, [facility] [address] [place] [address] [phone]  REFERRING PROVIDER:   Martie Lee, NP with [facility].    RESPONSIBLE PARTY:   Tammy Contact Information     Name Relation Home Work Mobile   Hopkins,Tammy Niece [phone]     Gara Kroner Niece [phone]     Kerby Nora Other   [id number]        I met face to face with patient and family in [facility] facility. Palliative Care was asked to follow this patient by consultation request to address advance care planning and complex medical decision making.                                     ASSESSMENT AND PLAN / RECOMMENDATIONS:   CODE STATUS:  DNR  Symptom Management/Plan: Dementia: Continue donepezil and ongoing supportive care.  Provide redirection, cueing fall/safety precautions. Continue Donepezil.  Continue ongoing supportive care.  Encourage socialization and participation in facility activities. FAST 7A, FLACC 0.  Depression: Continue Effexor. Encourage socialization. Pysch consult as needed.  Oropharyngeal dysphagia: ST consult as needed.  Mechanical soft diet, downgrade if needed. Continue Aspiration precautions. COPD: No exacerbation.  Continue Symbicort and albuterol as ordered.  Encourage slow deep breathing.  Avoid triggers.  Follow-up with as needed/as planned  Follow up Palliative Care Visit: Palliative care will continue to follow for complex medical decision making, advance care planning, and clarification of goals. Return in 6 weeks/as needed  HOSPICE ELIGIBILITY/DIAGNOSIS: TBA.  Family reports family will be open to hospice service in the future.  Chief  Complaint: Follow-up visit  HISTORY OF PRESENT ILLNESS:  Rachel Dickerson is a 82 y.o. year old female  with multiple medical comorbidities requiring close monitoring, with high risk of mortality: Dementia, oropharyngeal dysphagia.history of COPD, afib, osteoporosis, psoriasis, barrett's esophagus, gerd, hypothyroidism, overactive bladder, D deficiency, CKD, restless legs, dry eyes, depression, anxiety seen in palliative care consult. History obtained from review of EMR, discussion with primary team, and interview with family, facility staff/caregiver and/or Ms. Norkus.  I reviewed available labs, medications, imaging, studies and related documents from the EMR.  Records reviewed and summarized above.    CURRENT PROBLEM LIST:  Patient Active Problem List   Diagnosis Date Noted   Dry eye syndrome 03/06/2019   Allergic rhinitis 02/07/2019   Real time reverse transcriptase PCR positive for COVID-19 virus 08/30/2018   Xerosis cutis 07/26/2018   Restless leg syndrome 05/19/2018   Chronic generalized pain 05/19/2018   Hypotension 08/31/2016   Postural dizziness with presyncope 08/31/2016   Paroxysmal atrial fibrillation ([place]) 08/31/2016   Depression 08/31/2016   Vitamin D deficiency 08/31/2016   Mood disorder ([place]) 08/22/2016   Elevated amylase and lipase 02/13/2016   Dementia with behavioral disturbance ([place]) 02/13/2016   Overactive bladder 02/13/2016   Chronic kidney disease    COPD (chronic obstructive pulmonary disease) (HCC)    Anemia    Anxiety    Barrett's esophagus    Hypothyroidism    GERD (gastroesophageal reflux disease)    Osteoporosis    Psoriasis (a type of skin inflammation)  PAST MEDICAL HISTORY:  Active Ambulatory Problems    Diagnosis Date Noted   Chronic kidney disease    COPD (chronic obstructive pulmonary disease) ([place])    Anemia    Anxiety    Barrett's esophagus    Hypothyroidism    GERD (gastroesophageal reflux disease)    Osteoporosis    Psoriasis  (a type of skin inflammation)    Elevated amylase and lipase 02/13/2016   Dementia with behavioral disturbance ([place]) 02/13/2016   Overactive bladder 02/13/2016   Mood disorder ([place]) 08/22/2016   Hypotension 08/31/2016   Postural dizziness with presyncope 08/31/2016   Paroxysmal atrial fibrillation ([place]) 08/31/2016   Depression 08/31/2016   Vitamin D deficiency 08/31/2016   Restless leg syndrome 05/19/2018   Chronic generalized pain 05/19/2018   Xerosis cutis 07/26/2018   Real time reverse transcriptase PCR positive for COVID-19 virus 08/30/2018   Allergic rhinitis 02/07/2019   Dry eye syndrome 03/06/2019   Resolved Ambulatory Problems    Diagnosis Date Noted   Hypertension    Memory difficulties    Acute encephalopathy 02/13/2016   Psychosis in elderly ([place]) 02/13/2016   Necrotizing pneumonia ([place]) 05/10/2016   COPD exacerbation ([place]) 05/10/2016   UTI (urinary tract infection) 05/10/2016   Paroxysmal atrial fibrillation with RVR ([place]) A999333   Metabolic encephalopathy A999333   Chest pain 08/31/2016   Hypokalemia 08/31/2016   Bilateral pneumonia 07/26/2018   Rhonchi 07/26/2018   Dehydration 08/30/2018   Hospice care patient 09/17/2018   MRSA bacteremia 09/21/2018   Past Medical History:  Diagnosis Date   Arthritis    Asthma    Disease of thyroid gland    Diverticulitis    Hyperlipemia    Pancreatitis    Vertigo    SOCIAL HX:  Social History   Tobacco Use   Smoking status: Former    Types: Cigarettes    Quit date: 12/17/1990    Years since quitting: 31.1   Smokeless tobacco: Never  Substance Use Topics   Alcohol use: No   FAMILY HX:  Family History  Problem Relation Age of Onset   Arthritis Mother    Heart disease Mother    Hypertension Mother    Cancer Father    Arthritis Father    Heart disease Father    Alzheimer's disease Sister    Arthritis Sister    Heart disease Sister    Memory loss Sister    Cancer Sister    Heart attack Sister     Cancer Brother    Lung disease Brother    Heart disease Brother       ALLERGIES:  Allergies  Allergen Reactions   Azithromycin Other (See Comments)    unknown   Cephalexin Other (See Comments)    unknown   Cephalosporins    Ciprofloxacin Other (See Comments)    unknown   Darvon [Propoxyphene]    Flagyl [Metronidazole] Other (See Comments)    unknown   Librax [Chlordiazepoxide-Clidinium] Other (See Comments)    unknown   Macrodantin [Nitrofurantoin Macrocrystal]    Nitrofuran Derivatives Other (See Comments)    unknown   Other Other (See Comments)    unknown   Oxycodone-Acetaminophen Other (See Comments)    unknown   Percocet [Oxycodone-Acetaminophen]    Quetiapine Other (See Comments)    unknown Potassium level dropped.   Sulfa Antibiotics    Sulfamethoxazole Other (See Comments)    unknown      I spent 35 minutes providing this consultation; this includes time spent  with patient/family, chart review and documentation. More than 50% of the time in this consultation was spent on counseling and coordinating communication.   Thank you for the opportunity to participate in the care of Ms. Compere.  The palliative care team will continue to follow. Please call our office at [phone] if we can be of additional assistance.   Teodoro Spray, NP

## 2022-04-19 ENCOUNTER — Non-Acute Institutional Stay: Payer: Medicare Other | Admitting: Hospice

## 2022-04-19 DIAGNOSIS — Z515 Encounter for palliative care: Secondary | ICD-10-CM

## 2022-04-19 DIAGNOSIS — R1312 Dysphagia, oropharyngeal phase: Secondary | ICD-10-CM

## 2022-04-19 DIAGNOSIS — J449 Chronic obstructive pulmonary disease, unspecified: Secondary | ICD-10-CM

## 2022-04-19 DIAGNOSIS — F0390 Unspecified dementia without behavioral disturbance: Secondary | ICD-10-CM

## 2022-04-19 DIAGNOSIS — F339 Major depressive disorder, recurrent, unspecified: Secondary | ICD-10-CM

## 2022-04-19 NOTE — Progress Notes (Signed)
[facility] Palliative Care Consult Note Telephone: [phone]  Fax: [phone]   Date of encounter: 04/19/22 4:37 PM PATIENT NAME: Rachel Dickerson [place] [address]   [phone] (home)  DOB: 1940/08/29 MRN: [id number] PRIMARY CARE PROVIDER:    Elmore Guise, [facility] [address] [place] [address] [address] [phone]  REFERRING PROVIDER:   Martie Lee, NP with [facility].    RESPONSIBLE PARTY:   Tammy Contact Information     Name Relation Home Work Mobile   Hopkins,Tammy Niece [phone]     Gara Kroner Niece [phone]     Kerby Nora Other   [id number]        I met face to face with patient and family in [facility] facility. Palliative Care was asked to follow this patient by consultation request to address advance care planning and complex medical decision making.                                     ASSESSMENT AND PLAN / RECOMMENDATIONS:   CODE STATUS:  DNR  Symptom Management/Plan: Dementia: Memory loss/confusion at baseline. Continue donepezil and ongoing supportive care.  Provide redirection, cueing fall/safety precautions. Continue Donepezil.  Continue ongoing supportive care.  Encourage socialization and participation in facility activities. FAST 7A, FLACC 0.  Depression: Improving.  Continue Effexor. Encourage socialization. Pysch consult as needed.  Oropharyngeal dysphagia: ST consult as needed.  Mechanical soft diet, downgrade if needed.  No report of aspiration since last visit continue Aspiration precautions. COPD: No exacerbation.  Continue Symbicort and albuterol as ordered.  Encourage slow deep breathing.  Avoid triggers.  Follow-up with as needed/as planned  Follow up Palliative Care Visit: Palliative care will continue to follow for complex medical decision making, advance care planning, and clarification of goals. Return in 6 weeks/as needed  HOSPICE  ELIGIBILITY/DIAGNOSIS: TBA.  Family reports family will be open to hospice service in the future.  Chief Complaint: Follow-up visit  HISTORY OF PRESENT ILLNESS:  Rachel Dickerson is a 82 y.o. year old female  with multiple medical comorbidities requiring close monitoring, with high risk of mortality: Dementia, oropharyngeal dysphagia.history of COPD, afib, osteoporosis, psoriasis, barrett's esophagus, gerd, hypothyroidism, overactive bladder, D deficiency, CKD, restless legs, dry eyes, depression, anxiety seen in palliative care consult.  Patient in no acute distress, denies pain/discomfort.  Nursing with no concerns today.  No hospitalization since last visit History obtained from review of EMR, discussion with primary team, and interview with family, facility staff/caregiver and/or Rachel Dickerson.  I reviewed available labs, medications, imaging, studies and related documents from the EMR.  Records reviewed and summarized above.    CURRENT PROBLEM LIST:  Patient Active Problem List   Diagnosis Date Noted   Dry eye syndrome 03/06/2019   Allergic rhinitis 02/07/2019   Real time reverse transcriptase PCR positive for COVID-19 virus 08/30/2018   Xerosis cutis 07/26/2018   Restless leg syndrome 05/19/2018   Chronic generalized pain 05/19/2018   Hypotension 08/31/2016   Postural dizziness with presyncope 08/31/2016   Paroxysmal atrial fibrillation ([place]) 08/31/2016   Depression 08/31/2016   Vitamin D deficiency 08/31/2016   Mood disorder ([place]) 08/22/2016   Elevated amylase and lipase 02/13/2016   Dementia with behavioral disturbance ([place]) 02/13/2016   Overactive bladder 02/13/2016   Chronic kidney disease    COPD (chronic obstructive pulmonary disease) ([place])  Anemia    Anxiety    Barrett's esophagus    Hypothyroidism    GERD (gastroesophageal reflux disease)    Osteoporosis    Psoriasis (a type of skin inflammation)    PAST MEDICAL HISTORY:  Active Ambulatory Problems    Diagnosis  Date Noted   Chronic kidney disease    COPD (chronic obstructive pulmonary disease) ([place])    Anemia    Anxiety    Barrett's esophagus    Hypothyroidism    GERD (gastroesophageal reflux disease)    Osteoporosis    Psoriasis (a type of skin inflammation)    Elevated amylase and lipase 02/13/2016   Dementia with behavioral disturbance ([place]) 02/13/2016   Overactive bladder 02/13/2016   Mood disorder ([place]) 08/22/2016   Hypotension 08/31/2016   Postural dizziness with presyncope 08/31/2016   Paroxysmal atrial fibrillation ([place]) 08/31/2016   Depression 08/31/2016   Vitamin D deficiency 08/31/2016   Restless leg syndrome 05/19/2018   Chronic generalized pain 05/19/2018   Xerosis cutis 07/26/2018   Real time reverse transcriptase PCR positive for COVID-19 virus 08/30/2018   Allergic rhinitis 02/07/2019   Dry eye syndrome 03/06/2019   Resolved Ambulatory Problems    Diagnosis Date Noted   Hypertension    Memory difficulties    Acute encephalopathy 02/13/2016   Psychosis in elderly ([place]) 02/13/2016   Necrotizing pneumonia ([place]) 05/10/2016   COPD exacerbation ([place]) 05/10/2016   UTI (urinary tract infection) 05/10/2016   Paroxysmal atrial fibrillation with RVR ([place]) A999333   Metabolic encephalopathy A999333   Chest pain 08/31/2016   Hypokalemia 08/31/2016   Bilateral pneumonia 07/26/2018   Rhonchi 07/26/2018   Dehydration 08/30/2018   Hospice care patient 09/17/2018   MRSA bacteremia 09/21/2018   Past Medical History:  Diagnosis Date   Arthritis    Asthma    Disease of thyroid gland    Diverticulitis    Hyperlipemia    Pancreatitis    Vertigo    SOCIAL HX:  Social History   Tobacco Use   Smoking status: Former    Types: Cigarettes    Quit date: 12/17/1990    Years since quitting: 31.3   Smokeless tobacco: Never  Substance Use Topics   Alcohol use: No   FAMILY HX:  Family History  Problem Relation Age of Onset   Arthritis Mother    Heart disease Mother     Hypertension Mother    Cancer Father    Arthritis Father    Heart disease Father    Alzheimer's disease Sister    Arthritis Sister    Heart disease Sister    Memory loss Sister    Cancer Sister    Heart attack Sister    Cancer Brother    Lung disease Brother    Heart disease Brother       ALLERGIES:  Allergies  Allergen Reactions   Azithromycin Other (See Comments)    unknown   Cephalexin Other (See Comments)    unknown   Cephalosporins    Ciprofloxacin Other (See Comments)    unknown   Darvon [Propoxyphene]    Flagyl [Metronidazole] Other (See Comments)    unknown   Librax [Chlordiazepoxide-Clidinium] Other (See Comments)    unknown   Macrodantin [Nitrofurantoin Macrocrystal]    Nitrofuran Derivatives Other (See Comments)    unknown   Other Other (See Comments)    unknown   Oxycodone-Acetaminophen Other (See Comments)    unknown   Percocet [Oxycodone-Acetaminophen]    Quetiapine Other (See Comments)  unknown Potassium level dropped.   Sulfa Antibiotics    Sulfamethoxazole Other (See Comments)    unknown      I spent 35 minutes providing this consultation; this includes time spent with patient/family, chart review and documentation. More than 50% of the time in this consultation was spent on counseling and coordinating communication.   Thank you for the opportunity to participate in the care of Ms. Yim.  The palliative care team will continue to follow. Please call our office at [phone] if we can be of additional assistance.   Teodoro Spray, NP

## 2022-05-20 ENCOUNTER — Non-Acute Institutional Stay: Payer: Medicare Other | Admitting: Hospice

## 2022-05-20 DIAGNOSIS — F339 Major depressive disorder, recurrent, unspecified: Secondary | ICD-10-CM

## 2022-05-20 DIAGNOSIS — Z515 Encounter for palliative care: Secondary | ICD-10-CM

## 2022-05-20 DIAGNOSIS — J449 Chronic obstructive pulmonary disease, unspecified: Secondary | ICD-10-CM

## 2022-05-20 DIAGNOSIS — R1312 Dysphagia, oropharyngeal phase: Secondary | ICD-10-CM

## 2022-05-20 DIAGNOSIS — F0390 Unspecified dementia without behavioral disturbance: Secondary | ICD-10-CM

## 2022-05-20 NOTE — Progress Notes (Signed)
[facility] Palliative Care Consult Note Telephone: [phone]  Fax: [phone]   Date of encounter: 05/20/22 11:10 AM PATIENT NAME: Rachel Dickerson [address] [place] [address]   [phone] (home)  DOB: 08-31-1940 MRN: [id number] PRIMARY CARE PROVIDER:    Marinda Elk, MD [address]  [place] [address] [phone]  REFERRING PROVIDER:   Wynell Balloon, NP with [facility].    RESPONSIBLE PARTY:   Tammy Contact Information     Name Relation Home Work Mobile   Hopkins,Tammy Niece [phone]     Mirian Capuchin Niece [phone]     Eduard Clos Other   [phone]        I met face to face with patient and family in [facility] facility. Palliative Care was asked to follow this patient by consultation request to address advance care planning and complex medical decision making.                                     ASSESSMENT AND PLAN / RECOMMENDATIONS:   CODE STATUS:  DNR  Symptom Management/Plan: Dementia:  Continue to monitor for behavior changes, memory loss/confusion. Continue to provide redirection, cueing fall/safety precautions. Continue Donepezil.  Continue ongoing supportive care.  Encourage socialization and participation in facility activities. FAST 7A, FLACC 0.  Depression: Continue Effexor. Encourage socialization. Pysch consult as needed.  Oropharyngeal dysphagia: Continue Mechanical soft diet, downgrade if needed.  No report of aspiration since last visit continue Aspiration precautions. ST consult as needed.   COPD: Stable with no recent exacerbation.  Continue Symbicort and albuterol as ordered.  Encourage slow deep breathing.  Avoid triggers.  Follow-up with as needed/as planned  Follow up Palliative Care Visit: Palliative care will continue to follow for complex medical decision making, advance care planning, and clarification of goals. Return in 6 weeks/as needed  HOSPICE  ELIGIBILITY/DIAGNOSIS: TBA.  Family reports family will be open to hospice service in the future.  Chief Complaint: Follow-up visit  HISTORY OF PRESENT ILLNESS:  Rachel Dickerson is a 82 y.o. year old female  with multiple medical comorbidities requiring close monitoring, with high risk of mortality: Dementia, oropharyngeal dysphagia.history of COPD, afib, osteoporosis, psoriasis, barrett's esophagus, gerd, hypothyroidism, overactive bladder, D deficiency, CKD, restless legs, dry eyes, depression, anxiety seen in palliative care consult.  Patient in no acute distress, sitting up in her wheelchair watching TV, cooperative, denies pain/discomfort, denies moodiness.  Nursing with no concerns today.  No hospitalization since last visit History obtained from review of EMR, discussion with primary team, and interview with family, facility staff/caregiver and/or [name].  I reviewed available labs, medications, imaging, studies and related documents from the EMR.  Records reviewed and summarized above.    CURRENT PROBLEM LIST:  Patient Active Problem List   Diagnosis Date Noted   Dry eye syndrome 03/06/2019   Allergic rhinitis 02/07/2019   Real time reverse transcriptase PCR positive for COVID-19 virus 08/30/2018   Xerosis cutis 07/26/2018   Restless leg syndrome 05/19/2018   Chronic generalized pain 05/19/2018   Hypotension 08/31/2016   Postural dizziness with presyncope 08/31/2016   Paroxysmal atrial fibrillation 08/31/2016   Depression 08/31/2016   Vitamin D deficiency 08/31/2016   Mood disorder 08/22/2016   Elevated amylase and lipase 02/13/2016   Dementia with behavioral disturbance 02/13/2016   Overactive bladder 02/13/2016   Chronic kidney disease  COPD (chronic obstructive pulmonary disease)    Anemia    Anxiety    Barrett's esophagus    Hypothyroidism    GERD (gastroesophageal reflux disease)    Osteoporosis    Psoriasis (a type of skin inflammation)    PAST MEDICAL  HISTORY:  Active Ambulatory Problems    Diagnosis Date Noted   Chronic kidney disease    COPD (chronic obstructive pulmonary disease)    Anemia    Anxiety    Barrett's esophagus    Hypothyroidism    GERD (gastroesophageal reflux disease)    Osteoporosis    Psoriasis (a type of skin inflammation)    Elevated amylase and lipase 02/13/2016   Dementia with behavioral disturbance 02/13/2016   Overactive bladder 02/13/2016   Mood disorder 08/22/2016   Hypotension 08/31/2016   Postural dizziness with presyncope 08/31/2016   Paroxysmal atrial fibrillation 08/31/2016   Depression 08/31/2016   Vitamin D deficiency 08/31/2016   Restless leg syndrome 05/19/2018   Chronic generalized pain 05/19/2018   Xerosis cutis 07/26/2018   Real time reverse transcriptase PCR positive for COVID-19 virus 08/30/2018   Allergic rhinitis 02/07/2019   Dry eye syndrome 03/06/2019   Resolved Ambulatory Problems    Diagnosis Date Noted   Hypertension    Memory difficulties    Acute encephalopathy 02/13/2016   Psychosis in elderly 02/13/2016   Necrotizing pneumonia 05/10/2016   COPD exacerbation 05/10/2016   UTI (urinary tract infection) 05/10/2016   Paroxysmal atrial fibrillation with RVR 05/10/2016   Metabolic encephalopathy 05/10/2016   Chest pain 08/31/2016   Hypokalemia 08/31/2016   Bilateral pneumonia 07/26/2018   Rhonchi 07/26/2018   Dehydration 08/30/2018   Hospice care patient 09/17/2018   MRSA bacteremia 09/21/2018   Past Medical History:  Diagnosis Date   Arthritis    Asthma    Disease of thyroid gland    Diverticulitis    Hyperlipemia    Pancreatitis    Vertigo    SOCIAL HX:  Social History   Tobacco Use   Smoking status: Former    Types: Cigarettes    Quit date: 12/17/1990    Years since quitting: 31.4   Smokeless tobacco: Never  Substance Use Topics   Alcohol use: No   FAMILY HX:  Family History  Problem Relation Age of Onset   Arthritis Mother    Heart disease  Mother    Hypertension Mother    Cancer Father    Arthritis Father    Heart disease Father    Alzheimer's disease Sister    Arthritis Sister    Heart disease Sister    Memory loss Sister    Cancer Sister    Heart attack Sister    Cancer Brother    Lung disease Brother    Heart disease Brother       ALLERGIES:  Allergies  Allergen Reactions   Azithromycin Other (See Comments)    unknown   Cephalexin Other (See Comments)    unknown   Cephalosporins    Ciprofloxacin Other (See Comments)    unknown   Darvon [Propoxyphene]    Flagyl [Metronidazole] Other (See Comments)    unknown   Librax [Chlordiazepoxide-Clidinium] Other (See Comments)    unknown   Macrodantin [Nitrofurantoin Macrocrystal]    Nitrofuran Derivatives Other (See Comments)    unknown   Other Other (See Comments)    unknown   Oxycodone-Acetaminophen Other (See Comments)    unknown   Percocet [Oxycodone-Acetaminophen]    Quetiapine Other (See Comments)  unknown Potassium level dropped.   Sulfa Antibiotics    Sulfamethoxazole Other (See Comments)    unknown      I spent 35 minutes providing this consultation; this includes time spent with patient/family, chart review and documentation. More than 50% of the time in this consultation was spent on counseling and coordinating communication.   Thank you for the opportunity to participate in the care of Rachel Dickerson.  The palliative care team will continue to follow. Please call our office at [phone] if we can be of additional assistance.   Teodoro Spray, NP

## 2022-06-24 ENCOUNTER — Non-Acute Institutional Stay: Payer: Medicare Other | Admitting: Hospice

## 2022-06-24 DIAGNOSIS — R1312 Dysphagia, oropharyngeal phase: Secondary | ICD-10-CM

## 2022-06-24 DIAGNOSIS — Z515 Encounter for palliative care: Secondary | ICD-10-CM

## 2022-06-24 DIAGNOSIS — J449 Chronic obstructive pulmonary disease, unspecified: Secondary | ICD-10-CM

## 2022-06-24 DIAGNOSIS — F339 Major depressive disorder, recurrent, unspecified: Secondary | ICD-10-CM

## 2022-06-24 DIAGNOSIS — F0390 Unspecified dementia without behavioral disturbance: Secondary | ICD-10-CM

## 2022-06-24 NOTE — Progress Notes (Signed)
Therapist, nutritional Palliative Care Consult Note Telephone: [phone]  Fax: [phone]   Date of encounter: 06/24/22 11:16 AM PATIENT NAME: Rachel Dickerson [address] [place] [address]   [phone] (home)  DOB: 10-01-1940 MRN: [id number] PRIMARY CARE PROVIDER:    Marinda Elk, MD [address]  [place] [address] [phone]  REFERRING PROVIDER:   Wynell Balloon, NP with [employer].    RESPONSIBLE PARTY:   Tammy Contact Information     Name Relation Home Work Mobile   Hopkins,Tammy Niece [phone]     Mirian Capuchin Niece [phone]     Eduard Clos Other   [phone]        I met face to face with patient and family in [facility] facility. Palliative Care was asked to follow this patient by consultation request to address advance care planning and complex medical decision making.                                     ASSESSMENT AND PLAN / RECOMMENDATIONS:   CODE STATUS:  DNR  Symptom Management/Plan: Dementia: Memory loss/confusion at baseline.   Continue to provide redirection, cueing fall/safety precautions.  Continue Donepezil.  Continue ongoing supportive care.  Encourage socialization and participation in facility activities. FAST 7A, FLACC 0.   Depression: Continue Effexor. Encourage socialization. Pysch consult as needed.   Oropharyngeal dysphagia: No report of aspiration.   Continue aspiration precautions.   Continue Mechanical soft diet, downgrade if needed.   ST consult as needed.    COPD: Stable with no recent exacerbation.  Continue Symbicort and albuterol as ordered.  Encourage slow deep breathing.  Avoid triggers.  Follow-up with as needed/as planned  Follow up Palliative Care Visit: Palliative care will continue to follow for complex medical decision making, advance care planning, and clarification of goals. Return in 6 weeks/as needed  HOSPICE ELIGIBILITY/DIAGNOSIS: TBA.  Family  reports family will be open to hospice service in the future.  Chief Complaint: Follow-up visit  HISTORY OF PRESENT ILLNESS:  Rachel Dickerson is a 82 y.o. year old female  with multiple medical comorbidities requiring close monitoring, with high risk of mortality: Dementia, oropharyngeal dysphagia.history of COPD, afib, osteoporosis, psoriasis, barrett's esophagus, gerd, hypothyroidism, overactive bladder, D deficiency, CKD, restless legs, dry eyes, depression, anxiety seen in palliative care consult.  Patient in no acute distress,  cooperative, denies pain/discomfort, denies moodiness.  Nursing with no concerns today.  No hospitalization since last visit History obtained from review of EMR, discussion with primary team, and interview with family, facility staff/caregiver and/or Ms. Carsey.  I reviewed available labs, medications, imaging, studies and related documents from the EMR.  Records reviewed and summarized above.    CURRENT PROBLEM LIST:  Patient Active Problem List   Diagnosis Date Noted   Dry eye syndrome 03/06/2019   Allergic rhinitis 02/07/2019   Real time reverse transcriptase PCR positive for COVID-19 virus 08/30/2018   Xerosis cutis 07/26/2018   Restless leg syndrome 05/19/2018   Chronic generalized pain 05/19/2018   Hypotension 08/31/2016   Postural dizziness with presyncope 08/31/2016   Paroxysmal atrial fibrillation (HCC) 08/31/2016   Depression 08/31/2016   Vitamin D deficiency 08/31/2016   Mood disorder (HCC) 08/22/2016   Elevated amylase and lipase 02/13/2016   Dementia with behavioral disturbance (HCC) 02/13/2016   Overactive bladder 02/13/2016   Chronic kidney disease  COPD (chronic obstructive pulmonary disease) (HCC)    Anemia    Anxiety    Barrett's esophagus    Hypothyroidism    GERD (gastroesophageal reflux disease)    Osteoporosis    Psoriasis (a type of skin inflammation)    PAST MEDICAL HISTORY:  Active Ambulatory Problems    Diagnosis  Date Noted   Chronic kidney disease    COPD (chronic obstructive pulmonary disease) (HCC)    Anemia    Anxiety    Barrett's esophagus    Hypothyroidism    GERD (gastroesophageal reflux disease)    Osteoporosis    Psoriasis (a type of skin inflammation)    Elevated amylase and lipase 02/13/2016   Dementia with behavioral disturbance (HCC) 02/13/2016   Overactive bladder 02/13/2016   Mood disorder (HCC) 08/22/2016   Hypotension 08/31/2016   Postural dizziness with presyncope 08/31/2016   Paroxysmal atrial fibrillation (HCC) 08/31/2016   Depression 08/31/2016   Vitamin D deficiency 08/31/2016   Restless leg syndrome 05/19/2018   Chronic generalized pain 05/19/2018   Xerosis cutis 07/26/2018   Real time reverse transcriptase PCR positive for COVID-19 virus 08/30/2018   Allergic rhinitis 02/07/2019   Dry eye syndrome 03/06/2019   Resolved Ambulatory Problems    Diagnosis Date Noted   Hypertension    Memory difficulties    Acute encephalopathy 02/13/2016   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) 02/13/2016   Necrotizing pneumonia (HCC) 05/10/2016   COPD exacerbation (HCC) 05/10/2016   UTI (urinary tract infection) 05/10/2016   Paroxysmal atrial fibrillation with RVR (HCC) 05/10/2016   Metabolic encephalopathy 05/10/2016   Chest pain 08/31/2016   Hypokalemia 08/31/2016   Bilateral pneumonia 07/26/2018   Rhonchi 07/26/2018   Dehydration 08/30/2018   Hospice care patient 09/17/2018   MRSA bacteremia 09/21/2018   Past Medical History:  Diagnosis Date   Arthritis    Asthma    Disease of thyroid gland    Diverticulitis    Hyperlipemia    Pancreatitis    Vertigo    SOCIAL HX:  Social History   Tobacco Use   Smoking status: Former    Types: Cigarettes    Quit date: 12/17/1990    Years since quitting: 31.5   Smokeless tobacco: Never  Substance Use Topics   Alcohol use: No   FAMILY HX:  Family History  Problem Relation Age of Onset   Arthritis Mother    Heart disease Mother     Hypertension Mother    Cancer Father    Arthritis Father    Heart disease Father    Alzheimer's disease Sister    Arthritis Sister    Heart disease Sister    Memory loss Sister    Cancer Sister    Heart attack Sister    Cancer Brother    Lung disease Brother    Heart disease Brother       ALLERGIES:  Allergies  Allergen Reactions   Azithromycin Other (See Comments)    unknown   Cephalexin Other (See Comments)    unknown   Cephalosporins    Ciprofloxacin Other (See Comments)    unknown   Darvon [Propoxyphene]    Flagyl [Metronidazole] Other (See Comments)    unknown   Librax [Chlordiazepoxide-Clidinium] Other (See Comments)    unknown   Macrodantin [Nitrofurantoin Macrocrystal]    Nitrofuran Derivatives Other (See Comments)    unknown   Other Other (See Comments)    unknown   Oxycodone-Acetaminophen Other (See Comments)    unknown   Percocet [  Oxycodone-Acetaminophen]    Quetiapine Other (See Comments)    unknown Potassium level dropped.   Sulfa Antibiotics    Sulfamethoxazole Other (See Comments)    unknown      I spent 35 minutes providing this consultation; this includes time spent with patient/family, chart review and documentation. More than 50% of the time in this consultation was spent on counseling and coordinating communication.   Thank you for the opportunity to participate in the care of Ms. Lefeber.  The palliative care team will continue to follow. Please call our office at [phone] if we can be of additional assistance.   Rosaura Carpenter, NP

## 2023-01-17 ENCOUNTER — Emergency Department (HOSPITAL_BASED_OUTPATIENT_CLINIC_OR_DEPARTMENT_OTHER)
Admission: EM | Admit: 2023-01-17 | Discharge: 2023-01-17 | Disposition: A | Discharge status: Home / Self Care | Payer: Medicare Other | Attending: Emergency Medicine | Admitting: Emergency Medicine

## 2023-01-17 ENCOUNTER — Emergency Department (HOSPITAL_BASED_OUTPATIENT_CLINIC_OR_DEPARTMENT_OTHER): Payer: Medicare Other

## 2023-01-17 ENCOUNTER — Encounter (HOSPITAL_BASED_OUTPATIENT_CLINIC_OR_DEPARTMENT_OTHER): Payer: Self-pay | Admitting: Emergency Medicine

## 2023-01-17 ENCOUNTER — Other Ambulatory Visit: Payer: Self-pay

## 2023-01-17 DIAGNOSIS — L03011 Cellulitis of right finger: Secondary | ICD-10-CM | POA: Insufficient documentation

## 2023-01-17 DIAGNOSIS — Z7982 Long term (current) use of aspirin: Secondary | ICD-10-CM | POA: Diagnosis not present

## 2023-01-17 DIAGNOSIS — F0390 Unspecified dementia without behavioral disturbance: Secondary | ICD-10-CM | POA: Diagnosis not present

## 2023-01-17 DIAGNOSIS — R2231 Localized swelling, mass and lump, right upper limb: Secondary | ICD-10-CM | POA: Diagnosis present

## 2023-01-17 MED ORDER — ACETAMINOPHEN 325 MG PO TABS: 650.0000 mg | ORAL_TABLET | Freq: Once | ORAL | Status: AC

## 2023-01-17 MED ORDER — ACETAMINOPHEN 325 MG PO TABS
ORAL_TABLET | ORAL | Status: AC
  Administered 2023-01-17: 650 mg via ORAL
  Filled 2023-01-17: qty 2

## 2023-01-17 MED ORDER — BACITRACIN ZINC 500 UNIT/GM EX OINT
TOPICAL_OINTMENT | Freq: Two times a day (BID) | CUTANEOUS | Status: DC
  Administered 2023-01-17: 31.5 via TOPICAL
  Filled 2023-01-17: qty 28.35

## 2023-01-17 NOTE — Discharge Instructions (Signed)
Continue warm water soaks with Dial soap twice daily with gentle massage to the finger to try to drain some more fluid if possible.  Use bacitracin ointment twice daily over this area as well and continue your oral doxycycline 100 mg twice a day.  Have wound rechecked in 48 hours.  Please return if redness starts to go circumferentially around your hand and forearm

## 2023-01-17 NOTE — ED Triage Notes (Signed)
Patient from nursing home accompanied by family. Right index finger discoloration and swelling ongoing for an unknown amount of time. Family was sent a photo of the finger today and staff unsure how long sx have been present. Facility started patient on doxycycline today. Patient has dementia.

## 2023-01-17 NOTE — ED Provider Notes (Signed)
[place] [facility] Provider Note   CSN: [id number] Arrival date & time: 01/17/23  1835     History  Chief Complaint  Patient presents with   Finger Injury    Rachel Dickerson is a 82 y.o. female.  Patient here with swelling to the right index finger.  She was started on doxycycline today.  Family took her here for evaluation.  She has a history of dementia.  She is not a diabetic.  There is no known obvious trauma.  No fever.  She has been on her baseline otherwise.  The history is provided by the patient.       Home Medications Prior to Admission medications   Medication Sig Start Date End Date Taking? Authorizing Provider  acetaminophen (TYLENOL) 325 MG tablet Take 650 mg by mouth every 8 (eight) hours as needed.     [provider]  albuterol (VENTOLIN HFA) 108 (90 Base) MCG/ACT inhaler Inhale 2 puffs into the lungs every 6 (six) hours as needed for wheezing or shortness of breath.    [provider]  aspirin 81 MG chewable tablet Chew 81 mg by mouth daily.    [provider]  benzocaine (ORAJEL) 10 % mucosal gel Use as directed 1 application in the mouth or throat in the morning, at noon, in the evening, and at bedtime. Apply to left lower gum QID prn for pain    [provider]  budesonide-formoterol (SYMBICORT) 160-4.5 MCG/ACT inhaler Inhale 2 puffs into the lungs 2 (two) times daily.    [provider]  Cholecalciferol 1.25 MG (50000 UT) TABS Take 1 tablet by mouth every 30 (thirty) days. On the 1st of the month    [provider]  donepezil (ARICEPT) 10 MG tablet Take 10 mg by mouth at bedtime.    [provider]  famotidine (PEPCID) 20 MG tablet Take 20 mg by mouth at bedtime.     [provider]  fluticasone (FLONASE) 50 MCG/ACT nasal spray Place 2 sprays into both nostrils daily as needed for allergies.     [provider]  guaiFENesin (ROBITUSSIN)  100 MG/5ML liquid Take 400 mg by mouth every 6 (six) hours as needed for cough.     [provider]  HYDROcodone-acetaminophen (NORCO/VICODIN) 5-325 MG tablet Take 1 tablet by mouth every 8 (eight) hours. 04/05/19   [name], MD  levothyroxine (SYNTHROID) 137 MCG tablet Take 137 mcg by mouth daily. 08/28/16   [provider]  levothyroxine (SYNTHROID, LEVOTHROID) 100 MCG tablet Take 100 mcg by mouth daily before breakfast.    [provider]  loratadine (CLARITIN) 10 MG tablet Take 10 mg by mouth daily.    [provider]  magnesium hydroxide (MILK OF MAGNESIA) 400 MG/5ML suspension Take 30 mLs by mouth daily as needed for mild constipation (If no BM in 3 days).    [provider]  Menthol, Topical Analgesic, (BIOFREEZE) 4 % GEL Apply 1 Application topically in the morning, at noon, and at bedtime. Apply to both knees    [provider]  mirabegron ER (MYRBETRIQ) 25 MG TB24 tablet Take 25 mg by mouth daily.    [provider]  potassium chloride SA (K-DUR) 20 MEQ tablet Take 20 mEq by mouth daily.    [provider]  rOPINIRole (REQUIP) 0.5 MG tablet Take 0.25 mg by mouth 2 (two) times daily.    [provider]  senna-docusate (SENOKOT-S) 8.6-50 MG  tablet Take 1 tablet by mouth at bedtime.    [provider]  venlafaxine (EFFEXOR) 37.5 MG tablet Take 37.5 mg by mouth daily.    [provider]      Allergies    Azithromycin, Cephalexin, Cephalosporins, Ciprofloxacin, Flagyl [metronidazole], Librax [chlordiazepoxide-clidinium], Macrodantin [nitrofurantoin macrocrystal], Nitrofuran derivatives, Other, Oxycodone-acetaminophen, Percocet [oxycodone-acetaminophen], Propoxyphene, Quetiapine, Sulfa antibiotics, and Sulfamethoxazole    Review of Systems   Review of Systems  Physical Exam Updated Vital Signs BP 104/70 (BP Location: Right Arm)   Pulse 85   Temp (!) 97.5 F (36.4 C)   Resp 16   Ht  5' (1.524 m)   Wt 53.1 kg   LMP  (LMP Unknown)   SpO2 100%   BMI 22.85 kg/m  Physical Exam Vitals and nursing note reviewed.  Constitutional:      General: She is not in acute distress.    Appearance: She is well-developed.  HENT:     Head: Normocephalic and atraumatic.  Eyes:     Conjunctiva/sclera: Conjunctivae normal.  Cardiovascular:     Rate and Rhythm: Normal rate and regular rhythm.     Pulses: Normal pulses.     Heart sounds: No murmur heard. Pulmonary:     Effort: Pulmonary effort is normal. No respiratory distress.     Breath sounds: Normal breath sounds.  Abdominal:     Palpations: Abdomen is soft.     Tenderness: There is no abdominal tenderness.  Musculoskeletal:        General: No tenderness. Normal range of motion.     Cervical back: Neck supple.     Comments: Patient can flex and extend all fingers of her right hand without any issues, especially her right index finger  Skin:    General: Skin is warm and dry.     Capillary Refill: Capillary refill takes less than 2 seconds.     Comments: Paronychia to the right index finger with fair amount of pus on the dorsal side of the right finger but there is no major cellulitis otherwise  Neurological:     Mental Status: She is alert.     Sensory: No sensory deficit.     Motor: No weakness.  Psychiatric:        Mood and Affect: Mood normal.     ED Results / Procedures / Treatments   Labs (all labs ordered are listed, but only abnormal results are displayed) Labs Reviewed - No data to display  EKG None  Radiology DG Hand Complete Right  Result Date: 01/17/2023 CLINICAL DATA:  Right hand swelling and discoloration EXAM: RIGHT HAND - COMPLETE 3+ VIEW COMPARISON:  None Available. FINDINGS: Frontal, oblique, and lateral views of the right hand are obtained. The bones are osteopenic. There is severe soft tissue swelling throughout the right second digit. No subcutaneous gas or radiopaque foreign body. There are no  acute or destructive bony abnormalities. 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 Mild diffuse osteoarthritis throughout the wrist. IMPRESSION: 1. Significant soft tissue swelling throughout the right second digit. No fracture or radiopaque foreign body. 2. Multifocal osteoarthritis, greatest throughout the interphalangeal joints. Electronically Signed   By: Sharlet Salina M.D.   On: 01/17/2023 19:39    Procedures .Incision and Drainage  Date/Time: 01/17/2023 8:17 PM  Performed by: Virgina Norfolk, DO Authorized by: Virgina Norfolk, DO   Consent:    Consent obtained:  Verbal   Consent given by:  Alen Bleacher  Risks, benefits, and alternatives were discussed: yes     Risks discussed:  Bleeding, damage to other organs, infection, incomplete drainage and pain   Alternatives discussed:  No treatment Universal protocol:    Procedure explained and questions answered to patient or proxy's satisfaction: yes     Relevant documents present and verified: yes     Imaging studies available: yes     Patient identity confirmed:  Arm band Location:    Indications for incision and drainage: right index finger paronychia. Pre-procedure details:    Skin preparation:  Chlorhexidine with alcohol Sedation:    Sedation type:  None Anesthesia:    Anesthesia method:  None Procedure type:    Complexity:  Simple Procedure details:    Incision types:  Stab incision   Drainage:  Purulent   Drainage amount:  Copious   Packing materials:  None Post-procedure details:    Procedure completion:  Tolerated     Medications Ordered in ED Medications  bacitracin ointment (has no administration in time range)    ED Course/ Medical Decision Making/ A&P                                 Medical Decision Making Amount and/or Complexity of Data Reviewed Radiology: ordered.  Risk OTC drugs.   Jeoffrey Massed is here  with swelling to her right index finger.  She has a paronychia to her right index finger.  X-ray was done prior to my evaluation in triage which showed no signs of osteomyelitis or fracture.  Overall I&D was performed of her paronychia and was able to express a lot of pus from this area.  Have no concern for felon or flexor tenosynovitis.  There is little bit of redness that extends down the finger but she can flex and extend without any issues.  Overall I&D was successful.  She is already been on doxycycline which she will continue.  Recommend bacitracin and warm water soaks.  Patient discharged in good condition.  Understands return precautions.  This chart was dictated using voice recognition software.  Despite best efforts to proofread,  errors can occur which can change the documentation meaning.         Final Clinical Impression(s) / ED Diagnoses Final diagnoses:  Paronychia of finger of right hand    Rx / DC Orders ED Discharge Orders     None         Virgina Norfolk, DO 01/17/23 2018

## 2023-01-17 NOTE — ED Notes (Addendum)
Discharge instructions regarding home care of wounds were reviewed. Pt and family were instructed to follow up with PCP and to return with any new or worsening symptoms. Pt was assisted out to family vehicle via wheelchair and into the vehicle.

## 2023-01-17 NOTE — ED Notes (Signed)
Initial contact made. MD at the bedside draining right index finger.

## 2024-01-19 IMAGING — CT CT HEAD W/O CM
3 series · 14 of 47 positions shown, 16 images · non-contrast
Comparison: Head CT 04/29/2016.

CLINICAL DATA: Neck trauma (Age >= 65y); Head trauma, minor (Age >=
65y)



[Series 3: head wo · axial · 0.47mm/px · z∈[+1742,+1887]mm · 8 of 35 slices shown, 10 images]
[im 3/35  brain]
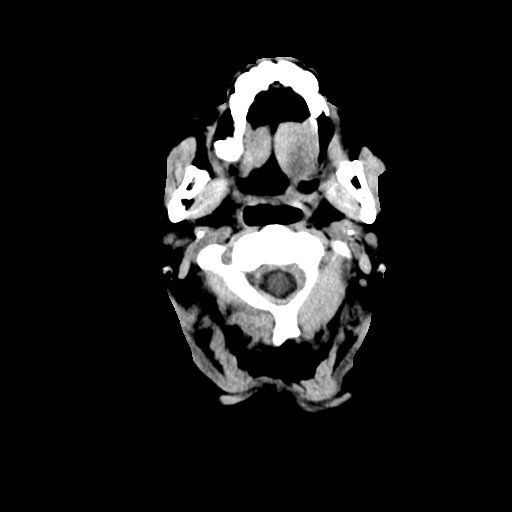
[im 3/35  bone]
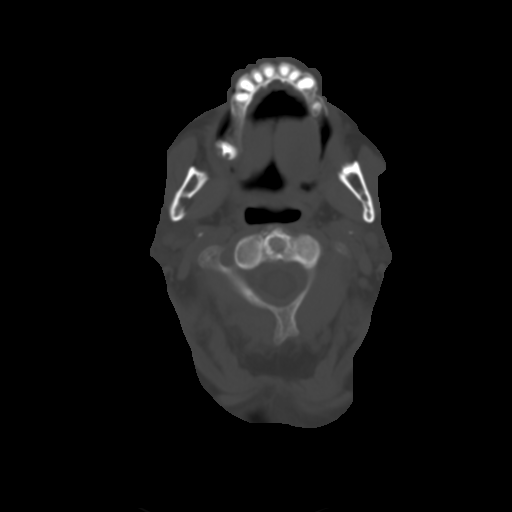
[im 8/35  brain]
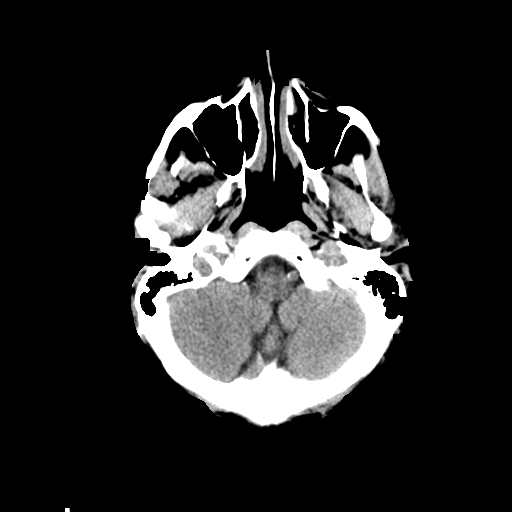
[im 11/35  brain]
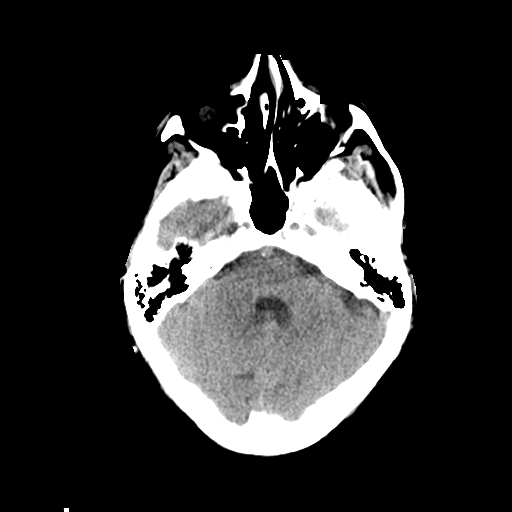
[im 16/35  brain]
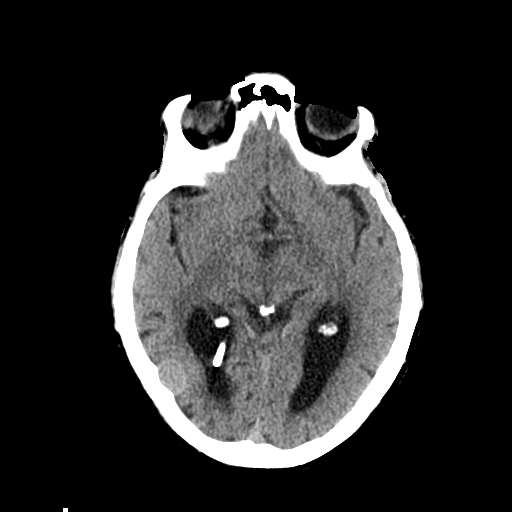
[im 19/35  brain]
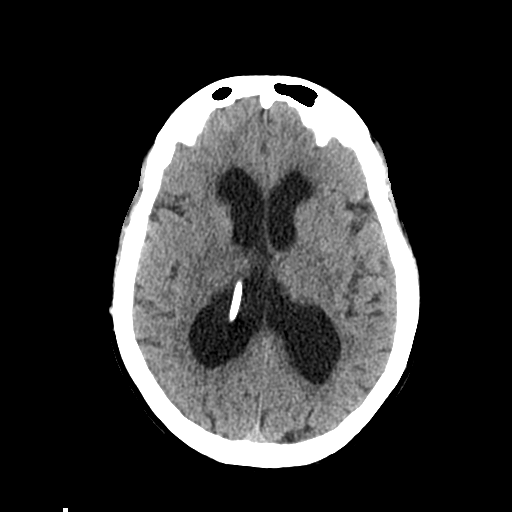
[im 19/35  bone]
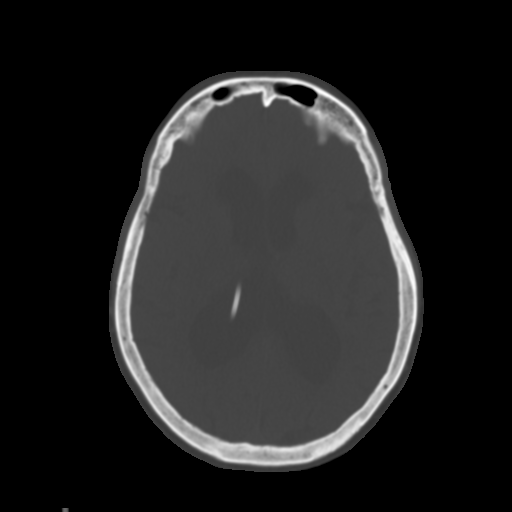
[im 24/35  brain]
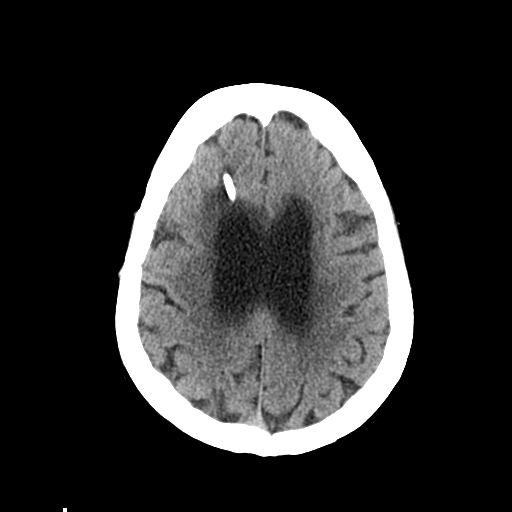
[im 27/35  brain]
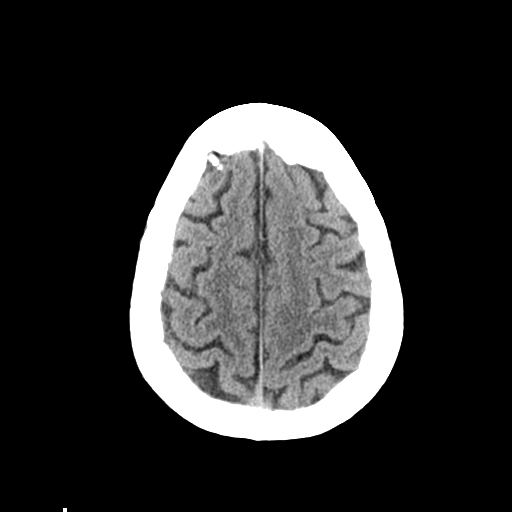
[im 32/35  brain]
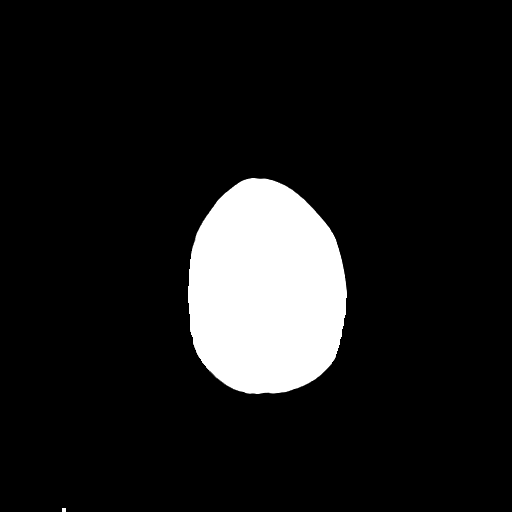

[Series 5: coronal soft tissue · coronal · 0.36mm/px · 3 of 69 slices shown]
[im 23/69  brain]
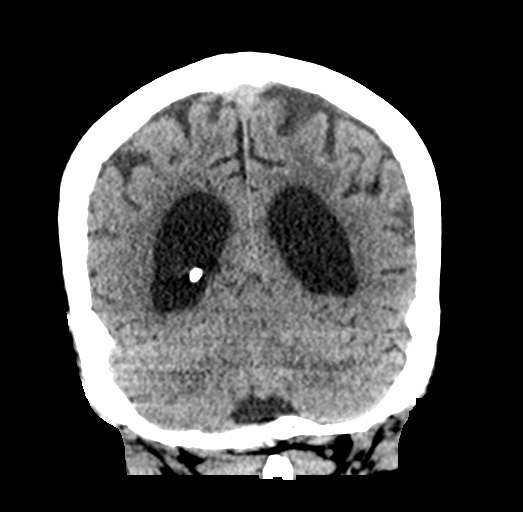
[im 31/69  brain]
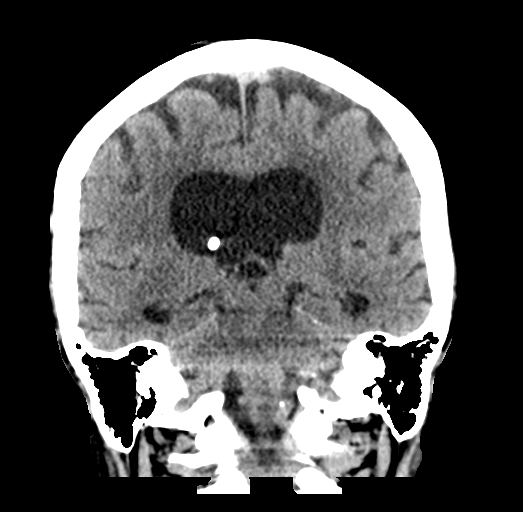
[im 38/69  brain]
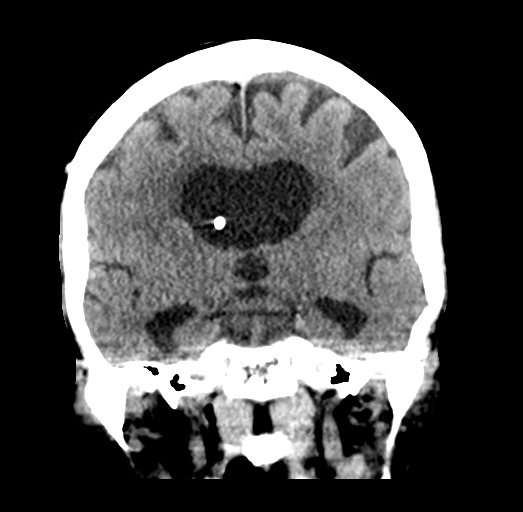

[Series 6: sagittal soft tissue · sagittal · 0.36mm/px · 3 of 62 slices shown]
[im 21/62  brain]
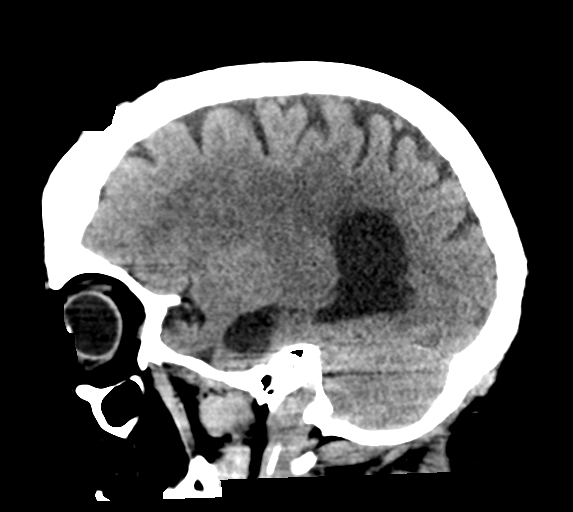
[im 31/62  brain]
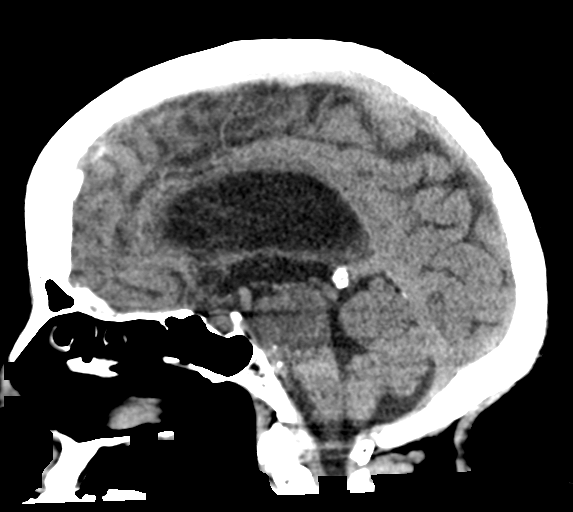
[im 41/62  brain]
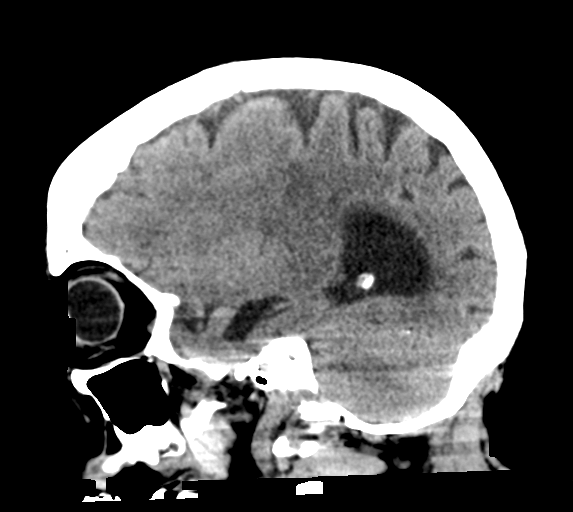

[14 of 47 positions shown; findings below may reference images not displayed]

FINDINGS: CT HEAD FINDINGS

Brain: Right frontal approach ventriculostomy catheter terminates in
the posterior horn of the right lateral ventricle, unchanged.
Hypodensity along the catheter tract in the right frontal lobe
without evidence of tract hemorrhage.The ventricular system is
unchanged in size. For reference, the third ventricle measures
cm, previously 1.3 cm.There is no evidence of acute intracranial
hemorrhage or abnormal extra-axial collection. No concerning mass
effect. There is no new loss of gray-white matter
differentiation.Scattered subcortical and periventricular white
matter hypodensities, nonspecific but likely sequela of chronic
small vessel ischemic disease.Mild cerebral atrophy. Known
extra-axial mass along the posterior right temporal lobe measures
1.6 x 1.2 cm, previously 0.8 x 0.9 cm.

Vascular: Vascular calcifications.  No hyperdense vessel.

Skull: Negative for skull fracture.

Sinuses/Orbits: The paranasal sinuses are predominantly clear.
Mastoid air cells are clear. Orbits are unremarkable.

Other: None.

CT CERVICAL SPINE FINDINGS

Alignment: Grade 1 anterolisthesis at C4-C5 and C5-C6.

Skull base and vertebrae: There is no evidence of acute cervical
spine fracture. There is no aggressive osseous lesion.

Soft tissues and spinal canal: No prevertebral fluid or swelling. No
visible canal hematoma.

Disc levels: There is multilevel degenerative disc disease, moderate
to severe at C3-C4 and C6-C7. There is degenerative grade 1
anterolisthesis at C4-C5 and C5-C6. There are posterior disc
osteophyte complexes, largest at C4-C5 which likely results in a
degree of spinal canal stenosis. There is bilateral neural foraminal
stenosis at C4-C5.

Upper chest: Negative.

Other: None.
IMPRESSION: No acute intracranial abnormality.

Unchanged size of the ventricular system and position of right
frontal approach ventriculostomy catheter.

Increased size of a probable meningioma along the right posterior
temporal lobe measuring 1.6 x 1.2 cm, previously 0.8 x 0.9 cm.

No acute cervical spine fracture.

Multilevel degenerative disc disease and facet arthropathy. Large
posterior disc osteophyte complex at C4-C5 results in degree of
central spinal canal stenosis.
